# Patient Record
Sex: Female | Born: 1980 | Race: Black or African American | Hispanic: No | Smoking: Never smoker
Health system: Southern US, Community
[De-identification: ages and names within clinical notes are randomized; demographics above are authoritative.]

## PROBLEM LIST (undated history)

## (undated) ENCOUNTER — Inpatient Hospital Stay (HOSPITAL_COMMUNITY): Payer: Self-pay

## (undated) DIAGNOSIS — A539 Syphilis, unspecified: Secondary | ICD-10-CM

## (undated) DIAGNOSIS — N83209 Unspecified ovarian cyst, unspecified side: Secondary | ICD-10-CM

## (undated) DIAGNOSIS — B009 Herpesviral infection, unspecified: Secondary | ICD-10-CM

## (undated) DIAGNOSIS — K922 Gastrointestinal hemorrhage, unspecified: Secondary | ICD-10-CM

## (undated) DIAGNOSIS — J40 Bronchitis, not specified as acute or chronic: Secondary | ICD-10-CM

## (undated) DIAGNOSIS — R519 Headache, unspecified: Secondary | ICD-10-CM

## (undated) DIAGNOSIS — Z8619 Personal history of other infectious and parasitic diseases: Secondary | ICD-10-CM

## (undated) DIAGNOSIS — I10 Essential (primary) hypertension: Secondary | ICD-10-CM

## (undated) DIAGNOSIS — R011 Cardiac murmur, unspecified: Secondary | ICD-10-CM

## (undated) DIAGNOSIS — E119 Type 2 diabetes mellitus without complications: Secondary | ICD-10-CM

## (undated) DIAGNOSIS — Z98891 History of uterine scar from previous surgery: Secondary | ICD-10-CM

## (undated) DIAGNOSIS — O24419 Gestational diabetes mellitus in pregnancy, unspecified control: Secondary | ICD-10-CM

## (undated) DIAGNOSIS — R51 Headache: Secondary | ICD-10-CM

## (undated) DIAGNOSIS — Z8632 Personal history of gestational diabetes: Secondary | ICD-10-CM

## (undated) HISTORY — DX: Herpesviral infection, unspecified: B00.9

## (undated) HISTORY — PX: WISDOM TOOTH EXTRACTION: SHX21

## (undated) HISTORY — DX: Essential (primary) hypertension: I10

## (undated) HISTORY — PX: LIPOSUCTION: SHX10

## (undated) HISTORY — DX: History of uterine scar from previous surgery: Z98.891

## (undated) HISTORY — DX: Personal history of other infectious and parasitic diseases: Z86.19

## (undated) HISTORY — PX: COSMETIC SURGERY: SHX468

## (undated) HISTORY — PX: FOOT SURGERY: SHX648

## (undated) HISTORY — DX: Personal history of gestational diabetes: Z86.32

## (undated) HISTORY — PX: TONSILLECTOMY: SUR1361

---

## 2001-04-27 ENCOUNTER — Emergency Department (HOSPITAL_COMMUNITY): Admission: EM | Admit: 2001-04-27 | Discharge: 2001-04-27 | Payer: Self-pay | Admitting: *Deleted

## 2001-06-01 ENCOUNTER — Encounter: Payer: Self-pay | Admitting: *Deleted

## 2001-06-01 ENCOUNTER — Ambulatory Visit (HOSPITAL_COMMUNITY): Admission: RE | Admit: 2001-06-01 | Discharge: 2001-06-01 | Payer: Self-pay | Admitting: Neonatology

## 2005-08-04 ENCOUNTER — Observation Stay (HOSPITAL_COMMUNITY): Admission: AD | Admit: 2005-08-04 | Discharge: 2005-08-05 | Payer: Self-pay | Admitting: Obstetrics and Gynecology

## 2005-08-13 ENCOUNTER — Emergency Department (HOSPITAL_COMMUNITY): Admission: EM | Admit: 2005-08-13 | Discharge: 2005-08-13 | Payer: Self-pay | Admitting: Emergency Medicine

## 2005-08-16 ENCOUNTER — Observation Stay (HOSPITAL_COMMUNITY): Admission: AD | Admit: 2005-08-16 | Discharge: 2005-08-17 | Payer: Self-pay | Admitting: Obstetrics and Gynecology

## 2005-08-21 ENCOUNTER — Ambulatory Visit (HOSPITAL_COMMUNITY): Admission: AD | Admit: 2005-08-21 | Discharge: 2005-08-21 | Payer: Self-pay | Admitting: Obstetrics and Gynecology

## 2005-09-01 ENCOUNTER — Inpatient Hospital Stay (HOSPITAL_COMMUNITY): Admission: RE | Admit: 2005-09-01 | Discharge: 2005-09-03 | Payer: Self-pay | Admitting: Obstetrics and Gynecology

## 2005-09-08 ENCOUNTER — Emergency Department (HOSPITAL_COMMUNITY): Admission: EM | Admit: 2005-09-08 | Discharge: 2005-09-08 | Payer: Self-pay | Admitting: Emergency Medicine

## 2008-09-20 ENCOUNTER — Emergency Department: Payer: Self-pay | Admitting: Unknown Physician Specialty

## 2010-10-10 ENCOUNTER — Emergency Department: Payer: Self-pay | Admitting: Emergency Medicine

## 2011-03-23 ENCOUNTER — Emergency Department (HOSPITAL_COMMUNITY): Payer: Self-pay

## 2011-03-23 ENCOUNTER — Encounter: Payer: Self-pay | Admitting: *Deleted

## 2011-03-23 ENCOUNTER — Emergency Department (HOSPITAL_COMMUNITY)
Admission: EM | Admit: 2011-03-23 | Discharge: 2011-03-24 | Disposition: A | Discharge status: Home / Self Care | Payer: Self-pay | Attending: Emergency Medicine | Admitting: Emergency Medicine

## 2011-03-23 DIAGNOSIS — R058 Other specified cough: Secondary | ICD-10-CM

## 2011-03-23 DIAGNOSIS — N939 Abnormal uterine and vaginal bleeding, unspecified: Secondary | ICD-10-CM

## 2011-03-23 DIAGNOSIS — J45909 Unspecified asthma, uncomplicated: Secondary | ICD-10-CM | POA: Insufficient documentation

## 2011-03-23 DIAGNOSIS — R059 Cough, unspecified: Secondary | ICD-10-CM | POA: Insufficient documentation

## 2011-03-23 DIAGNOSIS — R05 Cough: Secondary | ICD-10-CM | POA: Insufficient documentation

## 2011-03-23 DIAGNOSIS — N898 Other specified noninflammatory disorders of vagina: Secondary | ICD-10-CM | POA: Insufficient documentation

## 2011-03-23 DIAGNOSIS — R109 Unspecified abdominal pain: Secondary | ICD-10-CM | POA: Insufficient documentation

## 2011-03-23 HISTORY — DX: Gastrointestinal hemorrhage, unspecified: K92.2

## 2011-03-23 LAB — URINE MICROSCOPIC-ADD ON

## 2011-03-23 LAB — BASIC METABOLIC PANEL
BUN: 7 mg/dL (ref 6–23)
CO2: 21 mEq/L (ref 19–32)
Calcium: 9.5 mg/dL (ref 8.4–10.5)
Chloride: 101 mEq/L (ref 96–112)
Creatinine, Ser: 0.69 mg/dL (ref 0.50–1.10)
GFR calc Af Amer: >60 mL/min (ref >60)
GFR calc non Af Amer: >60 mL/min (ref >60)
Glucose, Bld: 117 mg/dL — ABNORMAL HIGH (ref 70–99)
Potassium: 3.4 mEq/L — ABNORMAL LOW (ref 3.5–5.1)
Sodium: 135 mEq/L (ref 135–145)

## 2011-03-23 LAB — DIFFERENTIAL
Basophils Absolute: 0 10*3/uL (ref 0.0–0.1)
Basophils Relative: 1 % (ref 0–1)
Eosinophils Absolute: 0.2 10*3/uL (ref 0.0–0.7)
Eosinophils Relative: 6 % — ABNORMAL HIGH (ref 0–5)
Lymphocytes Relative: 37 % (ref 12–46)
Lymphs Abs: 1.5 10*3/uL (ref 0.7–4.0)
Monocytes Absolute: 0.2 10*3/uL (ref 0.1–1.0)
Monocytes Relative: 4 % (ref 3–12)
Neutro Abs: 2.1 10*3/uL (ref 1.7–7.7)
Neutrophils Relative %: 52 % (ref 43–77)

## 2011-03-23 LAB — WET PREP, GENITAL
Clue Cells Wet Prep HPF POC: NONE SEEN
Trich, Wet Prep: NONE SEEN
Yeast Wet Prep HPF POC: NONE SEEN

## 2011-03-23 LAB — URINALYSIS, ROUTINE W REFLEX MICROSCOPIC
Bilirubin Urine: NEGATIVE
Glucose, UA: NEGATIVE mg/dL
Ketones, ur: NEGATIVE mg/dL
Leukocytes, UA: NEGATIVE
Nitrite: NEGATIVE
Specific Gravity, Urine: >1.03 — ABNORMAL HIGH (ref 1.005–1.030)
Urobilinogen, UA: 0.2 mg/dL (ref 0.0–1.0)
pH: 5.5 (ref 5.0–8.0)

## 2011-03-23 LAB — CBC
HCT: 36.7 % (ref 36.0–46.0)
Hemoglobin: 12.1 g/dL (ref 12.0–15.0)
MCH: 30 pg (ref 26.0–34.0)
MCHC: 33 g/dL (ref 30.0–36.0)
MCV: 91.1 fL (ref 78.0–100.0)
Platelets: 245 10*3/uL (ref 150–400)
RBC: 4.03 MIL/uL (ref 3.87–5.11)
RDW: 13.8 % (ref 11.5–15.5)
WBC: 4 10*3/uL (ref 4.0–10.5)

## 2011-03-23 LAB — POCT PREGNANCY, URINE: Preg Test, Ur: NEGATIVE

## 2011-03-23 NOTE — ED Notes (Signed)
C/o cough since June; was seen and tx by PCP for same previously and dx with bronchitis; pt denies change in s/s; a&ox4; in no distress; respirations, even, non-labored

## 2011-03-24 MED ORDER — PREDNISONE 10 MG PO TABS: ORAL_TABLET | ORAL | Status: AC

## 2011-03-24 MED ORDER — ALBUTEROL SULFATE HFA 108 (90 BASE) MCG/ACT IN AERS: 2.0000 | INHALATION_SPRAY | RESPIRATORY_TRACT | Status: DC | PRN

## 2011-03-24 NOTE — ED Provider Notes (Signed)
History     CSN: [id number] Arrival date & time: 03/23/2011  7:22 PM  Chief Complaint  Patient presents with  . Cough  . Abdominal Pain  . Vaginal Bleeding   HPI Comments: Patient c/o persistent cough for more than one month.  States the cough is intermittently productive of slightly yellow muscus.  Took antibiotic at onset of symptoms w/o improvement.  She denies fever or shortness of breath.  She also c/o intermittent vagainal bleeding this month.  Describes the bleeding as similar to her normal period but has occurred three times in July.  States that she has an IUD in place that was due to be removed in February.  She denies vagainal d/c, new sexual partners or dysuria  Patient is a 30 y.o. female presenting with cough, abdominal pain, and vaginal bleeding. The history is provided by the patient.  Cough This is a chronic problem. The current episode started more than 1 week ago. The problem occurs constantly. The problem has not changed since onset.The cough is productive of sputum. There has been no fever. Pertinent negatives include no chest pain, no chills, no ear congestion, no ear pain, no headaches, no sore throat, no myalgias, no shortness of breath and no wheezing. Treatments tried: antibiotic. The treatment provided no relief. Her past medical history is significant for bronchitis and asthma. Her past medical history does not include pneumonia, COPD or emphysema.  Abdominal Pain The primary symptoms of the illness include abdominal pain and vaginal bleeding. The primary symptoms of the illness do not include fever, fatigue, shortness of breath, nausea, vomiting, diarrhea, dysuria or vaginal discharge.  Symptoms associated with the illness do not include chills or constipation.  Vaginal Bleeding This is a recurrent problem. The current episode started 1 to 4 weeks ago. The problem occurs intermittently. The problem has been unchanged. Associated symptoms include abdominal pain and  coughing. Pertinent negatives include no arthralgias, chest pain, chills, congestion, fatigue, fever, headaches, myalgias, nausea, neck pain, numbness, sore throat, vomiting or weakness. The symptoms are aggravated by nothing. She has tried nothing for the symptoms. The treatment provided no relief.    Past Medical History  Diagnosis Date  . GI bleeding   . Asthma     Past Surgical History  Procedure Date  . Tonsillectomy   . Cesarean section     History reviewed. No pertinent family history.  History  Substance Use Topics  . Smoking status: Never Smoker   . Smokeless tobacco: Not on file  . Alcohol Use: Yes     occasionally    OB History    Grav Para Term Preterm Abortions TAB SAB Ect Mult Living                  Review of Systems  Constitutional: Negative for fever, chills, appetite change and fatigue.  HENT: Negative for ear pain, congestion, sore throat, trouble swallowing, neck pain and neck stiffness.   Respiratory: Positive for cough. Negative for chest tightness, shortness of breath and wheezing.   Cardiovascular: Negative.  Negative for chest pain.  Gastrointestinal: Positive for abdominal pain. Negative for nausea, vomiting, diarrhea and constipation.  Genitourinary: Positive for vaginal bleeding. Negative for dysuria, flank pain, vaginal discharge, genital sores, vaginal pain and pelvic pain.  Musculoskeletal: Negative.  Negative for myalgias and arthralgias.  Skin: Negative.   Neurological: Negative for dizziness, facial asymmetry, weakness, numbness and headaches.  Hematological: Does not bruise/bleed easily.    Physical Exam  BP 121/68  Pulse 73  Temp(Src) 98 F (36.7 C) (Oral)  Resp 14  Ht 5' (1.524 m)  Wt 187 lb (84.823 kg)  BMI 36.52 kg/m2  SpO2 98%  LMP 03/22/2011  Physical Exam  Nursing note and vitals reviewed. Constitutional: She is oriented to person, place, and time. She appears well-developed and well-nourished. No distress.  HENT:    Head: Normocephalic and atraumatic.  Right Ear: External ear normal.  Left Ear: External ear normal.  Mouth/Throat: Oropharynx is clear and moist.  Eyes: EOM are normal. Pupils are equal, round, and reactive to light.  Neck: Normal range of motion. Neck supple. No thyromegaly present.  Cardiovascular: Normal rate, regular rhythm and normal heart sounds.   Pulmonary/Chest: Effort normal and breath sounds normal.  Abdominal: Soft. Bowel sounds are normal. She exhibits no distension and no mass. There is no tenderness. There is no rebound and no guarding.  Genitourinary: Uterus normal. Cervix exhibits no motion tenderness, no discharge and no friability. Right adnexum displays no mass and no tenderness. Left adnexum displays no mass and no tenderness. There is bleeding around the vagina. No tenderness around the vagina. No foreign body around the vagina. No signs of injury around the vagina. No vaginal discharge found.    Musculoskeletal: She exhibits no edema and no tenderness.  Lymphadenopathy:    She has no cervical adenopathy.  Neurological: She is alert and oriented to person, place, and time. She exhibits normal muscle tone. Coordination normal.  Skin: Skin is warm and dry.  Psychiatric: She has a normal mood and affect.    ED Course  Procedures  MDM   0005  Patient is stable, NAD.  Abd remains soft, NT on exam.  Intermittent vaginal bleeding w/o significant blood loss.  Vitals stable.  Urine, GC and chlamydia cultures are pending.  She is pass due for removal of her IUD.  I have reviewed the lab results and CXR and discussed them with the patient, she agrees to f/u with Dr. Emelda Fear regarding the IUD removal.    Results for orders placed during the hospital encounter of 03/23/11  CBC      Component Value Range   WBC 4.0  4.0 - 10.5 (K/uL)   RBC 4.03  3.87 - 5.11 (MIL/uL)   Hemoglobin 12.1  12.0 - 15.0 (g/dL)   HCT 16.1  09.6 - 04.5 (%)   MCV 91.1  78.0 - 100.0 (fL)   MCH  30.0  26.0 - 34.0 (pg)   MCHC 33.0  30.0 - 36.0 (g/dL)   RDW 40.9  81.1 - 91.4 (%)   Platelets 245  150 - 400 (K/uL)  DIFFERENTIAL      Component Value Range   Neutrophils Relative 52  43 - 77 (%)   Neutro Abs 2.1  1.7 - 7.7 (K/uL)   Lymphocytes Relative 37  12 - 46 (%)   Lymphs Abs 1.5  0.7 - 4.0 (K/uL)   Monocytes Relative 4  3 - 12 (%)   Monocytes Absolute 0.2  0.1 - 1.0 (K/uL)   Eosinophils Relative 6 (*) 0 - 5 (%)   Eosinophils Absolute 0.2  0.0 - 0.7 (K/uL)   Basophils Relative 1  0 - 1 (%)   Basophils Absolute 0.0  0.0 - 0.1 (K/uL)  BASIC METABOLIC PANEL      Component Value Range   Sodium 135  135 - 145 (mEq/L)   Potassium 3.4 (*) 3.5 - 5.1 (mEq/L)   Chloride 101  96 - 112 (  mEq/L)   CO2 21  19 - 32 (mEq/L)   Glucose, Bld 117 (*) 70 - 99 (mg/dL)   BUN 7  6 - 23 (mg/dL)   Creatinine, Ser 1.19  0.50 - 1.10 (mg/dL)   Calcium 9.5  8.4 - 14.7 (mg/dL)   GFR calc non Af Amer >60  >60 (mL/min)   GFR calc Af Amer >60  >60 (mL/min)  URINALYSIS, ROUTINE W REFLEX MICROSCOPIC      Component Value Range   Color, Urine YELLOW  YELLOW    Appearance HAZY (*) CLEAR    Specific Gravity, Urine >1.030 (*) 1.005 - 1.030    pH 5.5  5.0 - 8.0    Glucose, UA NEGATIVE  NEGATIVE (mg/dL)   Hgb urine dipstick LARGE (*) NEGATIVE    Bilirubin Urine NEGATIVE  NEGATIVE    Ketones, ur NEGATIVE  NEGATIVE (mg/dL)   Protein, ur TRACE (*) NEGATIVE (mg/dL)   Urobilinogen, UA 0.2  0.0 - 1.0 (mg/dL)   Nitrite NEGATIVE  NEGATIVE    Leukocytes, UA NEGATIVE  NEGATIVE   POCT PREGNANCY, URINE      Component Value Range   Preg Test, Ur NEGATIVE    URINE MICROSCOPIC-ADD ON      Component Value Range   Squamous Epithelial / LPF MANY (*) RARE    WBC, UA 0-2  <3 (WBC/hpf)   RBC / HPF 7-10  <3 (RBC/hpf)   Bacteria, UA MANY (*) RARE   WET PREP, GENITAL      Component Value Range   Yeast, Wet Prep NONE SEEN  NONE SEEN    Trich, Wet Prep NONE SEEN  NONE SEEN    Clue Cells, Wet Prep NONE SEEN  NONE SEEN     WBC, Wet Prep HPF POC FEW (*) NONE SEEN      Dg Chest 2 View  03/23/2011  *RADIOLOGY REPORT*  Clinical Data: Cough  CHEST - 2 VIEW  Comparison: None.  Findings: Heart size is upper normal.  Negative for heart failure. Lungs are clear without infiltrate or effusion.  IMPRESSION: No active cardiopulmonary disease.  Original Report Authenticated By: Camelia Phenes, M.D.         Azariyah Luhrs L. [place], [place] 03/24/11 [phone]

## 2011-03-24 NOTE — ED Notes (Signed)
No needs voiced at this time. Pt reports a soreness in chest from coughing is the only discomfort at this time.

## 2011-03-25 LAB — GC/CHLAMYDIA PROBE AMP, GENITAL
Chlamydia, DNA Probe: NEGATIVE
GC Probe Amp, Genital: NEGATIVE

## 2011-03-25 LAB — URINE CULTURE
Colony Count: 30000
Culture  Setup Time: 201208021439

## 2011-04-01 NOTE — ED Provider Notes (Signed)
History     CSN: [id number] Arrival date & time: 03/23/2011  7:22 PM  Chief Complaint  Patient presents with  . Cough  . Abdominal Pain  . Vaginal Bleeding   HPI  Past Medical History  Diagnosis Date  . GI bleeding   . Asthma     Past Surgical History  Procedure Date  . Tonsillectomy   . Cesarean section     History reviewed. No pertinent family history.  History  Substance Use Topics  . Smoking status: Never Smoker   . Smokeless tobacco: Not on file  . Alcohol Use: Yes     occasionally    OB History    Grav Para Term Preterm Abortions TAB SAB Ect Mult Living                  Review of Systems  Physical Exam  BP 121/68  Pulse 73  Temp(Src) 98 F (36.7 C) (Oral)  Resp 14  Ht 5' (1.524 m)  Wt 187 lb (84.823 kg)  BMI 36.52 kg/m2  SpO2 98%  LMP 03/22/2011  Physical Exam  ED Course  Procedures  MDM No att. providers found   No att. providers found The patient was evaluated in my presence.   I was available for consultation at all times  Doug Sou, MD 04/01/11 1502

## 2011-04-16 NOTE — ED Provider Notes (Signed)
Medical screening examination/treatment/procedure(s) were performed by non-physician practitioner and as supervising physician I was immediately available for consultation/collaboration.  Doug Sou, MD 04/16/11 [phone]

## 2011-11-11 ENCOUNTER — Other Ambulatory Visit (HOSPITAL_COMMUNITY)
Admission: RE | Admit: 2011-11-11 | Discharge: 2011-11-11 | Disposition: A | Discharge status: Home / Self Care | Payer: Self-pay | Source: Ambulatory Visit | Attending: Obstetrics & Gynecology | Admitting: Obstetrics & Gynecology

## 2011-11-11 DIAGNOSIS — Z01419 Encounter for gynecological examination (general) (routine) without abnormal findings: Secondary | ICD-10-CM | POA: Insufficient documentation

## 2012-02-09 ENCOUNTER — Emergency Department (HOSPITAL_COMMUNITY)
Admission: EM | Admit: 2012-02-09 | Discharge: 2012-02-09 | Disposition: A | Discharge status: Home / Self Care | Payer: PRIVATE HEALTH INSURANCE | Attending: Emergency Medicine | Admitting: Emergency Medicine

## 2012-02-09 ENCOUNTER — Encounter (HOSPITAL_COMMUNITY): Payer: Self-pay | Admitting: Emergency Medicine

## 2012-02-09 DIAGNOSIS — J45909 Unspecified asthma, uncomplicated: Secondary | ICD-10-CM | POA: Insufficient documentation

## 2012-02-09 DIAGNOSIS — L03019 Cellulitis of unspecified finger: Secondary | ICD-10-CM | POA: Insufficient documentation

## 2012-02-09 DIAGNOSIS — R030 Elevated blood-pressure reading, without diagnosis of hypertension: Secondary | ICD-10-CM | POA: Insufficient documentation

## 2012-02-09 DIAGNOSIS — L02519 Cutaneous abscess of unspecified hand: Secondary | ICD-10-CM | POA: Insufficient documentation

## 2012-02-09 DIAGNOSIS — I10 Essential (primary) hypertension: Secondary | ICD-10-CM

## 2012-02-09 DIAGNOSIS — Z91040 Latex allergy status: Secondary | ICD-10-CM | POA: Insufficient documentation

## 2012-02-09 MED ORDER — IBUPROFEN 800 MG PO TABS
800.0000 mg | ORAL_TABLET | Freq: Once | ORAL | Status: AC
  Administered 2012-02-09: 800 mg via ORAL
  Filled 2012-02-09: qty 1

## 2012-02-09 MED ORDER — AMOXICILLIN-POT CLAVULANATE 875-125 MG PO TABS: 1.0000 | ORAL_TABLET | Freq: Two times a day (BID) | ORAL | Status: AC

## 2012-02-09 MED ORDER — AMOXICILLIN-POT CLAVULANATE 875-125 MG PO TABS
1.0000 | ORAL_TABLET | Freq: Once | ORAL | Status: AC
  Administered 2012-02-09: 1 via ORAL
  Filled 2012-02-09: qty 1

## 2012-02-09 MED ORDER — HYDROCHLOROTHIAZIDE 25 MG PO TABS: 25.0000 mg | ORAL_TABLET | Freq: Every day | ORAL | Status: DC

## 2012-02-09 MED ORDER — MELOXICAM 7.5 MG PO TABS: ORAL_TABLET | ORAL | Status: DC

## 2012-02-09 MED ORDER — MINOCYCLINE HCL 100 MG PO CAPS: 100.0000 mg | ORAL_CAPSULE | Freq: Two times a day (BID) | ORAL | Status: AC

## 2012-02-09 MED ORDER — ONDANSETRON HCL 4 MG PO TABS
4.0000 mg | ORAL_TABLET | Freq: Once | ORAL | Status: AC
  Administered 2012-02-09: 4 mg via ORAL
  Filled 2012-02-09: qty 1

## 2012-02-09 MED ORDER — HYDROCODONE-ACETAMINOPHEN 7.5-325 MG PO TABS: 1.0000 | ORAL_TABLET | ORAL | Status: AC | PRN

## 2012-02-09 MED ORDER — DOXYCYCLINE HYCLATE 100 MG PO TABS
100.0000 mg | ORAL_TABLET | Freq: Once | ORAL | Status: AC
  Administered 2012-02-09: 100 mg via ORAL
  Filled 2012-02-09: qty 1

## 2012-02-09 NOTE — ED Notes (Signed)
Pt complaining of right hand and arm swelling. Redness noted to right 5th metacarpal.

## 2012-02-09 NOTE — ED Notes (Signed)
Pt alert & oriented x4, stable gait. Pt given discharge instructions, paperwork & prescription(s). Patient instructed to stop at the registration desk to finish any additional paperwork. pt verbalized understanding. Pt left department w/ no further questions.  

## 2012-02-09 NOTE — ED Provider Notes (Signed)
History     CSN: [id number]  Arrival date & time 02/09/12  1846   First MD Initiated Contact with Patient 02/09/12 1957      Chief Complaint  Patient presents with  . Arm Swelling    (Consider location/radiation/quality/duration/timing/severity/associated sxs/prior treatment) HPI Comments: Patient states she had a lesion on the right forearm approximately 2-3 weeks ago. She states she tried home remedies and it gradually went away. A second area came up above that first area that also went away pretty much on its own. In the past week she has noted a reddened area on the right fifth finger that is gotten a little worse than when it first came up. It is now painful and she has some pain that is going down the right arm. She has not had fever or chills. She is not dropping objects or having problems controlling her pain. But she does have some pain and discomfort.  The history is provided by the patient.    Past Medical History  Diagnosis Date  . GI bleeding   . Asthma     Past Surgical History  Procedure Date  . Tonsillectomy   . Cesarean section   . Foot surgery     History reviewed. No pertinent family history.  History  Substance Use Topics  . Smoking status: Never Smoker   . Smokeless tobacco: Not on file  . Alcohol Use: Yes     occasionally    OB History    Grav Para Term Preterm Abortions TAB SAB Ect Mult Living                  Review of Systems  Constitutional: Negative for activity change.       All ROS Neg except as noted in HPI  HENT: Negative for nosebleeds and neck pain.   Eyes: Negative for photophobia and discharge.  Respiratory: Positive for wheezing. Negative for cough and shortness of breath.   Cardiovascular: Negative for chest pain and palpitations.  Gastrointestinal: Negative for abdominal pain and blood in stool.  Genitourinary: Negative for dysuria, frequency and hematuria.  Musculoskeletal: Negative for back pain and arthralgias.    Skin: Negative.   Neurological: Negative for dizziness, seizures and speech difficulty.  Psychiatric/Behavioral: Negative for hallucinations and confusion.    Allergies  Latex  Home Medications   Current Outpatient Rx  Name Route Sig Dispense Refill  . AMOXICILLIN-POT CLAVULANATE 875-125 MG PO TABS Oral Take 1 tablet by mouth 2 (two) times daily. 14 tablet 0  . HYDROCODONE-ACETAMINOPHEN 7.5-325 MG PO TABS Oral Take 1 tablet by mouth every 4 (four) hours as needed for pain. 20 tablet 0  . MELOXICAM 7.5 MG PO TABS  1 po bid with food 12 tablet 0  . MINOCYCLINE HCL 100 MG PO CAPS Oral Take 1 capsule (100 mg total) by mouth 2 (two) times daily. 14 capsule 0    BP 171/104  Pulse 91  Temp 98.2 F (36.8 C) (Oral)  Resp 20  Ht 5' (1.524 m)  Wt 192 lb 14.4 oz (87.499 kg)  BMI 37.67 kg/m2  SpO2 100%  LMP 01/29/2012  Physical Exam  Nursing note and vitals reviewed. Constitutional: She is oriented to person, place, and time. She appears well-developed and well-nourished.  Non-toxic appearance.  HENT:  Head: Normocephalic.  Right Ear: Tympanic membrane and external ear normal.  Left Ear: Tympanic membrane and external ear normal.  Eyes: EOM and lids are normal. Pupils are equal, round, and reactive  to light.  Neck: Normal range of motion. Neck supple. Carotid bruit is not present.  Cardiovascular: Normal rate, regular rhythm, normal heart sounds, intact distal pulses and normal pulses.   Pulmonary/Chest: Breath sounds normal. No respiratory distress.  Abdominal: Soft. Bowel sounds are normal. There is no tenderness. There is no guarding.  Musculoskeletal: Normal range of motion.       There is a small abscess with increased redness in the medial aspect of the right fifth finger.  there is pain with range of motion of the right fifth finger. There is soreness with range of motion of the right wrist, particularly pronation and supination. There are well-healed lesions of the mid forearm  on the right. There no palpable nodes in the biceps triceps area, or the axilla. The right arm is warm but not hot.  Lymphadenopathy:       Head (right side): No submandibular adenopathy present.       Head (left side): No submandibular adenopathy present.    She has no cervical adenopathy.  Neurological: She is alert and oriented to person, place, and time. She has normal strength. No cranial nerve deficit or sensory deficit.  Skin: Skin is warm and dry.  Psychiatric: She has a normal mood and affect. Her speech is normal.    ED Course  Procedures (including critical care time)  Labs Reviewed - No data to display No results found.   1. Cellulitis and abscess of finger       MDM  I have reviewed nursing notes, vital signs, and all appropriate lab and imaging results for this patient. Patient has been having problems with abscess areas involving the right hand also on the lower abdomen for the past 3 weeks. She now has one on the right hand this seems to be slow to go away. The patient's vital signs are within normal limits with exception of the blood pressure being elevated at 171/104. Patient treated with Augmentin 875 mg, Norco 7.5 mg for pain #20, Mobic 7.5 mg and Minocin 100 mg. The patient states she has had problems with her blood pressure in the past, she is not on any medication at this time. She has been checking her blood pressure over the past week and it has been elevated. The blood pressure remains elevated tonight 171/104. Patient given a prescription for hydrochlorothiazide 25 mg with instructions to recheck her pressure on tomorrow and to use the medication if she is still elevated. The patient is strongly advised to see her primary physician for management of the blood pressure issue. The patient's mother advised to see her primary physician in the next 2-3 days for recheck of the right hand. She's advised to return to the emergency department if she is unable to see her  primary physician.       Kathie Dike, [place] 02/09/12 2032

## 2012-02-09 NOTE — ED Notes (Signed)
Pt reports a bite two or three days ago. Now finger red & swollen. States had a similar area to the right forearm 2 weeks ago

## 2012-02-09 NOTE — ED Provider Notes (Signed)
Medical screening examination/treatment/procedure(s) were performed by non-physician practitioner and as supervising physician I was immediately available for consultation/collaboration.   Dayton Bailiff, MD 02/09/12 2042

## 2012-02-09 NOTE — Discharge Instructions (Signed)
Please soak the right hand and warm salt water 2 times daily until the abscess and cellulitis resolved. Please use Augmentin, mobic, and minocin   2 times daily with food. Use Norco for pain if needed. This medication may cause drowsiness and constipation, use with caution. Please see your Dr. in 3-4 days for recheck. If you cannot be seen by your Dr. please return to the emergency department for evaluation of your hand. Please return sooner if any fever, additional swelling, or other concerns.

## 2012-09-15 ENCOUNTER — Emergency Department: Payer: Self-pay | Admitting: Emergency Medicine

## 2012-09-24 ENCOUNTER — Encounter (HOSPITAL_COMMUNITY): Payer: Self-pay | Admitting: Emergency Medicine

## 2012-09-24 ENCOUNTER — Emergency Department (HOSPITAL_COMMUNITY)
Admission: EM | Admit: 2012-09-24 | Discharge: 2012-09-24 | Disposition: A | Discharge status: Home / Self Care | Payer: PRIVATE HEALTH INSURANCE | Attending: Emergency Medicine | Admitting: Emergency Medicine

## 2012-09-24 ENCOUNTER — Emergency Department (HOSPITAL_COMMUNITY): Payer: PRIVATE HEALTH INSURANCE

## 2012-09-24 DIAGNOSIS — J40 Bronchitis, not specified as acute or chronic: Secondary | ICD-10-CM | POA: Insufficient documentation

## 2012-09-24 DIAGNOSIS — K922 Gastrointestinal hemorrhage, unspecified: Secondary | ICD-10-CM | POA: Insufficient documentation

## 2012-09-24 DIAGNOSIS — J45909 Unspecified asthma, uncomplicated: Secondary | ICD-10-CM | POA: Insufficient documentation

## 2012-09-24 DIAGNOSIS — J9801 Acute bronchospasm: Secondary | ICD-10-CM | POA: Insufficient documentation

## 2012-09-24 HISTORY — DX: Bronchitis, not specified as acute or chronic: J40

## 2012-09-24 MED ORDER — ALBUTEROL SULFATE (5 MG/ML) 0.5% IN NEBU
5.0000 mg | INHALATION_SOLUTION | Freq: Once | RESPIRATORY_TRACT | Status: AC
  Administered 2012-09-24: 5 mg via RESPIRATORY_TRACT
  Filled 2012-09-24: qty 1

## 2012-09-24 MED ORDER — PREDNISONE 10 MG PO TABS: 20.0000 mg | ORAL_TABLET | Freq: Every day | ORAL | Status: DC

## 2012-09-24 MED ORDER — IPRATROPIUM BROMIDE 0.02 % IN SOLN
0.5000 mg | Freq: Once | RESPIRATORY_TRACT | Status: AC
  Administered 2012-09-24: 0.5 mg via RESPIRATORY_TRACT
  Filled 2012-09-24: qty 2.5

## 2012-09-24 MED ORDER — AMOXICILLIN 500 MG PO CAPS: 500.0000 mg | ORAL_CAPSULE | Freq: Three times a day (TID) | ORAL | Status: DC

## 2012-09-24 MED ORDER — FLUCONAZOLE 200 MG PO TABS: ORAL_TABLET | ORAL | Status: DC

## 2012-09-24 MED ORDER — PREDNISONE 10 MG PO TABS
60.0000 mg | ORAL_TABLET | Freq: Once | ORAL | Status: AC
  Administered 2012-09-24: 60 mg via ORAL
  Filled 2012-09-24: qty 1

## 2012-09-24 NOTE — ED Provider Notes (Signed)
History  This chart was scribed for Tina Lennert, MD by Ardeen Jourdain, ED Scribe. This patient was seen in room APA12/APA12 and the patient's care was started at 1012.  CSN: [id number]  Arrival date & time 09/24/12  0921   First MD Initiated Contact with Patient 09/24/12 1012      Chief Complaint  Patient presents with  . Shortness of Breath     Patient is a 32 y.o. female presenting with shortness of breath. The history is provided by the patient. No language interpreter was used.  Shortness of Breath  The current episode started yesterday. Associated symptoms include a fever, cough and shortness of breath. Pertinent negatives include no chest pain. The cough is non-productive. There was no intake of a foreign body. She was not exposed to toxic fumes. She has not inhaled smoke recently. She has had prior hospitalizations. She has had no prior ICU admissions. She has had no prior intubations. Her past medical history is significant for asthma. Her past medical history does not include bronchiolitis. She has been behaving normally.    Tina Wall is a 32 y.o. female who presents to the Emergency Department complaining of SOB. She states she was seen at [facility] last week for a recurrent cough. She states she was diagnosed with bronchitis. She reports being given a steroid, antibiotic and inhaler which she finished as prescribed. She reports using her inhaler at home with no relief.     Past Medical History  Diagnosis Date  . GI bleeding   . Asthma   . Bronchitis     Past Surgical History  Procedure Date  . Tonsillectomy   . Cesarean section   . Foot surgery     Family History  Problem Relation Age of Onset  . Cancer Other   . Diabetes Other     History  Substance Use Topics  . Smoking status: Never Smoker   . Smokeless tobacco: Never Used  . Alcohol Use: Yes     Comment: occasionally    OB History    Grav Para Term Preterm Abortions TAB SAB Ect Mult Living    3 3 3       3       Review of Systems  Constitutional: Positive for fever. Negative for fatigue.  HENT: Negative for congestion, sinus pressure and ear discharge.   Eyes: Negative for discharge.  Respiratory: Positive for cough and shortness of breath.   Cardiovascular: Negative for chest pain.  Gastrointestinal: Negative for abdominal pain and diarrhea.  Genitourinary: Negative for frequency and hematuria.  Musculoskeletal: Negative for back pain.  Skin: Negative for rash.  Neurological: Negative for seizures and headaches.  Hematological: Negative.   Psychiatric/Behavioral: Negative for hallucinations.  All other systems reviewed and are negative.    Allergies  Latex  Home Medications   Current Outpatient Rx  Name  Route  Sig  Dispense  Refill  . HYDROCHLOROTHIAZIDE 25 MG PO TABS   Oral   Take 1 tablet (25 mg total) by mouth daily.   30 tablet   0   . MELOXICAM 7.5 MG PO TABS      1 po bid with food   12 tablet   0     Triage Vitals: BP 142/84  Pulse 101  Temp 98 F (36.7 C)  Resp 18  Ht 5' (1.524 m)  Wt 194 lb (87.998 kg)  BMI 37.89 kg/m2  SpO2 100%  LMP 09/08/2012  Physical Exam  Nursing  note and vitals reviewed. Constitutional: She is oriented to person, place, and time. She appears well-developed and well-nourished. No distress.  HENT:  Head: Normocephalic and atraumatic.  Eyes: Conjunctivae normal and EOM are normal. No scleral icterus.  Neck: Neck supple. No thyromegaly present.  Cardiovascular: Normal rate, regular rhythm and normal heart sounds.  Exam reveals no gallop and no friction rub.   No murmur heard. Pulmonary/Chest: Effort normal. No stridor. She has wheezes. She has no rales. She exhibits no tenderness.       Moderate wheezes bilaterally   Abdominal: Soft. Bowel sounds are normal. She exhibits no distension. There is no tenderness. There is no rebound.  Musculoskeletal: Normal range of motion. She exhibits no edema.   Lymphadenopathy:    She has no cervical adenopathy.  Neurological: She is oriented to person, place, and time. Coordination normal.  Skin: No rash noted. No erythema.  Psychiatric: She has a normal mood and affect. Her behavior is normal.    ED Course  Procedures (including critical care time)  DIAGNOSTIC STUDIES: Oxygen Saturation is 100% on room air, normal by my interpretation.    COORDINATION OF CARE:  10:14 AM: Discussed treatment plan which includes CXR, albuterol and prednisone with pt at bedside and pt agreed to plan.     Results for orders placed during the hospital encounter of 03/23/11  CBC      Component Value Range   WBC 4.0  4.0 - 10.5 K/uL   RBC 4.03  3.87 - 5.11 MIL/uL   Hemoglobin 12.1  12.0 - 15.0 g/dL   HCT 16.1  09.6 - 04.5 %   MCV 91.1  78.0 - 100.0 fL   MCH 30.0  26.0 - 34.0 pg   MCHC 33.0  30.0 - 36.0 g/dL   RDW 40.9  81.1 - 91.4 %   Platelets 245  150 - 400 K/uL  DIFFERENTIAL      Component Value Range   Neutrophils Relative 52  43 - 77 %   Neutro Abs 2.1  1.7 - 7.7 K/uL   Lymphocytes Relative 37  12 - 46 %   Lymphs Abs 1.5  0.7 - 4.0 K/uL   Monocytes Relative 4  3 - 12 %   Monocytes Absolute 0.2  0.1 - 1.0 K/uL   Eosinophils Relative 6 (*) 0 - 5 %   Eosinophils Absolute 0.2  0.0 - 0.7 K/uL   Basophils Relative 1  0 - 1 %   Basophils Absolute 0.0  0.0 - 0.1 K/uL  BASIC METABOLIC PANEL      Component Value Range   Sodium 135  135 - 145 mEq/L   Potassium 3.4 (*) 3.5 - 5.1 mEq/L   Chloride 101  96 - 112 mEq/L   CO2 21  19 - 32 mEq/L   Glucose, Bld 117 (*) 70 - 99 mg/dL   BUN 7  6 - 23 mg/dL   Creatinine, Ser 7.82  0.50 - 1.10 mg/dL   Calcium 9.5  8.4 - 95.6 mg/dL   GFR calc non Af Amer >60  >60 mL/min   GFR calc Af Amer >60  >60 mL/min  URINALYSIS, ROUTINE W REFLEX MICROSCOPIC      Component Value Range   Color, Urine YELLOW  YELLOW   APPearance HAZY (*) CLEAR   Specific Gravity, Urine >1.030 (*) 1.005 - 1.030   pH 5.5  5.0 - 8.0    Glucose, UA NEGATIVE  NEGATIVE mg/dL   Hgb urine dipstick  LARGE (*) NEGATIVE   Bilirubin Urine NEGATIVE  NEGATIVE   Ketones, ur NEGATIVE  NEGATIVE mg/dL   Protein, ur TRACE (*) NEGATIVE mg/dL   Urobilinogen, UA 0.2  0.0 - 1.0 mg/dL   Nitrite NEGATIVE  NEGATIVE   Leukocytes, UA NEGATIVE  NEGATIVE  POCT PREGNANCY, URINE      Component Value Range   Preg Test, Ur NEGATIVE    URINE MICROSCOPIC-ADD ON      Component Value Range   Squamous Epithelial / LPF MANY (*) RARE   WBC, UA 0-2  <3 WBC/hpf   RBC / HPF 7-10  <3 RBC/hpf   Bacteria, UA MANY (*) RARE  URINE CULTURE      Component Value Range   Specimen Description URINE, CLEAN CATCH     Special Requests NONE     Culture  Setup Time [id number]     Colony Count 30,000 COLONIES/ML     Culture       Value: Multiple bacterial morphotypes present, none predominant. Suggest appropriate recollection if clinically indicated.   Report Status 03/25/2011 FINAL    GC/CHLAMYDIA PROBE AMP, GENITAL      Component Value Range   GC Probe Amp, Genital NEGATIVE  NEGATIVE   Chlamydia, DNA Probe NEGATIVE  NEGATIVE  WET PREP, GENITAL      Component Value Range   Yeast Wet Prep HPF POC NONE SEEN  NONE SEEN   Trich, Wet Prep NONE SEEN  NONE SEEN   Clue Cells Wet Prep HPF POC NONE SEEN  NONE SEEN   WBC, Wet Prep HPF POC FEW (*) NONE SEEN   Dg Chest 2 View  09/24/2012  *RADIOLOGY REPORT*  Clinical Data: Cough, shortness of breath.  CHEST - 2 VIEW  Comparison: March 23, 2011.  Findings: Cardiomediastinal silhouette appears normal.  No acute pulmonary disease is noted.  Bony thorax is intact.  IMPRESSION: No acute cardiopulmonary abnormality seen.   Original Report Authenticated By: Lupita Raider.,  M.D.        No diagnosis found.    MDM        The chart was scribed for me under my direct supervision.  I personally performed the history, physical, and medical decision making and all procedures in the evaluation of this  patient.Marland Kitchen }   Tina Lennert, MD 09/25/12 1258

## 2012-09-24 NOTE — ED Notes (Signed)
Patient c/o shortness of breath since last night. Per patient hx asthma. Patient reports being seen at [place] [facility] last week for a cough she has had for a month and diagnosed with bronchitis. Per patient given a steroid, antibiotic and inhaler. Reports using inhaler before coming here with no relief.

## 2012-09-24 NOTE — ED Notes (Signed)
Respiratory called for breathing treatment.

## 2012-12-31 ENCOUNTER — Encounter: Payer: Self-pay | Admitting: *Deleted

## 2012-12-31 DIAGNOSIS — B009 Herpesviral infection, unspecified: Secondary | ICD-10-CM

## 2013-01-01 ENCOUNTER — Encounter: Payer: Self-pay | Admitting: *Deleted

## 2013-01-01 ENCOUNTER — Ambulatory Visit: Payer: Self-pay | Admitting: Obstetrics & Gynecology

## 2014-02-11 ENCOUNTER — Ambulatory Visit: Payer: Self-pay | Admitting: Hematology and Oncology

## 2014-02-11 LAB — CBC CANCER CENTER
Basophil #: 0.1 x10 3/mm (ref 0.0–0.1)
Basophil %: 1.4 %
Eosinophil #: 0.2 x10 3/mm (ref 0.0–0.7)
Eosinophil %: 4.1 %
HCT: 40 % (ref 35.0–47.0)
HGB: 12.9 g/dL (ref 12.0–16.0)
Lymphocyte #: 2.6 x10 3/mm (ref 1.0–3.6)
Lymphocyte %: 62.6 %
MCH: 30.7 pg (ref 26.0–34.0)
MCHC: 32.2 g/dL (ref 32.0–36.0)
MCV: 95 fL (ref 80–100)
Monocyte #: 0.3 x10 3/mm (ref 0.2–0.9)
Monocyte %: 7.1 %
Neutrophil #: 1 x10 3/mm — ABNORMAL LOW (ref 1.4–6.5)
Neutrophil %: 24.8 %
Platelet: 198 x10 3/mm (ref 150–440)
RBC: 4.2 10*6/uL (ref 3.80–5.20)
RDW: 13.3 % (ref 11.5–14.5)
WBC: 4.2 x10 3/mm (ref 3.6–11.0)

## 2014-02-11 LAB — FOLATE: Folic Acid: 7 ng/mL (ref 3.1–100.0)

## 2014-02-19 ENCOUNTER — Ambulatory Visit: Payer: Self-pay | Admitting: Hematology and Oncology

## 2014-06-23 ENCOUNTER — Encounter: Payer: Self-pay | Admitting: *Deleted

## 2014-10-05 ENCOUNTER — Emergency Department: Payer: Self-pay | Admitting: Emergency Medicine

## 2014-10-22 ENCOUNTER — Ambulatory Visit: Payer: Self-pay | Admitting: Obstetrics & Gynecology

## 2014-10-22 ENCOUNTER — Encounter: Payer: Self-pay | Admitting: *Deleted

## 2015-04-02 ENCOUNTER — Ambulatory Visit (INDEPENDENT_AMBULATORY_CARE_PROVIDER_SITE_OTHER): Payer: PRIVATE HEALTH INSURANCE | Admitting: Primary Care

## 2015-04-02 ENCOUNTER — Encounter: Payer: Self-pay | Admitting: Primary Care

## 2015-04-02 ENCOUNTER — Encounter (INDEPENDENT_AMBULATORY_CARE_PROVIDER_SITE_OTHER): Payer: Self-pay

## 2015-04-02 VITALS — BP 146/94 | HR 75 | Temp 98.2°F | Ht 60.5 in | Wt 209.1 lb

## 2015-04-02 DIAGNOSIS — R7309 Other abnormal glucose: Secondary | ICD-10-CM | POA: Diagnosis not present

## 2015-04-02 DIAGNOSIS — R7303 Prediabetes: Secondary | ICD-10-CM

## 2015-04-02 DIAGNOSIS — E1165 Type 2 diabetes mellitus with hyperglycemia: Secondary | ICD-10-CM | POA: Insufficient documentation

## 2015-04-02 DIAGNOSIS — I10 Essential (primary) hypertension: Secondary | ICD-10-CM

## 2015-04-02 DIAGNOSIS — N949 Unspecified condition associated with female genital organs and menstrual cycle: Secondary | ICD-10-CM | POA: Diagnosis not present

## 2015-04-02 LAB — POCT URINALYSIS DIPSTICK
Bilirubin, UA: NEGATIVE
Blood, UA: NEGATIVE
Clarity, UA: NEGATIVE
Glucose, UA: NEGATIVE
Ketones, UA: NEGATIVE
Leukocytes, UA: NEGATIVE
Nitrite, UA: NEGATIVE
Protein, UA: NEGATIVE
Spec Grav, UA: >=1.03
Urobilinogen, UA: NEGATIVE
pH, UA: 5.5

## 2015-04-02 NOTE — Progress Notes (Signed)
Pre visit review using our clinic review tool, if applicable. No additional management support is needed unless otherwise documented below in the visit note. 

## 2015-04-02 NOTE — Patient Instructions (Addendum)
Complete lab work prior to leaving today. I will notify you of your results.  Continue to take the Losartan for the moment. I will call you once I receive the labs which will determine the BP medication.  It is important that you improve your diet. Please limit carbohydrates in the form of white bread, rice, pasta, cakes, cookies, sugary drinks, etc. Increase your consumption of fresh fruits and vegetables. Be sure to drink plenty of water daily.  It was a pleasure to meet you today! Please don't hesitate to call me with any questions. Welcome to [facility]!

## 2015-04-02 NOTE — Progress Notes (Signed)
Subjective:    Patient ID: Tina Wall, female    DOB: 1980-10-14, 34 y.o.   MRN: [id number]  HPI  Tina Wall is a 34 year old female who presents today to establish care and discuss the problems mentioned below. Will obtain old records.  1) Essential Hypertension: Diagnosed 1-2 years ago. She was once managed on losartan and was recently changed to lostartan/HCTZ 100/12.5 in late June. Since starting the new medication she experienced bilateral leg cramping that was bothersome. She ran out of the Hyzaar 1 week ago and has been taking losartan 100 mg. She checks her BP at home and has been getting 140's/90's. She reports headaches. Denies chest pain, shortness of breath, dizziness.  2) Vaginal Discomfort: Diagnosed with BV and was prescribed flagyl 2 weeks ago. She then got a yeast infection from the antibiotics and was provided with diflucan. Since then she continues to feel discomfort. Denies itching, vaginal discharge. She also reports right groin/ovarian pain. She was once told she had a fibroid. Denies bleeding other than cycle.   3) Borderline Diabetes: Diagnosed 1 year ago and is managed on Metformin 500 mg once daily. Last A1C was in late June and is not and is not aware of her results. She has some numbness/tingling to her fingers.   She endorses a poor diet which consists of:  Breakfast: Skips Lunch: [employer], left overs. Dinner: Pasta (alfredo, marinara 4 times weekly), mac and cheese, meat, veggies. Desserts: Eats them once-twice weekly. Beverages: Drinks mostly water, sodas, sweet tea.  Review of Systems  Constitutional: Negative for fever and chills.  HENT: Negative for rhinorrhea.   Respiratory: Negative for cough and shortness of breath.   Cardiovascular: Negative for chest pain.  Gastrointestinal: Negative for abdominal pain.  Genitourinary: Negative for dysuria, frequency and difficulty urinating.       Vaginal foul odor  Musculoskeletal: Negative for myalgias and  arthralgias.       Once experiencing myalgias when on HCTZ  Skin: Negative for rash.  Allergic/Immunologic: Negative for environmental allergies.  Neurological: Positive for numbness and headaches. Negative for dizziness.       Past Medical History  Diagnosis Date  . GI bleeding   . Asthma   . Bronchitis   . Hx of trichomoniasis   . Hx of chlamydia infection   . HSV-2 (herpes simplex virus 2) infection     Social History   Social History  . Marital Status: Single    Spouse Name: N/A  . Number of Children: N/A  . Years of Education: N/A   Occupational History  . Not on file.   Social History Main Topics  . Smoking status: Never Smoker   . Smokeless tobacco: Never Used  . Alcohol Use: Yes     Comment: occasionally  . Drug Use: No  . Sexual Activity: Yes    Birth Control/ Protection: Pill   Other Topics Concern  . Not on file   Social History Narrative    Past Surgical History  Procedure Laterality Date  . Tonsillectomy    . Cesarean section    . Foot surgery      Family History  Problem Relation Age of Onset  . Hypertension Mother   . Diabetes Mother   . Cancer Father     trachea     Allergies  Allergen Reactions  . Ace Inhibitors   . Latex Other (See Comments)    PT IS UNSURE OF WHETHER OR NOT THIS IS AN  ALLERGY    No current outpatient prescriptions on file prior to visit.   No current facility-administered medications on file prior to visit.    BP 146/94 mmHg  Pulse 75  Temp(Src) 98.2 F (36.8 C) (Oral)  Ht 5' 0.5" (1.537 m)  Wt 209 lb 1.9 oz (94.856 kg)  BMI 40.15 kg/m2  SpO2 99%  LMP 03/26/2015    Objective:   Physical Exam  Constitutional: She is oriented to person, place, and time. She appears well-nourished.  Cardiovascular: Normal rate and regular rhythm.   Pulmonary/Chest: Effort normal and breath sounds normal.  Abdominal: Soft. Bowel sounds are normal.  Tender to right groin  Genitourinary: Cervix exhibits discharge.  Cervix exhibits no motion tenderness. Right adnexum displays no tenderness. Left adnexum displays no tenderness. Vaginal discharge found.  Neurological: She is alert and oriented to person, place, and time.  Skin: Skin is warm and dry.  Psychiatric: She has a normal mood and affect.          Assessment & Plan:  Vaginal discomfort:  Present for 2+ weeks. Treated with flagyl then diflucan. Continues to feel discomfort. Wet prep preformed today along with GC/Chlamydia. Specimen pending. +discharge upon exam. Adnexa non tender.

## 2015-04-02 NOTE — Assessment & Plan Note (Addendum)
Currently taking losartan 100 mg. Was once on hyzaar, but unable to tolerate due to cramps. BP not at goal. Will obtain BMP today to check potassium and sodium. Will also obtain A1C. If A1C elevated, then will do amlodipine 5 with low dose ACE/ARB. If A1C WNL then will do Amlodopine 10

## 2015-04-02 NOTE — Assessment & Plan Note (Signed)
Last A1C per patient was in June 2016, unsure of results. A1C today. Will add in ACE/ARB if elevated. Discussed the importance of diabetic diet.

## 2015-04-03 ENCOUNTER — Other Ambulatory Visit: Payer: Self-pay | Admitting: Primary Care

## 2015-04-03 DIAGNOSIS — I10 Essential (primary) hypertension: Secondary | ICD-10-CM

## 2015-04-03 DIAGNOSIS — B9689 Other specified bacterial agents as the cause of diseases classified elsewhere: Secondary | ICD-10-CM

## 2015-04-03 DIAGNOSIS — N760 Acute vaginitis: Secondary | ICD-10-CM

## 2015-04-03 LAB — GC/CHLAMYDIA PROBE AMP
CT Probe RNA: NEGATIVE
GC Probe RNA: NEGATIVE

## 2015-04-03 LAB — BASIC METABOLIC PANEL
BUN: 10 mg/dL (ref 6–23)
CO2: 28 mEq/L (ref 19–32)
Calcium: 9.7 mg/dL (ref 8.4–10.5)
Chloride: 104 mEq/L (ref 96–112)
Creatinine, Ser: 0.99 mg/dL (ref 0.40–1.20)
GFR: 82.85 mL/min (ref >60.00)
Glucose, Bld: 80 mg/dL (ref 70–99)
Potassium: 3.8 mEq/L (ref 3.5–5.1)
Sodium: 139 mEq/L (ref 135–145)

## 2015-04-03 LAB — WET PREP BY MOLECULAR PROBE
Candida species: NEGATIVE
Gardnerella vaginalis: POSITIVE — AB
Trichomonas vaginosis: NEGATIVE

## 2015-04-03 LAB — HEMOGLOBIN A1C: Hgb A1c MFr Bld: 6 % (ref 4.6–6.5)

## 2015-04-03 MED ORDER — METRONIDAZOLE 500 MG PO TABS: 500.0000 mg | ORAL_TABLET | Freq: Two times a day (BID) | ORAL | Status: DC

## 2015-04-03 MED ORDER — AMLODIPINE BESYLATE 5 MG PO TABS: 5.0000 mg | ORAL_TABLET | Freq: Every day | ORAL | Status: DC

## 2015-04-06 MED ORDER — AMLODIPINE BESYLATE 5 MG PO TABS: 5.0000 mg | ORAL_TABLET | Freq: Every day | ORAL | Status: DC

## 2015-04-06 MED ORDER — METRONIDAZOLE 500 MG PO TABS: 500.0000 mg | ORAL_TABLET | Freq: Two times a day (BID) | ORAL | Status: DC

## 2015-04-09 ENCOUNTER — Encounter: Payer: Self-pay | Admitting: *Deleted

## 2015-05-12 ENCOUNTER — Ambulatory Visit (INDEPENDENT_AMBULATORY_CARE_PROVIDER_SITE_OTHER): Payer: PRIVATE HEALTH INSURANCE | Admitting: Primary Care

## 2015-05-12 ENCOUNTER — Encounter: Payer: Self-pay | Admitting: Primary Care

## 2015-05-12 VITALS — BP 128/88 | HR 80 | Temp 98.0°F | Ht 60.5 in | Wt 204.1 lb

## 2015-05-12 DIAGNOSIS — I10 Essential (primary) hypertension: Secondary | ICD-10-CM | POA: Diagnosis not present

## 2015-05-12 NOTE — Progress Notes (Signed)
   Subjective:    Patient ID: Tina Wall, female    DOB: 27-Jul-1981, 34 y.o.   MRN: [id number]  HPI  Tina Wall is a 34 year old female who presents today for follow up of hypertension. She is currently managed on Amlodipine 5 mg tablets which were initiated last visit. She was once on losartan and then switched to losartan 100/12.5 mg by prior PCP. She could not tolerate the HCTZ as she experienced muscle cramping. The losartan had little effect on her BP readings in the past.  Since her last visit she's feeling well overall. Denies chest pain, dizziness, headaches, ankle edema. BP in clinic is improved.  BP Readings from Last 3 Encounters:  05/12/15 128/88  04/02/15 146/94  09/24/12 142/84     Review of Systems  Respiratory: Negative for shortness of breath.   Cardiovascular: Negative for chest pain and leg swelling.  Neurological: Negative for dizziness and headaches.       Past Medical History  Diagnosis Date  . GI bleeding   . Asthma   . Bronchitis   . Hx of trichomoniasis   . Hx of chlamydia infection   . HSV-2 (herpes simplex virus 2) infection     Social History   Social History  . Marital Status: Single    Spouse Name: N/A  . Number of Children: N/A  . Years of Education: N/A   Occupational History  . Not on file.   Social History Main Topics  . Smoking status: Never Smoker   . Smokeless tobacco: Never Used  . Alcohol Use: Yes     Comment: occasionally  . Drug Use: No  . Sexual Activity: Yes    Birth Control/ Protection: Pill   Other Topics Concern  . Not on file   Social History Narrative    Past Surgical History  Procedure Laterality Date  . Tonsillectomy    . Cesarean section    . Foot surgery      Family History  Problem Relation Age of Onset  . Hypertension Mother   . Diabetes Mother   . Cancer Father     trachea     Allergies  Allergen Reactions  . Ace Inhibitors   . Latex Other (See Comments)    PT IS UNSURE OF WHETHER  OR NOT THIS IS AN ALLERGY    Current Outpatient Prescriptions on File Prior to Visit  Medication Sig Dispense Refill  . albuterol (PROAIR HFA) 108 (90 BASE) MCG/ACT inhaler Inhale into the lungs.    Marland Kitchen amLODipine (NORVASC) 5 MG tablet Take 1 tablet (5 mg total) by mouth daily. 30 tablet 3  . metFORMIN (GLUCOPHAGE-XR) 500 MG 24 hr tablet Take by mouth.     No current facility-administered medications on file prior to visit.    BP 128/88 mmHg  Pulse 80  Temp(Src) 98 F (36.7 C) (Oral)  Ht 5' 0.5" (1.537 m)  Wt 204 lb 1.9 oz (92.588 kg)  BMI 39.19 kg/m2  SpO2 98%  LMP 04/22/2015    Objective:   Physical Exam  Constitutional: She appears well-nourished.  Cardiovascular: Normal rate and regular rhythm.   Pulmonary/Chest: Effort normal and breath sounds normal.  Skin: Skin is warm and dry.          Assessment & Plan:

## 2015-05-12 NOTE — Assessment & Plan Note (Signed)
Improved on Amlodipine 5 mg tablets. Denies chest pain, SOB, dizziness, headaches. Continue same. Will continue to monitor.

## 2015-05-12 NOTE — Progress Notes (Signed)
Pre visit review using our clinic review tool, if applicable. No additional management support is needed unless otherwise documented below in the visit note. 

## 2015-05-12 NOTE — Patient Instructions (Addendum)
Your blood pressure looks great! Continue amlodipine tablets for blood pressure.  You may take ibuprofen 400 to 600 mg three times daily as needed for hip and foot pain.  Schedule a follow up appointment after November 11th for re-evaluation of borderline diabetes.  It was a pleasure to see you today!  Hypertension Hypertension, commonly called high blood pressure, is when the force of blood pumping through your arteries is too strong. Your arteries are the blood vessels that carry blood from your heart throughout your body. A blood pressure reading consists of a higher number over a lower number, such as 110/72. The higher number (systolic) is the pressure inside your arteries when your heart pumps. The lower number (diastolic) is the pressure inside your arteries when your heart relaxes. Ideally you want your blood pressure below 120/80. Hypertension forces your heart to work harder to pump blood. Your arteries may become narrow or stiff. Having hypertension puts you at risk for heart disease, stroke, and other problems.  RISK FACTORS Some risk factors for high blood pressure are controllable. Others are not.  Risk factors you cannot control include:   Race. You may be at higher risk if you are African American.  Age. Risk increases with age.  Gender. Men are at higher risk than women before age 63 years. After age 71, women are at higher risk than men. Risk factors you can control include:  Not getting enough exercise or physical activity.  Being overweight.  Getting too much fat, sugar, calories, or salt in your diet.  Drinking too much alcohol. SIGNS AND SYMPTOMS Hypertension does not usually cause signs or symptoms. Extremely high blood pressure (hypertensive crisis) may cause headache, anxiety, shortness of breath, and nosebleed. DIAGNOSIS  To check if you have hypertension, your health care provider will measure your blood pressure while you are seated, with your arm held at  the level of your heart. It should be measured at least twice using the same arm. Certain conditions can cause a difference in blood pressure between your right and left arms. A blood pressure reading that is higher than normal on one occasion does not mean that you need treatment. If one blood pressure reading is high, ask your health care provider about having it checked again. TREATMENT  Treating high blood pressure includes making lifestyle changes and possibly taking medicine. Living a healthy lifestyle can help lower high blood pressure. You may need to change some of your habits. Lifestyle changes may include:  Following the DASH diet. This diet is high in fruits, vegetables, and whole grains. It is low in salt, red meat, and added sugars.  Getting at least 2 hours of brisk physical activity every week.  Losing weight if necessary.  Not smoking.  Limiting alcoholic beverages.  Learning ways to reduce stress. If lifestyle changes are not enough to get your blood pressure under control, your health care provider may prescribe medicine. You may need to take more than one. Work closely with your health care provider to understand the risks and benefits. HOME CARE INSTRUCTIONS  Have your blood pressure rechecked as directed by your health care provider.   Take medicines only as directed by your health care provider. Follow the directions carefully. Blood pressure medicines must be taken as prescribed. The medicine does not work as well when you skip doses. Skipping doses also puts you at risk for problems.   Do not smoke.   Monitor your blood pressure at home as directed by your  health care provider. SEEK MEDICAL CARE IF:   You think you are having a reaction to medicines taken.  You have recurrent headaches or feel dizzy.  You have swelling in your ankles.  You have trouble with your vision. SEEK IMMEDIATE MEDICAL CARE IF:  You develop a severe headache or  confusion.  You have unusual weakness, numbness, or feel faint.  You have severe chest or abdominal pain.  You vomit repeatedly.  You have trouble breathing. MAKE SURE YOU:   Understand these instructions.  Will watch your condition.  Will get help right away if you are not doing well or get worse. Document Released: 08/08/2005 Document Revised: 12/23/2013 Document Reviewed: 05/31/2013 [facility] Patient Information 2015 [place], [place]. This information is not intended to replace advice given to you by your health care provider. Make sure you discuss any questions you have with your health care provider.

## 2015-05-22 ENCOUNTER — Telehealth: Payer: Self-pay | Admitting: *Deleted

## 2015-05-22 NOTE — Telephone Encounter (Signed)
Received refill fax request for   metFORMIN (GLUCOPHAGE-XR) 500 MG 24 hr tablet Take 1 tablet by mouth nightly   Anda Kraft have not prescribed Rx yet. Patient was last seen 05/12/15. No future appt

## 2015-05-22 NOTE — Telephone Encounter (Signed)
Ok to refill with 5 refills added. She needs a lab only appointment for A1C to be scheduled after November 11th. Will you please schedule? Thanks.

## 2015-05-22 NOTE — Telephone Encounter (Signed)
Message left for patient to return my call.  

## 2015-05-25 MED ORDER — METFORMIN HCL ER 500 MG PO TB24: 500.0000 mg | ORAL_TABLET | Freq: Every day | ORAL | Status: DC

## 2015-05-25 NOTE — Telephone Encounter (Signed)
Called and notified patient of Kate's comments. Patient verbalized understanding. Patient will call back to schedule for the lab appt. Sending letter to remind patient to make the lab appt.

## 2015-05-25 NOTE — Addendum Note (Signed)
Addended by: Jacqualin Combes on: 05/25/2015 12:04 PM   Modules accepted: Orders

## 2015-07-08 ENCOUNTER — Encounter: Payer: Self-pay | Admitting: Family Medicine

## 2015-07-08 ENCOUNTER — Ambulatory Visit (INDEPENDENT_AMBULATORY_CARE_PROVIDER_SITE_OTHER): Payer: Managed Care, Other (non HMO) | Admitting: Family Medicine

## 2015-07-08 VITALS — BP 120/76 | HR 66 | Temp 98.7°F | Wt 195.2 lb

## 2015-07-08 DIAGNOSIS — R7303 Prediabetes: Secondary | ICD-10-CM

## 2015-07-08 DIAGNOSIS — R739 Hyperglycemia, unspecified: Secondary | ICD-10-CM | POA: Diagnosis not present

## 2015-07-08 DIAGNOSIS — D179 Benign lipomatous neoplasm, unspecified: Secondary | ICD-10-CM | POA: Diagnosis not present

## 2015-07-08 NOTE — Patient Instructions (Signed)
Likely a lipoma, a small non-cancerous fatty tumor.   You don't have to do anything about it unless it gets a lot bigger or bothersome.  Take care. Glad to see you.

## 2015-07-08 NOTE — Progress Notes (Signed)
Pre visit review using our clinic review tool, if applicable. No additional management support is needed unless otherwise documented below in the visit note.  Knot on her stomach.  Recently noted, a few weeks ago, then again last week.  Not painful unless she "messes with it long enough."  Not red, not warm to touch, R side of abd.  One possible similar lesion like this on L abd wall.  No trigger.  Is about dime sized.  Feels well o/w.    Due for f/u A1c.  Weight is down, working on diet.  See pending labs.    Meds, vitals, and allergies reviewed.   ROS: See HPI.  Otherwise, noncontributory.  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  Small, about size of a dime.  No hernia noted.  Possible similar lesion on L of midline, lower in the abd but still above the umbilicus.

## 2015-07-09 DIAGNOSIS — D179 Benign lipomatous neoplasm, unspecified: Secondary | ICD-10-CM | POA: Insufficient documentation

## 2015-07-09 LAB — HEMOGLOBIN A1C: Hgb A1c MFr Bld: 5.7 % (ref 4.6–6.5)

## 2015-07-09 NOTE — Assessment & Plan Note (Signed)
Encouraged work on diet and exercise for weight, see notes on labs.

## 2015-07-09 NOTE — Assessment & Plan Note (Signed)
Likely, d/w pt.  Benign exam o/w.  Okay for outpatient f/u.   Anatomy/Path/phys of lipoma d/w pt.  Update [place] as needed.

## 2015-07-14 ENCOUNTER — Ambulatory Visit: Payer: PRIVATE HEALTH INSURANCE | Admitting: Primary Care

## 2015-07-15 ENCOUNTER — Encounter: Payer: Self-pay | Admitting: *Deleted

## 2015-07-20 ENCOUNTER — Telehealth: Payer: Self-pay | Admitting: Primary Care

## 2015-07-20 NOTE — Telephone Encounter (Addendum)
Called patient and notified her that Anda Kraft would like her to come in 6 months for a follow up for BP. Patient verbalized understanding. Patient will call back later to schedule the follow up.

## 2015-07-20 NOTE — Telephone Encounter (Signed)
Pt returned your call.  

## 2015-09-21 ENCOUNTER — Encounter: Payer: Self-pay | Admitting: Obstetrics & Gynecology

## 2015-09-21 ENCOUNTER — Ambulatory Visit (INDEPENDENT_AMBULATORY_CARE_PROVIDER_SITE_OTHER): Payer: Managed Care, Other (non HMO) | Admitting: Obstetrics & Gynecology

## 2015-09-21 ENCOUNTER — Ambulatory Visit: Payer: Self-pay | Admitting: Obstetrics and Gynecology

## 2015-09-21 VITALS — BP 120/64 | HR 77 | Ht 60.5 in | Wt 202.8 lb

## 2015-09-21 DIAGNOSIS — R1032 Left lower quadrant pain: Secondary | ICD-10-CM

## 2015-09-21 DIAGNOSIS — R1031 Right lower quadrant pain: Secondary | ICD-10-CM

## 2015-09-21 MED ORDER — PIROXICAM 20 MG PO CAPS: 20.0000 mg | ORAL_CAPSULE | Freq: Every day | ORAL | Status: DC

## 2015-09-21 NOTE — Progress Notes (Signed)
Patient ID: Tina Wall, female   DOB: Dec 20, 1980, 35 y.o.   MRN: [id number]      Chief Complaint  Patient presents with  . pain ovary area x 6 months    Blood pressure 120/64, pulse 77, height 5' 0.5" (1.537 m), weight 202 lb 12.8 oz (91.989 kg), last menstrual period 09/09/2015.  35 y.o. G3P3003 Patient's last menstrual period was 09/09/2015. The current method of family planning is tubal ligation.  Subjective Has 1-2 days of bilateral lower abdominal pain every month about 1 week or so after menses. Periods have not chnaged In area of previous Caesarean section incision, responds to tylenol  Objective Abdomen soft + tender in area of Csection incision just above pubis bilaterlly No CMT Uterus NSSC NT Adnexa negative  Pertinent ROS No burning with urination, frequency or urgency No nausea, vomiting or diarrhea Nor fever chills or other constitutional symptoms   Labs or studies     Impression Diagnoses this Encounter::   ICD-9-CM ICD-10-CM   1. Abdominal wall pain in both lower quadrants 789.03 R10.31    789.04 R10.32     Established relevant diagnosis(es):   Plan/Recommendations: No orders of the defined types were placed in this encounter.    Labs or Scans Ordered: No orders of the defined types were placed in this encounter.    Management:: Trigger Point Injection   Pre-operative diagnosis: myofascial pain  Post-operative diagnosis: myofascial pain  After risks and benefits were explained including bleeding, infection, worsening of the pain, damage to the area being injected, weakness, allergic reaction to medications, vascular injection, and nerve damage, signed consent was obtained.  All questions were answered.    The area of the trigger point was identified and the skin prepped three times with alcohol and the alcohol allowed to dry.  Next, a 25 gauge 0.5 inch needle was placed in the area of the trigger point.  Once reproduction of the pain was  elicited and negative aspiration confirmed, the trigger point was injected and the needle removed.    The patient did tolerate the procedure well and there were not complications.    Medication used: 20 cc 0.5% marcaine bilaterally    Trigger points injected: 2    Trigger point(s) location(s):  bilateral   Responded to the injections today  Follow up 2 weeks   sonogram     All questions were answered.

## 2015-09-22 ENCOUNTER — Ambulatory Visit: Payer: Self-pay | Admitting: Obstetrics & Gynecology

## 2015-10-05 ENCOUNTER — Other Ambulatory Visit: Payer: Self-pay | Admitting: Primary Care

## 2015-10-05 DIAGNOSIS — I10 Essential (primary) hypertension: Secondary | ICD-10-CM

## 2015-10-05 NOTE — Telephone Encounter (Signed)
Electronically refill request for   amLODipine (NORVASC) 5 MG tablet   Take 1 tablet (5 mg total) by mouth daily.  Dispense: 30 tablet   Refills: 3     Last prescribed on 04/06/2015. Last seen on 07/08/2015. No future appt.

## 2015-10-08 ENCOUNTER — Ambulatory Visit (INDEPENDENT_AMBULATORY_CARE_PROVIDER_SITE_OTHER): Payer: Managed Care, Other (non HMO) | Admitting: Obstetrics & Gynecology

## 2015-10-08 ENCOUNTER — Ambulatory Visit (INDEPENDENT_AMBULATORY_CARE_PROVIDER_SITE_OTHER): Payer: Managed Care, Other (non HMO)

## 2015-10-08 ENCOUNTER — Encounter: Payer: Self-pay | Admitting: Obstetrics & Gynecology

## 2015-10-08 VITALS — BP 140/90 | HR 68 | Wt 204.0 lb

## 2015-10-08 DIAGNOSIS — N854 Malposition of uterus: Secondary | ICD-10-CM

## 2015-10-08 DIAGNOSIS — R1031 Right lower quadrant pain: Secondary | ICD-10-CM | POA: Diagnosis not present

## 2015-10-08 DIAGNOSIS — N83202 Unspecified ovarian cyst, left side: Secondary | ICD-10-CM

## 2015-10-08 DIAGNOSIS — R1032 Left lower quadrant pain: Secondary | ICD-10-CM | POA: Diagnosis not present

## 2015-10-08 DIAGNOSIS — N888 Other specified noninflammatory disorders of cervix uteri: Secondary | ICD-10-CM

## 2015-10-08 DIAGNOSIS — R102 Pelvic and perineal pain: Secondary | ICD-10-CM

## 2015-10-08 NOTE — Progress Notes (Signed)
Patient ID: Tina Wall, female   DOB: March 10, 1981, 35 y.o.   MRN: [id number] Follow up appointment for results  Chief Complaint  Patient presents with  . Follow-up    ultrasound    Blood pressure 140/90, pulse 68, weight 204 lb (92.534 kg), last menstrual period 10/04/2015.  US Transvaginal Non-ob  10/08/2015  GYNECOLOGIC SONOGRAM Tina Wall is a 35 y.o. G3P3003 LMP 10/04/2015 for a pelvic sonogram for bilat pelvic pain. Uterus                      11.4 x 5.7 x 3.3 cm, normal homogenous anteverted uterus,mult nabothian cysts Endometrium          5.1 mm, symmetrical, wnl Right ovary             4.2 x 2.7 x 2.1 cm, wnl Left ovary                5.1 x 3.7 x 4.3 cm, simple exophytic cyst 4.5 x 3 x 3.7cm Technician Comments: PELVIC US TA/TV: normal homogenous anteverted uterus,mult nabothian cysts,normal rt ov, lt ov: simple exophytic cyst 4.5 x 3 x 3.7cm (mobile),no free fluid,lt adnexal pain during ultrasound,EEC 5.31mm [employer] 10/08/2015 4:23 PM Clinical Impression and recommendations: I have reviewed the sonogram results above, combined with the patient's current clinical course, below are my impressions and any appropriate recommendations for management based on the sonographic findings. Uterus is normal, upper size limits Normal endometrium Right ovary normal Left ovary with simple cyst, physiologic vs benign neoplasm Tina Wall,Tina Wall 10/08/2015 4:28 PM   US Pelvis Complete  10/08/2015  GYNECOLOGIC SONOGRAM Tina Wall is a 35 y.o. G3P3003 LMP 10/04/2015 for a pelvic sonogram for bilat pelvic pain. Uterus                      11.4 x 5.7 x 3.3 cm, normal homogenous anteverted uterus,mult nabothian cysts Endometrium          5.1 mm, symmetrical, wnl Right ovary             4.2 x 2.7 x 2.1 cm, wnl Left ovary                5.1 x 3.7 x 4.3 cm, simple exophytic cyst 4.5 x 3 x 3.7cm Technician Comments: PELVIC US TA/TV: normal homogenous anteverted uterus,mult nabothian cysts,normal rt ov, lt ov:  simple exophytic cyst 4.5 x 3 x 3.7cm (mobile),no free fluid,lt adnexal pain during ultrasound,EEC 5.61mm [employer] 10/08/2015 4:23 PM Clinical Impression and recommendations: I have reviewed the sonogram results above, combined with the patient's current clinical course, below are my impressions and any appropriate recommendations for management based on the sonographic findings. Uterus is normal, upper size limits Normal endometrium Right ovary normal Left ovary with simple cyst, physiologic vs benign neoplasm Tina Wall,Tina Wall 10/08/2015 4:28 PM      MEDS ordered this encounter: No orders of the defined types were placed in this encounter.    Orders for this encounter: No orders of the defined types were placed in this encounter.    Plan:    Follow Up:prn     Face to face time:  10 minutes  Greater than 50% of the visit time was spent in counseling and coordination of care with the patient.  The summary and outline of the counseling and care coordination is summarized in the note above.   All questions were answered.  Past  Medical History  Diagnosis Date  . GI bleeding   . Asthma   . Bronchitis   . Hx of trichomoniasis   . Hx of chlamydia infection   . HSV-2 (herpes simplex virus 2) infection     Past Surgical History  Procedure Laterality Date  . Tonsillectomy    . Cesarean section    . Foot surgery      OB History    Gravida Para Term Preterm AB TAB SAB Ectopic Multiple Living   3 3 3       3       Allergies  Allergen Reactions  . Ace Inhibitors Shortness Of Breath    Short of breath  . Latex Other (See Comments)    PT IS UNSURE OF WHETHER OR NOT THIS IS AN ALLERGY    Social History   Social History  . Marital Status: Single    Spouse Name: N/A  . Number of Children: N/A  . Years of Education: N/A   Social History Main Topics  . Smoking status: Never Smoker   . Smokeless tobacco: Never Used  . Alcohol Use: 0.0 oz/week    0 Standard drinks or  equivalent per week     Comment: occasionally  . Drug Use: No  . Sexual Activity: Yes   Other Topics Concern  . None   Social History Narrative    Family History  Problem Relation Age of Onset  . Hypertension Mother   . Diabetes Mother   . Kidney disease Mother   . Cancer Father     trachea

## 2015-10-08 NOTE — Progress Notes (Signed)
PELVIC US TA/TV: normal homogenous anteverted uterus,mult nabothian cysts,normal rt ov, lt ov: simple exophytic cyst 4.5 x 3 x 3.7cm (mobile),no free fluid,lt adnexal pain during ultrasound,EEC 5.29mm

## 2015-12-31 ENCOUNTER — Ambulatory Visit: Payer: Managed Care, Other (non HMO) | Admitting: Family Medicine

## 2016-01-22 ENCOUNTER — Ambulatory Visit (INDEPENDENT_AMBULATORY_CARE_PROVIDER_SITE_OTHER): Payer: Managed Care, Other (non HMO) | Admitting: Primary Care

## 2016-01-22 ENCOUNTER — Encounter: Payer: Self-pay | Admitting: Primary Care

## 2016-01-22 VITALS — BP 118/64 | HR 94 | Temp 98.0°F | Ht 61.0 in | Wt 211.0 lb

## 2016-01-22 DIAGNOSIS — I10 Essential (primary) hypertension: Secondary | ICD-10-CM

## 2016-01-22 DIAGNOSIS — Z113 Encounter for screening for infections with a predominantly sexual mode of transmission: Secondary | ICD-10-CM | POA: Diagnosis not present

## 2016-01-22 DIAGNOSIS — R7303 Prediabetes: Secondary | ICD-10-CM

## 2016-01-22 LAB — BASIC METABOLIC PANEL
BUN: 9 mg/dL (ref 7–25)
CO2: 25 mmol/L (ref 20–31)
Calcium: 9.6 mg/dL (ref 8.6–10.2)
Chloride: 101 mmol/L (ref 98–110)
Creat: 0.93 mg/dL (ref 0.50–1.10)
Glucose, Bld: 98 mg/dL (ref 65–99)
Potassium: 4.3 mmol/L (ref 3.5–5.3)
Sodium: 138 mmol/L (ref 135–146)

## 2016-01-22 LAB — HEMOGLOBIN A1C
Hgb A1c MFr Bld: 5.7 % — ABNORMAL HIGH (ref <5.7)
Mean Plasma Glucose: 117 mg/dL

## 2016-01-22 MED ORDER — AMLODIPINE BESYLATE 5 MG PO TABS: 5.0000 mg | ORAL_TABLET | Freq: Every day | ORAL | Status: DC

## 2016-01-22 MED ORDER — METFORMIN HCL ER 500 MG PO TB24
500.0000 mg | ORAL_TABLET | Freq: Every day | ORAL | Status: DC
Start: 2016-01-22 — End: 2016-06-14

## 2016-01-22 NOTE — Patient Instructions (Addendum)
Complete lab work prior to leaving today. I will notify you of your results once received.   I sent refills of your amlodipine to the pharmacy.  Continue to work on improvements in your diet as discussed today.  Start exercising. You should be getting 1 hour of moderate intensity exercise 5 days weekly.  Ensure you are consuming 64 ounces of water daily.  It was a pleasure to see you today!

## 2016-01-22 NOTE — Progress Notes (Signed)
Subjective:    Patient ID: Tina Wall, female    DOB: [birth year], 35 y.o.   MRN: [id number]  HPI  Ms. Tina Wall is a 35 year old female who presents today for follow up.  1) Essential Hypertension: Currently managed on Amlodipine 5 mg. BP in the clinic today stable at 118/64. Denies chest pain, dizziness, shortness of breath.  2) Obesity: Weight loss down to 186 in the Fall of 2016. She's recently gained weight and is worried about her increased weight. She lost control of her diet during Christmas and hasn't done much to improve her diet since. She has recently started exercising but injured her right lower extremity during a boot camp session.   Her diet consists of: Breakfast: Smoothie recently. Oatmeal Lunch: Fast food Dinner: Skips, fried chicken, pasta, tacos Snacks: Vegetables, boiled eggs, tuna Desserts: Occasionally Beverages: Water, sweet tea  Wt Readings from Last 3 Encounters:  01/22/16 211 lb (95.709 kg)  10/08/15 204 lb (92.534 kg)  09/21/15 202 lb 12.8 oz (91.989 kg)     2) STD Testing: She would like STD testing as she had unprotected intercourse 2 weeks ago with a new partner. Denies vaginal discharge, vaginal itching, urinary frequency, fevers, genital ulcers.  Review of Systems  Respiratory: Negative for shortness of breath.   Cardiovascular: Negative for chest pain.  Gastrointestinal: Negative for abdominal pain.  Genitourinary: Negative for dysuria, frequency and vaginal discharge.  Neurological: Negative for dizziness and headaches.       Past Medical History  Diagnosis Date  . GI bleeding   . Asthma   . Bronchitis   . Hx of trichomoniasis   . Hx of chlamydia infection   . HSV-2 (herpes simplex virus 2) infection      Social History   Social History  . Marital Status: Single    Spouse Name: N/A  . Number of Children: N/A  . Years of Education: N/A   Occupational History  . Not on file.   Social History Main Topics  . Smoking status:  Never Smoker   . Smokeless tobacco: Never Used  . Alcohol Use: 0.0 oz/week    0 Standard drinks or equivalent per week     Comment: occasionally  . Drug Use: No  . Sexual Activity: Yes   Other Topics Concern  . Not on file   Social History Narrative    Past Surgical History  Procedure Laterality Date  . Tonsillectomy    . Cesarean section    . Foot surgery      Family History  Problem Relation Age of Onset  . Hypertension Mother   . Diabetes Mother   . Kidney disease Mother   . Cancer Father     trachea     Allergies  Allergen Reactions  . Ace Inhibitors Shortness Of Breath    Short of breath  . Latex Other (See Comments)    PT IS UNSURE OF WHETHER OR NOT THIS IS AN ALLERGY    Current Outpatient Prescriptions on File Prior to Visit  Medication Sig Dispense Refill  . albuterol (PROAIR HFA) 108 (90 BASE) MCG/ACT inhaler Inhale into the lungs. Reported on 10/08/2015    . piroxicam (FELDENE) 20 MG capsule Take 1 capsule (20 mg total) by mouth daily. 30 capsule 1   No current facility-administered medications on file prior to visit.    BP 118/64 mmHg  Pulse 94  Temp(Src) 98 F (36.7 C) (Oral)  Ht 5\' 1"  (1.549 m)  Wt 211 lb (95.709 kg)  BMI 39.89 kg/m2  SpO2 98%  LMP 12/30/2015    Objective:   Physical Exam  Constitutional: She appears well-nourished.  Cardiovascular: Normal rate and regular rhythm.   Pulmonary/Chest: Effort normal and breath sounds normal.  Skin: Skin is warm and dry.          Assessment & Plan:  STD Testing:  Unprotected intercourse 2 weeks ago, new partner. No symptoms. Would like resting for STD's. Labs pending. Patient declines pelvic exam.

## 2016-01-22 NOTE — Assessment & Plan Note (Signed)
Stable on Amlodipine. Will consider removing if she can lose weight again through diet and exercise. Recheck in 6 months.

## 2016-01-22 NOTE — Assessment & Plan Note (Signed)
Continue Metformin for now. Will consider discontinuing if she can lose weight and if A1C continues to be WNL. A1C pending today.

## 2016-01-22 NOTE — Progress Notes (Signed)
Pre visit review using our clinic review tool, if applicable. No additional management support is needed unless otherwise documented below in the visit note. 

## 2016-01-23 LAB — HIV ANTIBODY (ROUTINE TESTING W REFLEX): HIV 1&2 Ab, 4th Generation: NONREACTIVE

## 2016-01-23 LAB — RPR: RPR Ser Ql: REACTIVE — AB

## 2016-01-23 LAB — RPR TITER: RPR Titer: 1:2 {titer}

## 2016-01-23 LAB — HEPATITIS C ANTIBODY: HCV Ab: NEGATIVE

## 2016-01-24 LAB — GC/CHLAMYDIA PROBE AMP
CT Probe RNA: NOT DETECTED
GC Probe RNA: NOT DETECTED

## 2016-01-25 LAB — FLUORESCENT TREPONEMAL AB(FTA)-IGG-BLD: Fluorescent Treponemal ABS: REACTIVE — AB

## 2016-01-25 LAB — HSV(HERPES SMPLX)ABS-I+II(IGG+IGM)-BLD
HSV 1 Glycoprotein G Ab, IgG: <0.9 Index (ref <0.90)
HSV 2 Glycoprotein G Ab, IgG: 21.5 Index — ABNORMAL HIGH (ref <0.90)
Herpes Simplex Vrs I&II-IgM Ab (EIA): 1.43 INDEX — ABNORMAL HIGH

## 2016-02-03 ENCOUNTER — Ambulatory Visit (INDEPENDENT_AMBULATORY_CARE_PROVIDER_SITE_OTHER): Payer: Managed Care, Other (non HMO) | Admitting: *Deleted

## 2016-02-03 ENCOUNTER — Telehealth: Payer: Self-pay | Admitting: Primary Care

## 2016-02-03 DIAGNOSIS — A539 Syphilis, unspecified: Secondary | ICD-10-CM

## 2016-02-03 MED ORDER — PENICILLIN G BENZATHINE 1200000 UNIT/2ML IM SUSP
2.4000 10*6.[IU] | Freq: Once | INTRAMUSCULAR | Status: AC
  Administered 2016-02-03: 2.4 10*6.[IU] via INTRAMUSCULAR

## 2016-02-03 NOTE — Telephone Encounter (Signed)
Patient is positive for syphilis. Please administer 2.4 million units of Bicilin LA intramuscularly once.

## 2016-03-07 ENCOUNTER — Encounter: Payer: Self-pay | Admitting: Obstetrics & Gynecology

## 2016-03-07 ENCOUNTER — Ambulatory Visit: Payer: Managed Care, Other (non HMO) | Admitting: Obstetrics & Gynecology

## 2016-03-07 ENCOUNTER — Telehealth: Payer: Self-pay | Admitting: Obstetrics & Gynecology

## 2016-03-07 ENCOUNTER — Encounter: Payer: Self-pay | Admitting: Primary Care

## 2016-03-07 MED ORDER — HYDROCODONE-ACETAMINOPHEN 5-325 MG PO TABS: 1.0000 | ORAL_TABLET | Freq: Four times a day (QID) | ORAL | Status: DC | PRN

## 2016-03-15 ENCOUNTER — Ambulatory Visit: Payer: Managed Care, Other (non HMO) | Admitting: Obstetrics & Gynecology

## 2016-04-12 ENCOUNTER — Ambulatory Visit (INDEPENDENT_AMBULATORY_CARE_PROVIDER_SITE_OTHER): Payer: Managed Care, Other (non HMO) | Admitting: Primary Care

## 2016-04-12 ENCOUNTER — Encounter: Payer: Self-pay | Admitting: Primary Care

## 2016-04-12 VITALS — BP 124/84 | HR 90 | Temp 98.3°F | Ht 61.0 in | Wt 222.8 lb

## 2016-04-12 DIAGNOSIS — N644 Mastodynia: Secondary | ICD-10-CM

## 2016-04-12 DIAGNOSIS — L03113 Cellulitis of right upper limb: Secondary | ICD-10-CM | POA: Diagnosis not present

## 2016-04-12 DIAGNOSIS — J45909 Unspecified asthma, uncomplicated: Secondary | ICD-10-CM | POA: Insufficient documentation

## 2016-04-12 DIAGNOSIS — J4520 Mild intermittent asthma, uncomplicated: Secondary | ICD-10-CM

## 2016-04-12 MED ORDER — CEPHALEXIN 500 MG PO CAPS: 500.0000 mg | ORAL_CAPSULE | Freq: Three times a day (TID) | ORAL | 0 refills | Status: DC

## 2016-04-12 NOTE — Progress Notes (Signed)
Subjective:    Patient ID: Tina Wall, female    DOB: 1980/10/23, 35 y.o.   MRN: [id number]  HPI  Ms. Tina Wall is a 35 year old female who presents today with multiple complaints.  1) Insect bite. The insect bite is located to the right upper anterior forearm which she first noticed Sunday this week. The bite started out as 2 spots and has since grown significantly larger. She reports itching, erythema, tenderness. She's not applied or taken anything OTC for her symptoms.   2) Breast Tenderness: Located to 4 and 7 o'clock position of right breast that has been consistent for the past 3 months. Denies fevers, erythema, swelling, lumps, changes in skin texture, trauma/injury. No family history of breast cancer. Overall her symptoms have not improved or progress.  3) Chest Tightness: Located to anterior bilateral chest during the past 2 times she has tried exercise. She is working to lose weight and has started walking the track briskly but will develop chest tightness with shortness of breath about 10-15 minutes into her walk. This has occurred twice, both times during exercise. History of asthma. Denies nausea, diaphoresis, chest pain. The chest tightness resolves after several minutes of rest.  Review of Systems  Constitutional: Negative for fever.  Respiratory: Positive for chest tightness, shortness of breath and wheezing. Negative for cough.   Cardiovascular: Negative for palpitations.       Right breast tenderness.  Gastrointestinal: Negative for nausea.  Skin: Positive for color change and wound.  Neurological: Negative for dizziness.       Past Medical History:  Diagnosis Date  . Asthma   . Bronchitis   . GI bleeding   . HSV-2 (herpes simplex virus 2) infection   . Hx of chlamydia infection   . Hx of trichomoniasis      Social History   Social History  . Marital status: Single    Spouse name: N/A  . Number of children: N/A  . Years of education: N/A   Occupational  History  . Not on file.   Social History Main Topics  . Smoking status: Never Smoker  . Smokeless tobacco: Never Used  . Alcohol use 0.0 oz/week     Comment: occasionally  . Drug use: No  . Sexual activity: Yes   Other Topics Concern  . Not on file   Social History Narrative  . No narrative on file    Past Surgical History:  Procedure Laterality Date  . CESAREAN SECTION    . FOOT SURGERY    . TONSILLECTOMY      Family History  Problem Relation Age of Onset  . Hypertension Mother   . Diabetes Mother   . Kidney disease Mother   . Cancer Father     trachea     Allergies  Allergen Reactions  . Ace Inhibitors Shortness Of Breath    Short of breath  . Latex Other (See Comments)    PT IS UNSURE OF WHETHER OR NOT THIS IS AN ALLERGY    Current Outpatient Prescriptions on File Prior to Visit  Medication Sig Dispense Refill  . albuterol (PROAIR HFA) 108 (90 BASE) MCG/ACT inhaler Inhale into the lungs. Reported on 10/08/2015    . amLODipine (NORVASC) 5 MG tablet Take 1 tablet (5 mg total) by mouth daily. 90 tablet 1  . metFORMIN (GLUCOPHAGE-XR) 500 MG 24 hr tablet Take 1 tablet (500 mg total) by mouth daily with breakfast. 30 tablet 5   No current  facility-administered medications on file prior to visit.     BP 124/84   Pulse 90   Temp 98.3 F (36.8 C) (Oral)   Ht 5\' 1"  (1.549 m)   Wt 222 lb 12.8 oz (101.1 kg)   LMP 03/21/2016   SpO2 98%   BMI 42.10 kg/m    Objective:   Physical Exam  Constitutional: She appears well-nourished.  Neck: Neck supple.  Cardiovascular: Normal rate and regular rhythm.   Pulmonary/Chest: Effort normal and breath sounds normal. She has no wheezes. Right breast exhibits tenderness. Right breast exhibits no skin change. Left breast exhibits no skin change and no tenderness.    Dense breast tissue noticed bilaterally.  Skin: Skin is warm and dry. There is erythema.  Mild to moderate erythema to upper part of right upper extremity 3-4  cm circumference. Warm and tender. Significant difference when compared to left upper extremity.          Assessment & Plan:  Cellulitis:  Insect bite on Sunday this week. Tenderness, itching home erythema since Sunday. Mild to moderate erythema located to upper right extremity representative of acute cellulitis. Prescription for Keflex 3 times a day 7 days sent to pharmacy. Discussed application of hydrocortisone cream as needed for itching. Ibuprofen as needed for pain and inflammation. Follow-up as needed.  Breast tenderness:  Located to 4 and 7 o'clock positions of right breast. Dense tissue noticed bilaterally. No changes in skin texture, erythema noted. Given duration without improvement will obtain diagnostic mammogram and ultrasound for further evaluation. No family history of breast cancer.  Sheral Flow, NP

## 2016-04-12 NOTE — Progress Notes (Signed)
Pre visit review using our clinic review tool, if applicable. No additional management support is needed unless otherwise documented below in the visit note. 

## 2016-04-12 NOTE — Patient Instructions (Signed)
You will be contacted regarding your Ultrasound and Mammogram.  Please let us know if you have not heard back within one week.   Start Cephalexin antibiotics. Take 1 capsule by mouth three daily for 7 days for skin infection.  You may apply cortisone cream twice daily to the insect bite to help relieve itching.  Inhale 1-2 puffs of  your albuterol inhaler 5 minutes prior to exercise. Please notify me if you continue to experience chest tightness during exercise.  It was a pleasure to see you today!

## 2016-04-12 NOTE — Assessment & Plan Note (Signed)
Diagnosis a child. Uses albuterol inhaler infrequently. Has recently started exercising and has noticed chest tightness and wheezing after exercise. We'll have her use albuterol inhaler 5 minutes prior to exercise to prevent tightness and wheezing. She will update if no improvement, will do spirometry at that point.

## 2016-04-18 ENCOUNTER — Other Ambulatory Visit: Payer: Self-pay | Admitting: Primary Care

## 2016-04-18 ENCOUNTER — Encounter: Payer: Self-pay | Admitting: Primary Care

## 2016-04-18 DIAGNOSIS — Z3201 Encounter for pregnancy test, result positive: Secondary | ICD-10-CM

## 2016-04-20 ENCOUNTER — Ambulatory Visit: Payer: Managed Care, Other (non HMO) | Admitting: Primary Care

## 2016-04-20 ENCOUNTER — Ambulatory Visit (INDEPENDENT_AMBULATORY_CARE_PROVIDER_SITE_OTHER): Payer: Managed Care, Other (non HMO) | Admitting: Obstetrics and Gynecology

## 2016-04-20 ENCOUNTER — Encounter: Payer: Self-pay | Admitting: Obstetrics and Gynecology

## 2016-04-20 ENCOUNTER — Other Ambulatory Visit: Payer: Managed Care, Other (non HMO)

## 2016-04-20 VITALS — BP 150/82 | HR 99 | Ht 60.5 in | Wt 224.6 lb

## 2016-04-20 DIAGNOSIS — I10 Essential (primary) hypertension: Secondary | ICD-10-CM

## 2016-04-20 DIAGNOSIS — Z369 Encounter for antenatal screening, unspecified: Secondary | ICD-10-CM

## 2016-04-20 DIAGNOSIS — O09891 Supervision of other high risk pregnancies, first trimester: Secondary | ICD-10-CM

## 2016-04-20 DIAGNOSIS — R5383 Other fatigue: Secondary | ICD-10-CM

## 2016-04-20 DIAGNOSIS — Z3201 Encounter for pregnancy test, result positive: Secondary | ICD-10-CM

## 2016-04-20 LAB — POCT URINE PREGNANCY: Preg Test, Ur: POSITIVE — AB

## 2016-04-20 MED ORDER — LABETALOL HCL 200 MG PO TABS: 200.0000 mg | ORAL_TABLET | Freq: Two times a day (BID) | ORAL | 3 refills | Status: DC

## 2016-04-20 NOTE — Progress Notes (Signed)
[facility] Visit  04/20/16        Patient name: Tina Wall MRN [id number]  Date of birth: Dec 26, 1980  CC & HPI:  Tina Wall is a 35 y.o. female presenting today for discussion in regards to her elevated blood pressure and medications. Pt notes that she had a positive pregnancy test on 04/17/2016. Patient's last menstrual period was 03/20/2016 and it was normal. Pt has associated symptoms of fatigue. Pt hasn't tried any medications for the relief of her symptoms. Pt denies n/v, abdominal pain, and any other symptoms. Pt has 3 children with 1 vaginal and 2 C-section deliveries. Pt notes that her first pregnancy was vaginal delivery and her second and third pregnancy were C-sections. Pt delivered her first girl vaginally in 1998. Pt second child was delivered via C-section at [facility] in 2003 at 41 weeks, weighing 8 lbs 6 oz. Pt last pregnancy was a girl in 2007 via C-section, weighing 6 lbs 12 oz at 38 weeks.   ROS:  ROS Constitutional: positive for fatigue Gastrointestinal: negative Genitourinary:negative  Pertinent History Reviewed:   Reviewed: Significant for HSV-2 Medical         Past Medical History:  Diagnosis Date  . Asthma   . Bronchitis   . GI bleeding   . HSV-2 (herpes simplex virus 2) infection   . Hx of chlamydia infection   . Hx of trichomoniasis                               Surgical Hx:    Past Surgical History:  Procedure Laterality Date  . CESAREAN SECTION    . FOOT SURGERY    . TONSILLECTOMY     Medications: Reviewed & Updated - see associated section                       Current Outpatient Prescriptions:  .  albuterol (PROAIR HFA) 108 (90 BASE) MCG/ACT inhaler, Inhale into the lungs. Reported on 10/08/2015, Disp: , Rfl:  .  amLODipine (NORVASC) 5 MG tablet, Take 1 tablet (5 mg total) by mouth daily. (Patient not taking: Reported on 04/20/2016), Disp: 90 tablet, Rfl: 1 .  metFORMIN (GLUCOPHAGE-XR) 500 MG 24 hr tablet, Take 1 tablet (500 mg  total) by mouth daily with breakfast. (Patient not taking: Reported on 04/20/2016), Disp: 30 tablet, Rfl: 5   Social History: Reviewed -  reports that she has never smoked. She has never used smokeless tobacco.  Objective Findings:  Vitals: Blood pressure (!) 150/82, pulse 99, height 5' 0.5" (1.537 m), weight 224 lb 9.6 oz (101.9 kg), last menstrual period 03/20/2016.  Physical Examination: General appearance - alert, well appearing, and in no distress, oriented to person, place, and time and overweight Mental status - alert, oriented to person, place, and time, normal mood, behavior, speech, dress, motor activity, and thought processes Eyes - pupils equal and reactive, extraocular eye movements intact  Discussed with pt risks and benefits of blood pressure medications in pregnancy. At end of discussion, pt had opportunity to ask questions and has no further questions at this time.   Greater than 50% was spent in counseling and coordination of care with the patient. Total time greater than: 15 minutes    Assessment & Plan:   A:  1. Early pregnancy  2. Chronic HTN  P:  1. Rx labetalol . 200 bid  2. Prenatal lab  work and quantitative HCG 3. Follow up in 2 weeks for [place] to determine EDD   By signing my name below, I, Soijett Blue, attest that this documentation has been prepared under the direction and in the presence of Jonnie Kind, MD. Electronically Signed: Soijett Blue, ED Scribe. 04/20/16. 4:14 PM.  I personally performed the services described in this documentation, which was SCRIBED in my presence. The recorded information has been reviewed and considered accurate. It has been edited as necessary during review. Jonnie Kind, MD

## 2016-04-22 ENCOUNTER — Encounter: Payer: Self-pay | Admitting: Obstetrics and Gynecology

## 2016-04-22 LAB — CBC
Hematocrit: 35.1 % (ref 34.0–46.6)
Hemoglobin: 11.5 g/dL (ref 11.1–15.9)
MCH: 29.3 pg (ref 26.6–33.0)
MCHC: 32.8 g/dL (ref 31.5–35.7)
MCV: 90 fL (ref 79–97)
Platelets: 252 10*3/uL (ref 150–379)
RBC: 3.92 x10E6/uL (ref 3.77–5.28)
RDW: 15.3 % (ref 12.3–15.4)
WBC: 5 10*3/uL (ref 3.4–10.8)

## 2016-04-22 LAB — ANTIBODY SCREEN: Antibody Screen: NEGATIVE

## 2016-04-22 LAB — SICKLE CELL SCREEN: Sickle Cell Screen: NEGATIVE

## 2016-04-22 LAB — HEPATITIS B SURFACE ANTIGEN: Hepatitis B Surface Ag: NEGATIVE

## 2016-04-22 LAB — RPR, QUANT+TP ABS (REFLEX)
Rapid Plasma Reagin, Quant: 1:2 {titer} — ABNORMAL HIGH
T Pallidum Abs: POSITIVE — AB

## 2016-04-22 LAB — RPR: RPR Ser Ql: REACTIVE — AB

## 2016-04-22 LAB — RUBELLA SCREEN: Rubella Antibodies, IGG: 2.43 index (ref >0.99)

## 2016-04-22 LAB — HIV ANTIBODY (ROUTINE TESTING W REFLEX): HIV Screen 4th Generation wRfx: NONREACTIVE

## 2016-04-22 LAB — ABO/RH: Rh Factor: POSITIVE

## 2016-04-22 LAB — BETA HCG QUANT (REF LAB): hCG Quant: 876 m[IU]/mL

## 2016-04-22 LAB — VARICELLA ZOSTER ANTIBODY, IGG: Varicella zoster IgG: 2500 index (ref >165)

## 2016-04-27 ENCOUNTER — Other Ambulatory Visit: Payer: Managed Care, Other (non HMO)

## 2016-04-27 ENCOUNTER — Ambulatory Visit: Admission: RE | Admit: 2016-04-27 | Payer: Managed Care, Other (non HMO) | Source: Ambulatory Visit

## 2016-04-29 ENCOUNTER — Telehealth: Payer: Self-pay | Admitting: *Deleted

## 2016-04-29 NOTE — Telephone Encounter (Signed)
Pt states will email the remainder of her FMLA forms to be completed.

## 2016-05-02 ENCOUNTER — Other Ambulatory Visit: Payer: Self-pay | Admitting: Obstetrics & Gynecology

## 2016-05-02 DIAGNOSIS — O3680X0 Pregnancy with inconclusive fetal viability, not applicable or unspecified: Secondary | ICD-10-CM

## 2016-05-04 ENCOUNTER — Ambulatory Visit (INDEPENDENT_AMBULATORY_CARE_PROVIDER_SITE_OTHER): Payer: Managed Care, Other (non HMO)

## 2016-05-04 ENCOUNTER — Other Ambulatory Visit: Payer: Self-pay | Admitting: Obstetrics & Gynecology

## 2016-05-04 DIAGNOSIS — O3481 Maternal care for other abnormalities of pelvic organs, first trimester: Secondary | ICD-10-CM | POA: Diagnosis not present

## 2016-05-04 DIAGNOSIS — Z3A01 Less than 8 weeks gestation of pregnancy: Secondary | ICD-10-CM

## 2016-05-04 DIAGNOSIS — O3680X0 Pregnancy with inconclusive fetal viability, not applicable or unspecified: Secondary | ICD-10-CM

## 2016-05-04 DIAGNOSIS — O3411 Maternal care for benign tumor of corpus uteri, first trimester: Secondary | ICD-10-CM

## 2016-05-04 NOTE — Progress Notes (Signed)
[place] 6+3 wks,single IUP w/ys,pos fht 124 bpm,crl 5.3 mm,ant lt subserosal fibroid 2.4 x 2.3 x 2.5 cm,subchorionic hemorrhage 2.8 x 2.4 x .7 cm,normal right ov,simple lt corpus luteal cyst 2.8 x 2.4 x 2.7 cm

## 2016-05-05 DIAGNOSIS — Z029 Encounter for administrative examinations, unspecified: Secondary | ICD-10-CM

## 2016-05-23 ENCOUNTER — Ambulatory Visit (INDEPENDENT_AMBULATORY_CARE_PROVIDER_SITE_OTHER): Payer: Managed Care, Other (non HMO) | Admitting: Women's Health

## 2016-05-23 ENCOUNTER — Encounter: Payer: Self-pay | Admitting: Women's Health

## 2016-05-23 VITALS — BP 132/68 | HR 72 | Wt 225.0 lb

## 2016-05-23 DIAGNOSIS — Z0283 Encounter for blood-alcohol and blood-drug test: Secondary | ICD-10-CM

## 2016-05-23 DIAGNOSIS — Z98891 History of uterine scar from previous surgery: Secondary | ICD-10-CM | POA: Insufficient documentation

## 2016-05-23 DIAGNOSIS — O09891 Supervision of other high risk pregnancies, first trimester: Secondary | ICD-10-CM | POA: Diagnosis not present

## 2016-05-23 DIAGNOSIS — Z331 Pregnant state, incidental: Secondary | ICD-10-CM

## 2016-05-23 DIAGNOSIS — N898 Other specified noninflammatory disorders of vagina: Secondary | ICD-10-CM

## 2016-05-23 DIAGNOSIS — B009 Herpesviral infection, unspecified: Secondary | ICD-10-CM

## 2016-05-23 DIAGNOSIS — O26891 Other specified pregnancy related conditions, first trimester: Secondary | ICD-10-CM

## 2016-05-23 DIAGNOSIS — Z3A10 10 weeks gestation of pregnancy: Secondary | ICD-10-CM

## 2016-05-23 DIAGNOSIS — Z1389 Encounter for screening for other disorder: Secondary | ICD-10-CM

## 2016-05-23 DIAGNOSIS — O23591 Infection of other part of genital tract in pregnancy, first trimester: Secondary | ICD-10-CM

## 2016-05-23 DIAGNOSIS — O34219 Maternal care for unspecified type scar from previous cesarean delivery: Secondary | ICD-10-CM

## 2016-05-23 DIAGNOSIS — O10919 Unspecified pre-existing hypertension complicating pregnancy, unspecified trimester: Secondary | ICD-10-CM

## 2016-05-23 DIAGNOSIS — O0990 Supervision of high risk pregnancy, unspecified, unspecified trimester: Secondary | ICD-10-CM

## 2016-05-23 DIAGNOSIS — D219 Benign neoplasm of connective and other soft tissue, unspecified: Secondary | ICD-10-CM | POA: Insufficient documentation

## 2016-05-23 DIAGNOSIS — D252 Subserosal leiomyoma of uterus: Secondary | ICD-10-CM

## 2016-05-23 DIAGNOSIS — Z369 Encounter for antenatal screening, unspecified: Secondary | ICD-10-CM

## 2016-05-23 DIAGNOSIS — Z3682 Encounter for antenatal screening for nuchal translucency: Secondary | ICD-10-CM

## 2016-05-23 HISTORY — DX: Benign neoplasm of connective and other soft tissue, unspecified: D21.9

## 2016-05-23 HISTORY — DX: History of uterine scar from previous surgery: Z98.891

## 2016-05-23 LAB — POCT URINALYSIS DIPSTICK
Blood, UA: NEGATIVE
Glucose, UA: NEGATIVE
Ketones, UA: NEGATIVE
Leukocytes, UA: NEGATIVE
Nitrite, UA: NEGATIVE

## 2016-05-23 LAB — POCT WET PREP (WET MOUNT)
Clue Cells Wet Prep Whiff POC: POSITIVE
Trichomonas Wet Prep HPF POC: ABSENT

## 2016-05-23 MED ORDER — METRONIDAZOLE 500 MG PO TABS: 500.0000 mg | ORAL_TABLET | Freq: Two times a day (BID) | ORAL | 0 refills | Status: DC

## 2016-05-23 NOTE — Patient Instructions (Addendum)
You will have your sugar test next visit.  Please do not eat or drink anything after midnight the night before you come, not even water.  You will be here for at least two hours.     Begin taking a 81mg  baby aspirin daily at 12 weeks of pregnancy (10/22) to decrease risk of preeclampsia during pregnancy   Nausea & Vomiting  Have saltine crackers or pretzels by your bed and eat a few bites before you raise your head out of bed in the morning  Eat small frequent meals throughout the day instead of large meals  Drink plenty of fluids throughout the day to stay hydrated, just don't drink a lot of fluids with your meals.  This can make your stomach fill up faster making you feel sick  Do not brush your teeth right after you eat  Products with real ginger are good for nausea, like ginger ale and ginger hard candy Make sure it says made with real ginger!  Sucking on sour candy like lemon heads is also good for nausea  If your prenatal vitamins make you nauseated, take them at night so you will sleep through the nausea  Sea Bands  If you feel like you need medicine for the nausea & vomiting please let us know  If you are unable to keep any fluids or food down please let us know   Constipation  Drink plenty of fluid, preferably water, throughout the day  Eat foods high in fiber such as fruits, vegetables, and grains  Exercise, such as walking, is a good way to keep your bowels regular  Drink warm fluids, especially warm prune juice, or decaf coffee  Eat a 1/2 cup of real oatmeal (not instant), 1/2 cup applesauce, and 1/2-1 cup warm prune juice every day  If needed, you may take Colace (docusate sodium) stool softener once or twice a day to help keep the stool soft. If you are pregnant, wait until you are out of your first trimester (12-14 weeks of pregnancy)  If you still are having problems with constipation, you may take Miralax once daily as needed to help keep your bowels regular.   If you are pregnant, wait until you are out of your first trimester (12-14 weeks of pregnancy)   First Trimester of Pregnancy The first trimester of pregnancy is from week 1 until the end of week 12 (months 1 through 3). A week after a sperm fertilizes an egg, the egg will implant on the wall of the uterus. This embryo will begin to develop into a baby. Genes from you and your partner are forming the baby. The female genes determine whether the baby is a boy or a girl. At 6-8 weeks, the eyes and face are formed, and the heartbeat can be seen on ultrasound. At the end of 12 weeks, all the baby's organs are formed.  Now that you are pregnant, you will want to do everything you can to have a healthy baby. Two of the most important things are to get good prenatal care and to follow your health care provider's instructions. Prenatal care is all the medical care you receive before the baby's birth. This care will help prevent, find, and treat any problems during the pregnancy and childbirth. BODY CHANGES Your body goes through many changes during pregnancy. The changes vary from woman to woman.   You may gain or lose a couple of pounds at first.  You may feel sick to your stomach (nauseous)  and throw up (vomit). If the vomiting is uncontrollable, call your health care provider.  You may tire easily.  You may develop headaches that can be relieved by medicines approved by your health care provider.  You may urinate more often. Painful urination may mean you have a bladder infection.  You may develop heartburn as a result of your pregnancy.  You may develop constipation because certain hormones are causing the muscles that push waste through your intestines to slow down.  You may develop hemorrhoids or swollen, bulging veins (varicose veins).  Your breasts may begin to grow larger and become tender. Your nipples may stick out more, and the tissue that surrounds them (areola) may become darker.  Your  gums may bleed and may be sensitive to brushing and flossing.  Dark spots or blotches (chloasma, mask of pregnancy) may develop on your face. This will likely fade after the baby is born.  Your menstrual periods will stop.  You may have a loss of appetite.  You may develop cravings for certain kinds of food.  You may have changes in your emotions from day to day, such as being excited to be pregnant or being concerned that something may go wrong with the pregnancy and baby.  You may have more vivid and strange dreams.  You may have changes in your hair. These can include thickening of your hair, rapid growth, and changes in texture. Some women also have hair loss during or after pregnancy, or hair that feels dry or thin. Your hair will most likely return to normal after your baby is born. WHAT TO EXPECT AT YOUR PRENATAL VISITS During a routine prenatal visit:  You will be weighed to make sure you and the baby are growing normally.  Your blood pressure will be taken.  Your abdomen will be measured to track your baby's growth.  The fetal heartbeat will be listened to starting around week 10 or 12 of your pregnancy.  Test results from any previous visits will be discussed. Your health care provider may ask you:  How you are feeling.  If you are feeling the baby move.  If you have had any abnormal symptoms, such as leaking fluid, bleeding, severe headaches, or abdominal cramping.  If you have any questions. Other tests that may be performed during your first trimester include:  Blood tests to find your blood type and to check for the presence of any previous infections. They will also be used to check for low iron levels (anemia) and Rh antibodies. Later in the pregnancy, blood tests for diabetes will be done along with other tests if problems develop.  Urine tests to check for infections, diabetes, or protein in the urine.  An ultrasound to confirm the proper growth and  development of the baby.  An amniocentesis to check for possible genetic problems.  Fetal screens for spina bifida and Down syndrome.  You may need other tests to make sure you and the baby are doing well. HOME CARE INSTRUCTIONS  Medicines  Follow your health care provider's instructions regarding medicine use. Specific medicines may be either safe or unsafe to take during pregnancy.  Take your prenatal vitamins as directed.  If you develop constipation, try taking a stool softener if your health care provider approves. Diet  Eat regular, well-balanced meals. Choose a variety of foods, such as meat or vegetable-based protein, fish, milk and low-fat dairy products, vegetables, fruits, and whole grain breads and cereals. Your health care provider will help  you determine the amount of weight gain that is right for you.  Avoid raw meat and uncooked cheese. These carry germs that can cause birth defects in the baby.  Eating four or five small meals rather than three large meals a day may help relieve nausea and vomiting. If you start to feel nauseous, eating a few soda crackers can be helpful. Drinking liquids between meals instead of during meals also seems to help nausea and vomiting.  If you develop constipation, eat more high-fiber foods, such as fresh vegetables or fruit and whole grains. Drink enough fluids to keep your urine clear or pale yellow. Activity and Exercise  Exercise only as directed by your health care provider. Exercising will help you:  Control your weight.  Stay in shape.  Be prepared for labor and delivery.  Experiencing pain or cramping in the lower abdomen or low back is a good sign that you should stop exercising. Check with your health care provider before continuing normal exercises.  Try to avoid standing for long periods of time. Move your legs often if you must stand in one place for a long time.  Avoid heavy lifting.  Wear low-heeled shoes, and  practice good posture.  You may continue to have sex unless your health care provider directs you otherwise. Relief of Pain or Discomfort  Wear a good support bra for breast tenderness.   Take warm sitz baths to soothe any pain or discomfort caused by hemorrhoids. Use hemorrhoid cream if your health care provider approves.   Rest with your legs elevated if you have leg cramps or low back pain.  If you develop varicose veins in your legs, wear support hose. Elevate your feet for 15 minutes, 3-4 times a day. Limit salt in your diet. Prenatal Care  Schedule your prenatal visits by the twelfth week of pregnancy. They are usually scheduled monthly at first, then more often in the last 2 months before delivery.  Write down your questions. Take them to your prenatal visits.  Keep all your prenatal visits as directed by your health care provider. Safety  Wear your seat belt at all times when driving.  Make a list of emergency phone numbers, including numbers for family, friends, the hospital, and police and fire departments. General Tips  Ask your health care provider for a referral to a local prenatal education class. Begin classes no later than at the beginning of month 6 of your pregnancy.  Ask for help if you have counseling or nutritional needs during pregnancy. Your health care provider can offer advice or refer you to specialists for help with various needs.  Do not use hot tubs, steam rooms, or saunas.  Do not douche or use tampons or scented sanitary pads.  Do not cross your legs for long periods of time.  Avoid cat litter boxes and soil used by cats. These carry germs that can cause birth defects in the baby and possibly loss of the fetus by miscarriage or stillbirth.  Avoid all smoking, herbs, alcohol, and medicines not prescribed by your health care provider. Chemicals in these affect the formation and growth of the baby.  Schedule a dentist appointment. At home, brush  your teeth with a soft toothbrush and be gentle when you floss. SEEK MEDICAL CARE IF:   You have dizziness.  You have mild pelvic cramps, pelvic pressure, or nagging pain in the abdominal area.  You have persistent nausea, vomiting, or diarrhea.  You have a bad smelling vaginal  discharge.  You have pain with urination.  You notice increased swelling in your face, hands, legs, or ankles. SEEK IMMEDIATE MEDICAL CARE IF:   You have a fever.  You are leaking fluid from your vagina.  You have spotting or bleeding from your vagina.  You have severe abdominal cramping or pain.  You have rapid weight gain or loss.  You vomit blood or material that looks like coffee grounds.  You are exposed to [place] measles and have never had them.  You are exposed to fifth disease or chickenpox.  You develop a severe headache.  You have shortness of breath.  You have any kind of trauma, such as from a fall or a car accident. Document Released: 08/02/2001 Document Revised: 12/23/2013 Document Reviewed: 06/18/2013 [facility] Patient Information 2015 [place], [place]. This information is not intended to replace advice given to you by your health care provider. Make sure you discuss any questions you have with your health care provider.

## 2016-05-23 NOTE — Progress Notes (Signed)
Subjective:  Tina Wall is a 35 y.o. [id number] African American female at [redacted]w[redacted]d by LMP c/w 6wk u/s, being seen today for her first obstetrical visit.  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  H/O borderline diabetes, last A1C 01/22/16 5.7. Small uterine fibroid noted on dating u/s. Pregnancy history fully reviewed.  Patient reports thinks she has yeast infection, was on antibiotics few weeks ago and always gets yeast infection. Denies itching/irritation, but + d/c, no odor. Denies vb, cramping, uti s/s.  BP 132/68   Pulse 72   Wt 225 lb (102.1 kg)   LMP 03/20/2016 (Exact Date)   BMI 43.22 kg/m   HISTORY: OB History  Gravida Para Term Preterm AB Living  4 3 3     3   SAB TAB Ectopic Multiple Live Births          3    # Outcome Date GA Lbr Len/2nd Weight Sex Delivery Anes PTL Lv  4 Current           3 Term 09/01/05 [redacted]w[redacted]d  6 lb 12 oz (3.062 kg) F CS-LTranv  N LIV  2 Term 10/25/01 [redacted]w[redacted]d  8 lb 6 oz (3.799 kg) F CS-Classical  N      Complications: Fetal Intolerance  1 Term 08/29/96 [redacted]w[redacted]d  6 lb 12 oz (3.062 kg) F Vag-Spont EPI Y LIV     Past Medical History:  Diagnosis Date  . Asthma   . Bronchitis   . GI bleeding   . HSV-2 (herpes simplex virus 2) infection   . Hx of chlamydia infection   . Hx of trichomoniasis    Past Surgical History:  Procedure Laterality Date  . CESAREAN SECTION    . FOOT SURGERY    . TONSILLECTOMY     Family History  Problem Relation Age of Onset  . Hypertension Mother   . Diabetes Mother   . Kidney disease Mother   . Cancer Father     trachea   . Alzheimer's disease Paternal Grandmother     Exam   System:     General: Well developed & nourished, no acute distress   Skin: Warm & dry, normal coloration and turgor, no rashes   Neurologic:  Alert & oriented, normal mood   Cardiovascular: Regular rate & rhythm   Respiratory: Effort & rate normal, LCTAB, acyanotic   Abdomen: Soft, non tender   Extremities: normal strength, tone   Pelvic Exam:    Perineum: Normal perineum   Vulva: Normal, no lesions   Vagina:  Normal mucosa, thin white malodorous d/c   Cervix: Normal, bulbous, appears closed   Uterus: Normal size/shape/contour for GA   Thin prep pap smear neg 07/2015 at [facility] FHR: 170 via informal transabdominal u/s   Assessment:   Pregnancy: [id number] Patient Active Problem List   Diagnosis Date Noted  . Chronic hypertension affecting pregnancy 05/23/2016  . Supervision of high risk pregnancy, antepartum 05/23/2016  . Pregnancy with history of cesarean section, antepartum 05/23/2016  . Fibroid 05/23/2016  . Asthma 04/12/2016  . Borderline diabetes 04/02/2015  . Essential hypertension 04/02/2015  . HSV-2 (herpes simplex virus 2) infection 12/31/2012    [redacted]w[redacted]d [id number] New OB visit BV HSV2+ Prev vag then c/s x 2 CHTN Pre-diabetes Asthma Uterine fibroid  Plan:  Initial labs already drawn- reviewed, will need CMP and 24hr urine when returns for early 2hr gtt Continue prenatal vitamins Problem list reviewed and updated Reviewed n/v relief measures and warning s/s to report Reviewed recommended weight gain based on pre-gravid BMI Encouraged well-balanced diet Genetic Screening discussed Integrated Screen: requested Cystic fibrosis screening discussed declined Ultrasound discussed; fetal survey: requested Follow up in this week for early 2hr gtt, CMP, 24hr urine HSV suppress @ 34wks Rx metronidazole 500mg  BID x 7d for BV, no sex while taking  Baby ASA daily @ 12wks d/t CHTN Discussed TOLAC, wants to schedule repeat c/s @ 39wks, but if labors before would like to try for TOLAC, understands risks are slightly more elevated for uterine rupture d/t c/s x 2 CCNC completed  Tawnya Crook  CNM, [facility] 05/23/2016 5:03 PM

## 2016-05-24 LAB — URINALYSIS, ROUTINE W REFLEX MICROSCOPIC
Bilirubin, UA: NEGATIVE
Glucose, UA: NEGATIVE
Ketones, UA: NEGATIVE
Leukocytes, UA: NEGATIVE
Nitrite, UA: NEGATIVE
Protein, UA: NEGATIVE
RBC, UA: NEGATIVE
Specific Gravity, UA: 1.024 (ref 1.005–1.030)
Urobilinogen, Ur: 0.2 mg/dL (ref 0.2–1.0)
pH, UA: 7.5 (ref 5.0–7.5)

## 2016-05-24 LAB — PMP SCREEN PROFILE (10S), URINE
Amphetamine Screen, Ur: NEGATIVE ng/mL
Barbiturate Screen, Ur: NEGATIVE ng/mL
Benzodiazepine Screen, Urine: NEGATIVE ng/mL
Cannabinoids Ur Ql Scn: NEGATIVE ng/mL
Cocaine(Metab.)Screen, Urine: NEGATIVE ng/mL
Creatinine(Crt), U: 206.8 mg/dL (ref 20.0–300.0)
Methadone Scn, Ur: NEGATIVE ng/mL
Opiate Scrn, Ur: NEGATIVE ng/mL
Oxycodone+Oxymorphone Ur Ql Scn: NEGATIVE ng/mL
PCP Scrn, Ur: NEGATIVE ng/mL
Ph of Urine: 7 (ref 4.5–8.9)
Propoxyphene, Screen: NEGATIVE ng/mL

## 2016-05-25 ENCOUNTER — Other Ambulatory Visit: Payer: Managed Care, Other (non HMO)

## 2016-05-25 DIAGNOSIS — Z131 Encounter for screening for diabetes mellitus: Secondary | ICD-10-CM

## 2016-05-25 LAB — URINE CULTURE

## 2016-05-25 LAB — GC/CHLAMYDIA PROBE AMP
Chlamydia trachomatis, NAA: NEGATIVE
Neisseria gonorrhoeae by PCR: NEGATIVE

## 2016-05-26 LAB — COMPREHENSIVE METABOLIC PANEL
ALT: 17 IU/L (ref 0–32)
AST: 19 IU/L (ref 0–40)
Albumin/Globulin Ratio: 1.1 — ABNORMAL LOW (ref 1.2–2.2)
Albumin: 3.9 g/dL (ref 3.5–5.5)
Alkaline Phosphatase: 34 IU/L — ABNORMAL LOW (ref 39–117)
BUN/Creatinine Ratio: 8 — ABNORMAL LOW (ref 9–23)
BUN: 6 mg/dL (ref 6–20)
Bilirubin Total: 0.3 mg/dL (ref 0.0–1.2)
CO2: 21 mmol/L (ref 18–29)
Calcium: 9 mg/dL (ref 8.7–10.2)
Chloride: 102 mmol/L (ref 96–106)
Creatinine, Ser: 0.77 mg/dL (ref 0.57–1.00)
GFR calc Af Amer: 117 mL/min/{1.73_m2} (ref >59)
GFR calc non Af Amer: 101 mL/min/{1.73_m2} (ref >59)
Globulin, Total: 3.4 g/dL (ref 1.5–4.5)
Glucose: 90 mg/dL (ref 65–99)
Potassium: 4 mmol/L (ref 3.5–5.2)
Sodium: 137 mmol/L (ref 134–144)
Total Protein: 7.3 g/dL (ref 6.0–8.5)

## 2016-05-26 LAB — GLUCOSE TOLERANCE, 2 HOURS W/ 1HR
Glucose, 1 hour: 201 mg/dL — ABNORMAL HIGH (ref 65–179)
Glucose, 2 hour: 152 mg/dL (ref 65–152)
Glucose, Fasting: 82 mg/dL (ref 65–91)

## 2016-05-27 ENCOUNTER — Telehealth: Payer: Self-pay | Admitting: Women's Health

## 2016-05-27 ENCOUNTER — Other Ambulatory Visit: Payer: Self-pay | Admitting: Women's Health

## 2016-05-27 DIAGNOSIS — Z8632 Personal history of gestational diabetes: Secondary | ICD-10-CM

## 2016-05-27 DIAGNOSIS — O24410 Gestational diabetes mellitus in pregnancy, diet controlled: Secondary | ICD-10-CM

## 2016-05-27 HISTORY — DX: Personal history of gestational diabetes: Z86.32

## 2016-05-27 NOTE — Telephone Encounter (Signed)
Notified of failed early 2hr gtt=GDM, referral sent for dietician, will be checking sugars qid, bring log to each appt, let us know if doesn't hear from dietician w/in 2wks.  Roma Schanz, CNM, [facility]NP-BC 05/27/2016 1:45 PM

## 2016-05-30 ENCOUNTER — Other Ambulatory Visit: Payer: Self-pay | Admitting: Women's Health

## 2016-05-31 ENCOUNTER — Encounter: Payer: Self-pay | Admitting: Obstetrics and Gynecology

## 2016-05-31 LAB — HEMOGLOBIN A1C
Est. average glucose Bld gHb Est-mCnc: 117 mg/dL
Hgb A1c MFr Bld: 5.7 % — ABNORMAL HIGH (ref 4.8–5.6)

## 2016-05-31 LAB — PROTEIN, URINE, 24 HOUR
Protein, 24H Urine: 98 mg/24 hr (ref 30–150)
Protein, Ur: 10.9 mg/dL

## 2016-06-14 ENCOUNTER — Ambulatory Visit (INDEPENDENT_AMBULATORY_CARE_PROVIDER_SITE_OTHER): Payer: Managed Care, Other (non HMO)

## 2016-06-14 ENCOUNTER — Ambulatory Visit (INDEPENDENT_AMBULATORY_CARE_PROVIDER_SITE_OTHER): Payer: Managed Care, Other (non HMO) | Admitting: Advanced Practice Midwife

## 2016-06-14 ENCOUNTER — Encounter: Payer: Self-pay | Admitting: Advanced Practice Midwife

## 2016-06-14 VITALS — BP 140/84 | HR 70 | Wt 224.0 lb

## 2016-06-14 DIAGNOSIS — O0991 Supervision of high risk pregnancy, unspecified, first trimester: Secondary | ICD-10-CM

## 2016-06-14 DIAGNOSIS — Z3A13 13 weeks gestation of pregnancy: Secondary | ICD-10-CM

## 2016-06-14 DIAGNOSIS — Z0372 Encounter for suspected placental problem ruled out: Secondary | ICD-10-CM

## 2016-06-14 DIAGNOSIS — Z3682 Encounter for antenatal screening for nuchal translucency: Secondary | ICD-10-CM

## 2016-06-14 DIAGNOSIS — O10911 Unspecified pre-existing hypertension complicating pregnancy, first trimester: Secondary | ICD-10-CM

## 2016-06-14 DIAGNOSIS — O24410 Gestational diabetes mellitus in pregnancy, diet controlled: Secondary | ICD-10-CM

## 2016-06-14 DIAGNOSIS — O10919 Unspecified pre-existing hypertension complicating pregnancy, unspecified trimester: Secondary | ICD-10-CM

## 2016-06-14 DIAGNOSIS — I10 Essential (primary) hypertension: Secondary | ICD-10-CM

## 2016-06-14 DIAGNOSIS — Z331 Pregnant state, incidental: Secondary | ICD-10-CM

## 2016-06-14 DIAGNOSIS — O3491 Maternal care for abnormality of pelvic organ, unspecified, first trimester: Secondary | ICD-10-CM | POA: Diagnosis not present

## 2016-06-14 DIAGNOSIS — Z1389 Encounter for screening for other disorder: Secondary | ICD-10-CM

## 2016-06-14 DIAGNOSIS — O0990 Supervision of high risk pregnancy, unspecified, unspecified trimester: Secondary | ICD-10-CM

## 2016-06-14 LAB — POCT URINALYSIS DIPSTICK
Blood, UA: NEGATIVE
Glucose, UA: NEGATIVE
Ketones, UA: NEGATIVE
Leukocytes, UA: NEGATIVE
Nitrite, UA: NEGATIVE
Protein, UA: NEGATIVE

## 2016-06-14 MED ORDER — ASPIRIN 81 MG PO CHEW: 81.0000 mg | CHEWABLE_TABLET | Freq: Every day | ORAL | 6 refills | Status: DC

## 2016-06-14 NOTE — Progress Notes (Signed)
Fetal Surveillance Testing today:  [place]   High Risk Pregnancy Diagnosis(es):   CHTN, A1/B DM  [id number] [redacted]w[redacted]d Estimated Date of Delivery: 12/25/16  Blood pressure 140/84, pulse 70, weight 224 lb (101.6 kg), last menstrual period 03/20/2016.  Urinalysis: Negative   HPI: The patient is being seen today for ongoing management of the above. Today she reports she has not heard from nutritionist to shedule appt, so order resent. Pt to call on Friday if she has not heard from them by Thru say    BP weight and urine results all reviewed and noted. Patient  denies any bleeding and no rupture of membranes symptoms or regular contractions.   Patient is without complaints other than noted in her HPI. All questions were answered.  All lab and sonogram results have been reviewed. Comments:  12+2 wks,crl 70.1 mm,NB present,NT 1.3 mm,fhr 162 bpm,normal right ov,simple lt corpus luteal cyst 2.4 x 2.4 x 2.2 cm,unable to isualize fibroid on today"s ultrasound  Assessment:  1.  Pregnancy at [redacted]w[redacted]d,  Estimated Date of Delivery: 12/25/16 :                          2.  CHTN                        3.  A1/B DM, unknown control  Medication(s) Plans:  Continue Labetalol 200mg  BID  Treatment Plan:   Baby ASA @ 12wks: Valu.Nieves ]  Growth u/s @ 20, 28, 34, 38wks     2x/wk testing nst/sono @ 32wks    Deliver @  39wks (meds):   Return for HROB, 2nd IT. for appointment for high risk OB care  Meds ordered this encounter  Medications  . aspirin (ASPIRIN CHILDRENS) 81 MG chewable tablet    Sig: Chew 1 tablet (81 mg total) by mouth daily.    Dispense:  30 tablet    Refill:  6    Order Specific Question:   Supervising Provider    Answer:   Tania Ade H [[id number]]   Orders Placed This Encounter  Procedures  . Maternal Screen, Integrated #1  . POCT urinalysis dipstick

## 2016-06-14 NOTE — Progress Notes (Signed)
[place] 12+2 wks,crl 70.1 mm,NB present,NT 1.3 mm,fhr 162 bpm,normal right ov,simple lt corpus luteal cyst 2.4 x 2.4 x 2.2 cm,unable to visualize fibroid on today"s ultrasound

## 2016-06-17 LAB — MATERNAL SCREEN, INTEGRATED #1
Crown Rump Length: 70.1 mm
Gest. Age on Collection Date: 13 weeks
Maternal Age at EDD: 35.2 years
Nuchal Translucency (NT): 1.3 mm
Number of Fetuses: 1
PAPP-A Value: 1423.9 ng/mL
Weight: 224 [lb_av]

## 2016-06-27 ENCOUNTER — Telehealth: Payer: Self-pay | Admitting: Obstetrics & Gynecology

## 2016-06-27 ENCOUNTER — Encounter: Payer: Managed Care, Other (non HMO) | Attending: Advanced Practice Midwife | Admitting: Skilled Nursing Facility1

## 2016-06-27 DIAGNOSIS — O24419 Gestational diabetes mellitus in pregnancy, unspecified control: Secondary | ICD-10-CM | POA: Insufficient documentation

## 2016-06-27 DIAGNOSIS — Z3A00 Weeks of gestation of pregnancy not specified: Secondary | ICD-10-CM | POA: Diagnosis not present

## 2016-06-27 DIAGNOSIS — O24410 Gestational diabetes mellitus in pregnancy, diet controlled: Secondary | ICD-10-CM

## 2016-06-27 DIAGNOSIS — Z713 Dietary counseling and surveillance: Secondary | ICD-10-CM | POA: Insufficient documentation

## 2016-06-27 DIAGNOSIS — Z6841 Body Mass Index (BMI) 40.0 and over, adult: Secondary | ICD-10-CM | POA: Diagnosis not present

## 2016-06-27 NOTE — Telephone Encounter (Signed)
Pt c/o "discomfort in her [place]," feels this is coming from the baby laying on a nerve, no vaginal bleeding.  Pt also c/o stuffy nose, shortness of breathe especially when climbing stairs, did research and read that these symptoms were side effects from b/p med. Please advise.   Pt advised to push water and take Tylenol for the the buttocks pain.

## 2016-06-27 NOTE — Telephone Encounter (Signed)
Pt advised per Knute Neu, CNM, shortness of breathe, cough can be common during pregnancy but can make an appt. Pt was offered soonest available appt for tomorrow but states not able to come, will call back if symptom worsen or do not improve.

## 2016-06-27 NOTE — Telephone Encounter (Signed)
Pt called stating that she is pregnant and is having a lot of discomfort. Pt thinks that the baby might be on a nerve. Please contact pt

## 2016-06-28 ENCOUNTER — Encounter: Payer: Self-pay | Admitting: Skilled Nursing Facility1

## 2016-06-28 NOTE — Progress Notes (Signed)
  Patient was seen on 06/27/2016 for Gestational Diabetes self-management class at [facility]. The following learning objectives were met by the patient during this course:   States the definition of Gestational Diabetes  States why dietary management is important in controlling blood glucose  Describes the effects each nutrient has on blood glucose levels  Demonstrates ability to create a balanced meal plan  Demonstrates carbohydrate counting   States when to check blood glucose levels involving a total of 4 separate occurences in a day  Demonstrates proper blood glucose monitoring techniques  States the effect of stress and exercise on blood glucose levels  States the importance of limiting caffeine and abstaining from alcohol and smoking  Demonstrates the knowledge the glucometer provided in class may not be covered by their insurance and to call their insurance provider immediately after class to know which glucometer their insurance provider does cover as well as calling their physician the next day for a prescription to the glucometer their insurance does cover (if the one provided is not) as well as the lancets and strips for that meter.  Blood glucose monitor given: contour one next Lot # [id number] Exp: 2017/08/21 Blood glucose reading: 84  Patient instructed to monitor glucose levels: FBS: 60 - <90 1 hour: <140 2 hour: <120  *Patient received handouts:  Nutrition Diabetes and Pregnancy  Carbohydrate Counting List  Patient will be seen for follow-up as needed.

## 2016-06-29 ENCOUNTER — Telehealth: Payer: Self-pay | Admitting: Advanced Practice Midwife

## 2016-06-29 NOTE — Telephone Encounter (Signed)
Patient called stating she saw the nutritionist on Monday and was given enough strips to last until today. Patient uses contour next 1 and would like for [place] to refill prescription.  Lancets and test strips called to pharmacy on file.

## 2016-07-05 DIAGNOSIS — Z029 Encounter for administrative examinations, unspecified: Secondary | ICD-10-CM

## 2016-07-12 ENCOUNTER — Encounter: Payer: Managed Care, Other (non HMO) | Admitting: Advanced Practice Midwife

## 2016-07-13 ENCOUNTER — Encounter: Payer: Self-pay | Admitting: Advanced Practice Midwife

## 2016-07-13 ENCOUNTER — Ambulatory Visit (INDEPENDENT_AMBULATORY_CARE_PROVIDER_SITE_OTHER): Payer: Managed Care, Other (non HMO) | Admitting: Advanced Practice Midwife

## 2016-07-13 VITALS — BP 120/60 | HR 84 | Wt 228.0 lb

## 2016-07-13 DIAGNOSIS — Z1389 Encounter for screening for other disorder: Secondary | ICD-10-CM

## 2016-07-13 DIAGNOSIS — N898 Other specified noninflammatory disorders of vagina: Secondary | ICD-10-CM

## 2016-07-13 DIAGNOSIS — O10919 Unspecified pre-existing hypertension complicating pregnancy, unspecified trimester: Secondary | ICD-10-CM

## 2016-07-13 DIAGNOSIS — Z363 Encounter for antenatal screening for malformations: Secondary | ICD-10-CM

## 2016-07-13 DIAGNOSIS — Z3682 Encounter for antenatal screening for nuchal translucency: Secondary | ICD-10-CM

## 2016-07-13 DIAGNOSIS — Z331 Pregnant state, incidental: Secondary | ICD-10-CM

## 2016-07-13 DIAGNOSIS — O10912 Unspecified pre-existing hypertension complicating pregnancy, second trimester: Secondary | ICD-10-CM

## 2016-07-13 DIAGNOSIS — Z3A16 16 weeks gestation of pregnancy: Secondary | ICD-10-CM

## 2016-07-13 DIAGNOSIS — O0992 Supervision of high risk pregnancy, unspecified, second trimester: Secondary | ICD-10-CM

## 2016-07-13 DIAGNOSIS — O0990 Supervision of high risk pregnancy, unspecified, unspecified trimester: Secondary | ICD-10-CM

## 2016-07-13 DIAGNOSIS — O24410 Gestational diabetes mellitus in pregnancy, diet controlled: Secondary | ICD-10-CM

## 2016-07-13 LAB — POCT URINALYSIS DIPSTICK
Blood, UA: NEGATIVE
Glucose, UA: NEGATIVE
Leukocytes, UA: NEGATIVE
Nitrite, UA: NEGATIVE
Protein, UA: NEGATIVE

## 2016-07-13 MED ORDER — TERCONAZOLE 0.4 % VA CREA
1.0000 | TOPICAL_CREAM | Freq: Every day | VAGINAL | 0 refills | Status: DC
Start: 2016-07-13 — End: 2016-08-03

## 2016-07-13 NOTE — Patient Instructions (Signed)
Second Trimester of Pregnancy The second trimester is from week 13 through week 28 (months 4 through 6). The second trimester is often a time when you feel your best. Your body has also adjusted to being pregnant, and you begin to feel better physically. Usually, morning sickness has lessened or quit completely, you may have more energy, and you may have an increase in appetite. The second trimester is also a time when the fetus is growing rapidly. At the end of the sixth month, the fetus is about 9 inches long and weighs about 1 pounds. You will likely begin to feel the baby move (quickening) between 18 and 20 weeks of the pregnancy. Body changes during your second trimester Your body continues to go through many changes during your second trimester. The changes vary from woman to woman.  Your weight will continue to increase. You will notice your lower abdomen bulging out.  You may begin to get stretch marks on your hips, abdomen, and breasts.  You may develop headaches that can be relieved by medicines. The medicines should be approved by your health care provider.  You may urinate more often because the fetus is pressing on your bladder.  You may develop or continue to have heartburn as a result of your pregnancy.  You may develop constipation because certain hormones are causing the muscles that push waste through your intestines to slow down.  You may develop hemorrhoids or swollen, bulging veins (varicose veins).  You may have back pain. This is caused by:  Weight gain.  Pregnancy hormones that are relaxing the joints in your pelvis.  A shift in weight and the muscles that support your balance.  Your breasts will continue to grow and they will continue to become tender.  Your gums may bleed and may be sensitive to brushing and flossing.  Dark spots or blotches (chloasma, mask of pregnancy) may develop on your face. This will likely fade after the baby is born.  A dark line  from your belly button to the pubic area (linea nigra) may appear. This will likely fade after the baby is born.  You may have changes in your hair. These can include thickening of your hair, rapid growth, and changes in texture. Some women also have hair loss during or after pregnancy, or hair that feels dry or thin. Your hair will most likely return to normal after your baby is born. What to expect at prenatal visits During a routine prenatal visit:  You will be weighed to make sure you and the fetus are growing normally.  Your blood pressure will be taken.  Your abdomen will be measured to track your baby's growth.  The fetal heartbeat will be listened to.  Any test results from the previous visit will be discussed. Your health care provider may ask you:  How you are feeling.  If you are feeling the baby move.  If you have had any abnormal symptoms, such as leaking fluid, bleeding, severe headaches, or abdominal cramping.  If you are using any tobacco products, including cigarettes, chewing tobacco, and electronic cigarettes.  If you have any questions. Other tests that may be performed during your second trimester include:  Blood tests that check for:  Low iron levels (anemia).  Gestational diabetes (between 24 and 28 weeks).  Rh antibodies. This is to check for a protein on red blood cells (Rh factor).  Urine tests to check for infections, diabetes, or protein in the urine.  An ultrasound to  confirm the proper growth and development of the baby.  An amniocentesis to check for possible genetic problems.  Fetal screens for spina bifida and Down syndrome.  HIV (human immunodeficiency virus) testing. Routine prenatal testing includes screening for HIV, unless you choose not to have this test. Follow these instructions at home: Eating and drinking  Continue to eat regular, healthy meals.  Avoid raw meat, uncooked cheese, cat litter boxes, and soil used by cats. These  carry germs that can cause birth defects in the baby.  Take your prenatal vitamins.  Take 1500-2000 mg of calcium daily starting at the 20th week of pregnancy until you deliver your baby.  If you develop constipation:  Take over-the-counter or prescription medicines.  Drink enough fluid to keep your urine clear or pale yellow.  Eat foods that are high in fiber, such as fresh fruits and vegetables, whole grains, and beans.  Limit foods that are high in fat and processed sugars, such as fried and sweet foods. Activity  Exercise only as directed by your health care provider. Experiencing uterine cramps is a good sign to stop exercising.  Avoid heavy lifting, wear low heel shoes, and practice good posture.  Wear your seat belt at all times when driving.  Rest with your legs elevated if you have leg cramps or low back pain.  Wear a good support bra for breast tenderness.  Do not use hot tubs, steam rooms, or saunas. Lifestyle  Avoid all smoking, herbs, alcohol, and unprescribed drugs. These chemicals affect the formation and growth of the baby.  Do not use any products that contain nicotine or tobacco, such as cigarettes and e-cigarettes. If you need help quitting, ask your health care provider.  A sexual relationship may be continued unless your health care provider directs you otherwise. General instructions  Follow your health care provider's instructions regarding medicine use. There are medicines that are either safe or unsafe to take during pregnancy.  Take warm sitz baths to soothe any pain or discomfort caused by hemorrhoids. Use hemorrhoid cream if your health care provider approves.  If you develop varicose veins, wear support hose. Elevate your feet for 15 minutes, 3-4 times a day. Limit salt in your diet.  Visit your dentist if you have not gone yet during your pregnancy. Use a soft toothbrush to brush your teeth and be gentle when you floss.  Keep all follow-up  prenatal visits as told by your health care provider. This is important. Contact a health care provider if:  You have dizziness.  You have mild pelvic cramps, pelvic pressure, or nagging pain in the abdominal area.  You have persistent nausea, vomiting, or diarrhea.  You have a bad smelling vaginal discharge.  You have pain with urination. Get help right away if:  You have a fever.  You are leaking fluid from your vagina.  You have spotting or bleeding from your vagina.  You have severe abdominal cramping or pain.  You have rapid weight gain or weight loss.  You have shortness of breath with chest pain.  You notice sudden or extreme swelling of your face, hands, ankles, feet, or legs.  You have not felt your baby move in over an hour.  You have severe headaches that do not go away with medicine.  You have vision changes. Summary  The second trimester is from week 13 through week 28 (months 4 through 6). It is also a time when the fetus is growing rapidly.  Your body goes  through many changes during pregnancy. The changes vary from woman to woman.  Avoid all smoking, herbs, alcohol, and unprescribed drugs. These chemicals affect the formation and growth your baby.  Do not use any tobacco products, such as cigarettes, chewing tobacco, and e-cigarettes. If you need help quitting, ask your health care provider.  Contact your health care provider if you have any questions. Keep all prenatal visits as told by your health care provider. This is important. This information is not intended to replace advice given to you by your health care provider. Make sure you discuss any questions you have with your health care provider. Document Released: 08/02/2001 Document Revised: 01/14/2016 Document Reviewed: 10/09/2012 Elsevier Interactive Patient Education  2017 [employer].

## 2016-07-13 NOTE — Progress Notes (Signed)
Fetal Surveillance Testing today:  doppler   High Risk Pregnancy Diagnosis(es):   CHTN, Class A1/B DM  [id number] [redacted]w[redacted]d Estimated Date of Delivery: 12/25/16  Blood pressure 120/60, pulse 84, weight 228 lb (103.4 kg), last menstrual period 03/20/2016.  Urinalysis: Negative   HPI: The patient is being seen today for ongoing management of the above. Today she reports vilvar itching. Was rx'd flagyl for BV last month, just took the meds last week. No improvement.  Stilll itchy and irritated.     FBS usually <90, takes them sometimes at 3:30 am when she wakes, may not be true fasting.  Taking 1 hour after meals, most < 140.    BP weight and urine results   all reviewed and noted. Patient reports good fetal movement, denies any bleeding and no rupture of membranes symptoms or regular contractions. SSE:  Yeast d/c, wet prep yeast only  Fundal Height:   Fetal Heart rate:  145 Edema:  trace  Patient is without complaints other than noted in her HPI. All questions were answered.  All lab and sonogram results have been reviewed. Comments:    Assessment:  1.  Pregnancy at [redacted]w[redacted]d,  Estimated Date of Delivery: 12/25/16 :                          2.  CHTN, good control                        3.  A1/B DM; decent control.    4  Yeast  Medication(s) Plans:  ASA 81mg  daily; Labetalol 200mg  BID  Treatment Plan:  ]  Growth u/s @ 20, 28, 34, 38wks     2x/wk testing nst/sono @ 32wks    Deliver @  39wks (meds):Marland Kitchen  QID BS testing.  2nd IT today   Return in about 3 weeks (around 08/03/2016) for HROB, [id number]. for appointment for high risk OB care  Meds ordered this encounter  Medications  . terconazole (TERAZOL 7) 0.4 % vaginal cream    Sig: Place 1 applicator vaginally at bedtime.    Dispense:  45 g    Refill:  0    Order Specific Question:   Supervising Provider    Answer:   Tania Ade H [[id number]]   Orders Placed This Encounter  Procedures  . US OB Comp + 14 Wk  . Maternal Screen, Integrated  #2  . POCT urinalysis dipstick

## 2016-07-16 LAB — MATERNAL SCREEN, INTEGRATED #2
AFP MoM: 1.21
Alpha-Fetoprotein: 34.9 ng/mL
Crown Rump Length: 70.1 mm
DIA MoM: 0.73
DIA Value: 100.7 pg/mL
Estriol, Unconjugated: 1.01 ng/mL
Gest. Age on Collection Date: 13 weeks
Gestational Age: 17.1 weeks
Maternal Age at EDD: 35.2 years
Nuchal Translucency (NT): 1.3 mm
Nuchal Translucency MoM: 0.75
Number of Fetuses: 1
PAPP-A MoM: 2.04
PAPP-A Value: 1423.9 ng/mL
Test Results:: NEGATIVE
Weight: 224 [lb_av]
Weight: 224 [lb_av]
hCG MoM: 0.49
hCG Value: 10.1 IU/mL
uE3 MoM: 1.1

## 2016-08-03 ENCOUNTER — Ambulatory Visit (INDEPENDENT_AMBULATORY_CARE_PROVIDER_SITE_OTHER): Payer: Managed Care, Other (non HMO) | Admitting: Obstetrics and Gynecology

## 2016-08-03 ENCOUNTER — Encounter: Payer: Self-pay | Admitting: Obstetrics and Gynecology

## 2016-08-03 ENCOUNTER — Ambulatory Visit (INDEPENDENT_AMBULATORY_CARE_PROVIDER_SITE_OTHER): Payer: Managed Care, Other (non HMO)

## 2016-08-03 VITALS — BP 126/70 | HR 80 | Wt 229.6 lb

## 2016-08-03 DIAGNOSIS — Z3A20 20 weeks gestation of pregnancy: Secondary | ICD-10-CM

## 2016-08-03 DIAGNOSIS — O24419 Gestational diabetes mellitus in pregnancy, unspecified control: Secondary | ICD-10-CM

## 2016-08-03 DIAGNOSIS — Z1389 Encounter for screening for other disorder: Secondary | ICD-10-CM

## 2016-08-03 DIAGNOSIS — Z363 Encounter for antenatal screening for malformations: Secondary | ICD-10-CM

## 2016-08-03 DIAGNOSIS — Z331 Pregnant state, incidental: Secondary | ICD-10-CM

## 2016-08-03 DIAGNOSIS — O10912 Unspecified pre-existing hypertension complicating pregnancy, second trimester: Secondary | ICD-10-CM

## 2016-08-03 DIAGNOSIS — O34219 Maternal care for unspecified type scar from previous cesarean delivery: Secondary | ICD-10-CM

## 2016-08-03 DIAGNOSIS — O0992 Supervision of high risk pregnancy, unspecified, second trimester: Secondary | ICD-10-CM

## 2016-08-03 LAB — POCT URINALYSIS DIPSTICK
Blood, UA: NEGATIVE
Glucose, UA: NEGATIVE
Leukocytes, UA: NEGATIVE
Nitrite, UA: NEGATIVE

## 2016-08-03 NOTE — Progress Notes (Addendum)
High Risk Pregnancy HROB Diagnosis(es):   CHTN, Class A1/B DM  [id number] [redacted]w[redacted]d Estimated Date of Delivery: 12/25/16    HPI: The patient is being seen today for ongoing management of above. Today she reports lower back pain that began today and left buttocks pain that has been ongoing since the start of this pregnancy. She denies radiation down lower extremities or numbness.  Patient reports good fetal movement, denies any bleeding and no rupture of membranes symptoms or regular contractions. Patient did not report her blood sugars today Patient states she now wants to proceed with a repeat cesarean  BP weight and urine results reviewed and noted. Blood pressure 126/70, pulse 80, weight 229 lb 9.6 oz (104.1 kg), last menstrual period 03/20/2016.  Fundal Height:  Not indicated  Fetal Heart rate:  170 Physical Examination: Abdomen - soft, nontender, nondistended, no masses or organomegaly                                     Pelvic - examination not indicated                                     Edema:    Urinalysis: positive for trace protein  Fetal Surveillance Testing today:  None today ultrasound shows normal anatomic survey, female infant  Lab and sonogram results have been reviewed. Comments:   Assessment:  1.  Pregnancy at [redacted]w[redacted]d,  [id number]   :  Estimated Date of Delivery: 12/25/16                        2.  CHTN, Class A1/B DM                        3. Musculoskeletal back pain during pregnancy                         4. Prior cesarean section for repeat cesarean section Medication(s) Plans:   ASA 81 mg daily, labetalol 200mg  BID, no changes  Treatment Plan:  Advised weight loss to alleviate body/muscular pain, given strategies for low impact weight loss. Reassured patient that maintaining weight during pregnancy will not negatively impact pregnancy.   Patient states she would like 5 year IUD after this pregnancy, does not wish future pregnancies.   Follow up in 4 weeks for appointment  for high risk OB care,   By signing my name below, I, Sonum Patel, attest that this documentation has been prepared under the direction and in the presence of Jonnie Kind, MD. Electronically Signed: Sonum Patel, Education administrator. 08/03/16. 2:39 PM.  I personally performed the services described in this documentation, which was SCRIBED in my presence. The recorded information has been reviewed and considered accurate. It has been edited as necessary during review. Jonnie Kind, MD

## 2016-08-03 NOTE — Progress Notes (Signed)
[place] [id number] wks,cephalic,post pl gr 0,normal ov's bilat,cx 5.8 cm,svp of fluid 6.7 cm,fhr 170 bpm,ant fibroid 3.1 x 2.2 x 3.6 cm,anatomy complete,no obvious abnormalities seen

## 2016-08-11 ENCOUNTER — Ambulatory Visit (INDEPENDENT_AMBULATORY_CARE_PROVIDER_SITE_OTHER): Payer: Managed Care, Other (non HMO) | Admitting: Primary Care

## 2016-08-11 ENCOUNTER — Encounter: Payer: Self-pay | Admitting: Primary Care

## 2016-08-11 VITALS — BP 136/80 | HR 86 | Temp 98.2°F | Ht 61.0 in | Wt 230.4 lb

## 2016-08-11 DIAGNOSIS — Z23 Encounter for immunization: Secondary | ICD-10-CM | POA: Diagnosis not present

## 2016-08-11 DIAGNOSIS — R7303 Prediabetes: Secondary | ICD-10-CM | POA: Diagnosis not present

## 2016-08-11 DIAGNOSIS — O10019 Pre-existing essential hypertension complicating pregnancy, unspecified trimester: Secondary | ICD-10-CM | POA: Diagnosis not present

## 2016-08-11 DIAGNOSIS — Z0000 Encounter for general adult medical examination without abnormal findings: Secondary | ICD-10-CM | POA: Diagnosis not present

## 2016-08-11 DIAGNOSIS — I10 Essential (primary) hypertension: Secondary | ICD-10-CM

## 2016-08-11 LAB — COMPREHENSIVE METABOLIC PANEL
ALT: 17 U/L (ref 0–35)
AST: 17 U/L (ref 0–37)
Albumin: 3.7 g/dL (ref 3.5–5.2)
Alkaline Phosphatase: 28 U/L — ABNORMAL LOW (ref 39–117)
BUN: 11 mg/dL (ref 6–23)
CO2: 26 mEq/L (ref 19–32)
Calcium: 9 mg/dL (ref 8.4–10.5)
Chloride: 105 mEq/L (ref 96–112)
Creatinine, Ser: 0.71 mg/dL (ref 0.40–1.20)
GFR: 120.62 mL/min (ref >60.00)
Glucose, Bld: 93 mg/dL (ref 70–99)
Potassium: 3.8 mEq/L (ref 3.5–5.1)
Sodium: 137 mEq/L (ref 135–145)
Total Bilirubin: 0.3 mg/dL (ref 0.2–1.2)
Total Protein: 7 g/dL (ref 6.0–8.3)

## 2016-08-11 LAB — LIPID PANEL
Cholesterol: 199 mg/dL (ref 0–200)
HDL: 61.5 mg/dL (ref >39.00)
LDL Cholesterol: 117 mg/dL — ABNORMAL HIGH (ref 0–99)
NonHDL: 137.33
Total CHOL/HDL Ratio: 3
Triglycerides: 103 mg/dL (ref 0.0–149.0)
VLDL: 20.6 mg/dL (ref 0.0–40.0)

## 2016-08-11 LAB — TSH: TSH: 0.84 u[IU]/mL (ref 0.35–4.50)

## 2016-08-11 NOTE — Patient Instructions (Signed)
You were provided with a tetanus vaccination which will cover you for 10 years.  Complete lab work prior to leaving today. I will notify you of your results once received.   It's importance to improve your diet by reducing consumption of fast food, fried food, processed snack foods, sugary drinks. Increase consumption of fresh vegetables and fruits, whole grains, water.  Ensure you are drinking 64 ounces of water daily.  Start exercising. You should be getting 150 minutes of  exercise weekly.  Ensure you are consuming 64 ounces of water daily.  Follow up in 1 year for annual physical or sooner if needed.  It was a pleasure to see you today!

## 2016-08-11 NOTE — Addendum Note (Signed)
Addended by: Jacqualin Combes on: 08/11/2016 11:32 AM   Modules accepted: Orders

## 2016-08-11 NOTE — Progress Notes (Signed)
Subjective:    Patient ID: Tina Wall, female    DOB: November 23, 1980, 35 y.o.   MRN: [id number]  HPI  Ms. Tina Wall is a 35 year old female who presents today for complete physical.  Immunizations: -Tetanus: Completed in 2006, due today. -Influenza: Declines   Diet: She endorses a fair diet. Breakfast: Oatmeal, nuts, fruit Lunch: Subway, pasta Dinner: Fast food, meat, starch Snacks: None Desserts: Several times weekly Beverages: Water, sweet tea, limited soda  Exercise: She does not currently exercise Eye exam: Completed several years ago. Has noticed decrease in seeing things from afar. Dental exam: Completed last year. Pap Smear: Completed in 2016.   Review of Systems  Constitutional: Negative for unexpected weight change.  HENT: Positive for congestion. Negative for rhinorrhea.   Respiratory: Negative for cough and shortness of breath.   Cardiovascular: Negative for chest pain.  Gastrointestinal: Negative for constipation and diarrhea.  Genitourinary: Negative for difficulty urinating and menstrual problem.  Musculoskeletal: Positive for back pain. Negative for arthralgias and myalgias.  Skin: Negative for rash.  Allergic/Immunologic: Positive for environmental allergies.  Neurological: Negative for dizziness, numbness and headaches.  Psychiatric/Behavioral:       Denies concerns for anxiety and depression       Past Medical History:  Diagnosis Date  . Asthma   . Bronchitis   . GI bleeding   . HSV-2 (herpes simplex virus 2) infection   . Hx of chlamydia infection   . Hx of trichomoniasis      Social History   Social History  . Marital status: Single    Spouse name: N/A  . Number of children: N/A  . Years of education: N/A   Occupational History  . Not on file.   Social History Main Topics  . Smoking status: Never Smoker  . Smokeless tobacco: Never Used  . Alcohol use No     Comment: occasionally  . Drug use: No  . Sexual activity: Yes   Partners: Male    Birth control/ protection: None   Other Topics Concern  . Not on file   Social History Narrative  . No narrative on file    Past Surgical History:  Procedure Laterality Date  . CESAREAN SECTION    . FOOT SURGERY    . TONSILLECTOMY      Family History  Problem Relation Age of Onset  . Hypertension Mother   . Diabetes Mother   . Kidney disease Mother   . Cancer Father     trachea   . Alzheimer's disease Paternal Grandmother     Allergies  Allergen Reactions  . Ace Inhibitors Shortness Of Breath    Short of breath  . Latex Other (See Comments)    PT IS UNSURE OF WHETHER OR NOT THIS IS AN ALLERGY    Current Outpatient Prescriptions on File Prior to Visit  Medication Sig Dispense Refill  . albuterol (PROAIR HFA) 108 (90 BASE) MCG/ACT inhaler Inhale into the lungs. Reported on 10/08/2015    . aspirin (ASPIRIN CHILDRENS) 81 MG chewable tablet Chew 1 tablet (81 mg total) by mouth daily. 30 tablet 6  . labetalol (NORMODYNE) 200 MG tablet Take 1 tablet (200 mg total) by mouth 2 (two) times daily. 60 tablet 3  . Prenatal Vit-Fe Fumarate-FA (PRENATAL COMPLETE PO) Take by mouth.     No current facility-administered medications on file prior to visit.     BP 136/80   Pulse 86   Temp 98.2 F (36.8 C) (Oral)  Ht 5\' 1"  (1.549 m)   Wt 230 lb 6.4 oz (104.5 kg)   LMP 03/20/2016 (Exact Date)   SpO2 99%   BMI 43.53 kg/m    Objective:   Physical Exam  Constitutional: She is oriented to person, place, and time. She appears well-nourished.  HENT:  Right Ear: Tympanic membrane and ear canal normal.  Left Ear: Tympanic membrane and ear canal normal.  Nose: Nose normal.  Mouth/Throat: Oropharynx is clear and moist.  Eyes: Conjunctivae and EOM are normal. Pupils are equal, round, and reactive to light.  Neck: Neck supple. No thyromegaly present.  Cardiovascular: Normal rate and regular rhythm.   No murmur heard. Pulmonary/Chest: Effort normal and breath  sounds normal. She has no rales.  Abdominal: Soft. Bowel sounds are normal. There is no tenderness.  Musculoskeletal: Normal range of motion.  Lymphadenopathy:    She has no cervical adenopathy.  Neurological: She is alert and oriented to person, place, and time. She has normal reflexes. No cranial nerve deficit.  Skin: Skin is warm and dry. No rash noted.  Psychiatric: She has a normal mood and affect.          Assessment & Plan:

## 2016-08-11 NOTE — Assessment & Plan Note (Signed)
A1C in October stable. Discussed improvements in diet and exercise. Continue to monitor.

## 2016-08-11 NOTE — Assessment & Plan Note (Signed)
Stable in the office today, continue labetalol. Current pregnancy.

## 2016-08-11 NOTE — Assessment & Plan Note (Signed)
Td due, Tdap provided today. Declines influenza vaccination. Pap UTD. Discussed the importance of a healthy diet and regular exercise in order for weight loss, and to reduce the risk of other medical diseases. Exam unremarkable. Labs pending. Follow up in 1 year for annual exam.

## 2016-08-17 ENCOUNTER — Telehealth: Payer: Self-pay | Admitting: Women's Health

## 2016-08-17 NOTE — Telephone Encounter (Signed)
Patient called with complaints of wheezing. States she has asthma but has not tried using her inhaler. I advised her to use her inhaler and if that didn't help to give me a call back. Pt stated she would try it and let me know if it didn't help.

## 2016-08-24 ENCOUNTER — Ambulatory Visit (INDEPENDENT_AMBULATORY_CARE_PROVIDER_SITE_OTHER): Payer: Managed Care, Other (non HMO) | Admitting: Advanced Practice Midwife

## 2016-08-24 ENCOUNTER — Telehealth: Payer: Self-pay | Admitting: *Deleted

## 2016-08-24 ENCOUNTER — Encounter: Payer: Self-pay | Admitting: Advanced Practice Midwife

## 2016-08-24 VITALS — BP 125/58 | HR 85 | Temp 98.4°F | Wt 236.0 lb

## 2016-08-24 DIAGNOSIS — J40 Bronchitis, not specified as acute or chronic: Secondary | ICD-10-CM

## 2016-08-24 DIAGNOSIS — Z1389 Encounter for screening for other disorder: Secondary | ICD-10-CM

## 2016-08-24 DIAGNOSIS — J209 Acute bronchitis, unspecified: Secondary | ICD-10-CM | POA: Diagnosis not present

## 2016-08-24 DIAGNOSIS — Z331 Pregnant state, incidental: Secondary | ICD-10-CM

## 2016-08-24 LAB — POCT URINALYSIS DIPSTICK
Blood, UA: NEGATIVE
Glucose, UA: NEGATIVE
Ketones, UA: NEGATIVE
Leukocytes, UA: NEGATIVE
Nitrite, UA: NEGATIVE
Protein, UA: NEGATIVE

## 2016-08-24 MED ORDER — AZITHROMYCIN 250 MG PO TABS: 250.0000 mg | ORAL_TABLET | Freq: Every day | ORAL | 0 refills | Status: DC

## 2016-08-24 MED ORDER — PREDNISONE 10 MG PO TABS: 10.0000 mg | ORAL_TABLET | Freq: Every day | ORAL | 0 refills | Status: DC

## 2016-08-24 MED ORDER — BENZONATATE 200 MG PO CAPS: 200.0000 mg | ORAL_CAPSULE | Freq: Three times a day (TID) | ORAL | 0 refills | Status: DC | PRN

## 2016-08-24 NOTE — Progress Notes (Signed)
WORK IN FOR ASTHMA/CHEST WHEEZING/CONGESTION FOR > 1 WEEK  Has tried inhaler, not getting better.  Has cough, ribs hurt from coughing so much.   HEENT:  congestion Lungs w/exp wheezes bilaterally HRR&R Abdomen:  FHR 150 Legs:  Neg  IMP:  Asthma/bronchitis PLAN:   Meds ordered this encounter  Medications  . azithromycin (ZITHROMAX) 250 MG tablet    Sig: Take 1 tablet (250 mg total) by mouth daily. Take 2 today, the 1 a day for 4 days    Dispense:  6 tablet    Refill:  0    Order Specific Question:   Supervising Provider    Answer:   Elonda Husky, [name] [[id number]]  . benzonatate (TESSALON) 200 MG capsule    Sig: Take 1 capsule (200 mg total) by mouth 3 (three) times daily as needed for cough.    Dispense:  20 capsule    Refill:  0    Order Specific Question:   Supervising Provider    Answer:   Elonda Husky, [name] [[id number]]  . predniSONE (DELTASONE) 10 MG tablet    Sig: Take 1 tablet (10 mg total) by mouth daily with breakfast.    Dispense:  10 tablet    Refill:  0    Order Specific Question:   Supervising Provider    Answer:   Tania Ade H [[id number]]

## 2016-08-24 NOTE — Telephone Encounter (Signed)
Patient called stating she has tried using her inhaler for her chest congestion and tightness but it has not seemed to help. She is concerned she is getting bronchitis. She has sore ribs from coughing. I advised patient to schedule appointment to be seen. Transferred to appointments.

## 2016-08-30 ENCOUNTER — Encounter (HOSPITAL_COMMUNITY): Payer: Self-pay

## 2016-08-30 ENCOUNTER — Observation Stay (HOSPITAL_COMMUNITY)
Admission: AD | Admit: 2016-08-30 | Discharge: 2016-08-30 | Disposition: A | Discharge status: Home / Self Care | Payer: Managed Care, Other (non HMO) | Source: Ambulatory Visit | Attending: Obstetrics & Gynecology | Admitting: Obstetrics & Gynecology

## 2016-08-30 DIAGNOSIS — E119 Type 2 diabetes mellitus without complications: Secondary | ICD-10-CM | POA: Diagnosis not present

## 2016-08-30 DIAGNOSIS — Z7982 Long term (current) use of aspirin: Secondary | ICD-10-CM | POA: Insufficient documentation

## 2016-08-30 DIAGNOSIS — R197 Diarrhea, unspecified: Secondary | ICD-10-CM | POA: Diagnosis not present

## 2016-08-30 DIAGNOSIS — E871 Hypo-osmolality and hyponatremia: Principal | ICD-10-CM | POA: Diagnosis present

## 2016-08-30 DIAGNOSIS — I10 Essential (primary) hypertension: Secondary | ICD-10-CM | POA: Insufficient documentation

## 2016-08-30 DIAGNOSIS — Z79899 Other long term (current) drug therapy: Secondary | ICD-10-CM | POA: Insufficient documentation

## 2016-08-30 DIAGNOSIS — J45909 Unspecified asthma, uncomplicated: Secondary | ICD-10-CM | POA: Insufficient documentation

## 2016-08-30 DIAGNOSIS — R110 Nausea: Secondary | ICD-10-CM | POA: Diagnosis present

## 2016-08-30 HISTORY — DX: Essential (primary) hypertension: I10

## 2016-08-30 HISTORY — DX: Type 2 diabetes mellitus without complications: E11.9

## 2016-08-30 LAB — URINALYSIS, ROUTINE W REFLEX MICROSCOPIC
Bilirubin Urine: NEGATIVE
Glucose, UA: NEGATIVE mg/dL
Hgb urine dipstick: NEGATIVE
Ketones, ur: 5 mg/dL — AB
Leukocytes, UA: NEGATIVE
Nitrite: NEGATIVE
Protein, ur: NEGATIVE mg/dL
Specific Gravity, Urine: 1.015 (ref 1.005–1.030)
pH: 5 (ref 5.0–8.0)

## 2016-08-30 LAB — BASIC METABOLIC PANEL
Anion gap: 8 (ref 5–15)
Anion gap: 4 — ABNORMAL LOW (ref 5–15)
BUN: 7 mg/dL (ref 6–20)
BUN: 6 mg/dL (ref 6–20)
CO2: 19 mmol/L — ABNORMAL LOW (ref 22–32)
CO2: 20 mmol/L — ABNORMAL LOW (ref 22–32)
Calcium: 8 mg/dL — ABNORMAL LOW (ref 8.9–10.3)
Calcium: 7.8 mg/dL — ABNORMAL LOW (ref 8.9–10.3)
Chloride: 102 mmol/L (ref 101–111)
Chloride: 111 mmol/L (ref 101–111)
Creatinine, Ser: 0.67 mg/dL (ref 0.44–1.00)
Creatinine, Ser: 0.57 mg/dL (ref 0.44–1.00)
GFR calc Af Amer: >60 mL/min (ref >60)
GFR calc Af Amer: >60 mL/min (ref >60)
GFR calc non Af Amer: >60 mL/min (ref >60)
GFR calc non Af Amer: >60 mL/min (ref >60)
Glucose, Bld: 90 mg/dL (ref 65–99)
Glucose, Bld: 96 mg/dL (ref 65–99)
Potassium: 3.1 mmol/L — ABNORMAL LOW (ref 3.5–5.1)
Potassium: 3.3 mmol/L — ABNORMAL LOW (ref 3.5–5.1)
Sodium: 129 mmol/L — ABNORMAL LOW (ref 135–145)
Sodium: 135 mmol/L (ref 135–145)

## 2016-08-30 LAB — CBC WITH DIFFERENTIAL/PLATELET
Basophils Absolute: 0 10*3/uL (ref 0.0–0.1)
Basophils Relative: 0 %
Eosinophils Absolute: 0.1 10*3/uL (ref 0.0–0.7)
Eosinophils Relative: 3 %
HCT: 32.8 % — ABNORMAL LOW (ref 36.0–46.0)
Hemoglobin: 11 g/dL — ABNORMAL LOW (ref 12.0–15.0)
Lymphocytes Relative: 23 %
Lymphs Abs: 1.2 10*3/uL (ref 0.7–4.0)
MCH: 31 pg (ref 26.0–34.0)
MCHC: 33.5 g/dL (ref 30.0–36.0)
MCV: 92.4 fL (ref 78.0–100.0)
Monocytes Absolute: 0.2 10*3/uL (ref 0.1–1.0)
Monocytes Relative: 4 %
Neutro Abs: 3.5 10*3/uL (ref 1.7–7.7)
Neutrophils Relative %: 70 %
Platelets: 213 10*3/uL (ref 150–400)
RBC: 3.55 MIL/uL — ABNORMAL LOW (ref 3.87–5.11)
RDW: 14.2 % (ref 11.5–15.5)
WBC: 5 10*3/uL (ref 4.0–10.5)

## 2016-08-30 LAB — C DIFFICILE QUICK SCREEN W PCR REFLEX
C Diff antigen: NEGATIVE
C Diff interpretation: NOT DETECTED
C Diff toxin: NEGATIVE

## 2016-08-30 MED ORDER — ONDANSETRON HCL 4 MG/2ML IJ SOLN: 8.0000 mg | Freq: Three times a day (TID) | INTRAMUSCULAR | Status: DC | PRN

## 2016-08-30 MED ORDER — ASPIRIN 81 MG PO CHEW
81.0000 mg | CHEWABLE_TABLET | Freq: Every day | ORAL | Status: DC
  Administered 2016-08-30: 81 mg via ORAL
  Filled 2016-08-30 (×2): qty 1

## 2016-08-30 MED ORDER — BENZONATATE 100 MG PO CAPS
100.0000 mg | ORAL_CAPSULE | Freq: Three times a day (TID) | ORAL | Status: DC
  Administered 2016-08-30: 100 mg via ORAL
  Filled 2016-08-30 (×2): qty 1

## 2016-08-30 MED ORDER — LABETALOL HCL 100 MG PO TABS
200.0000 mg | ORAL_TABLET | Freq: Once | ORAL | Status: AC
Start: 2016-08-30 — End: 2016-08-30
  Administered 2016-08-30: 200 mg via ORAL
  Filled 2016-08-30: qty 2

## 2016-08-30 MED ORDER — GUAIFENESIN-CODEINE 100-10 MG/5ML PO SOLN
10.0000 mL | Freq: Four times a day (QID) | ORAL | Status: DC | PRN
  Administered 2016-08-30: 10 mL via ORAL
  Filled 2016-08-30: qty 10

## 2016-08-30 MED ORDER — SODIUM CHLORIDE 0.9 % IV BOLUS (SEPSIS)
1000.0000 mL | Freq: Once | INTRAVENOUS | Status: AC
  Administered 2016-08-30: 1000 mL via INTRAVENOUS

## 2016-08-30 MED ORDER — ALBUTEROL SULFATE (2.5 MG/3ML) 0.083% IN NEBU
2.5000 mg | INHALATION_SOLUTION | RESPIRATORY_TRACT | Status: DC | PRN
  Administered 2016-08-30: 2.5 mg via RESPIRATORY_TRACT
  Filled 2016-08-30: qty 3

## 2016-08-30 MED ORDER — ONDANSETRON HCL 4 MG/2ML IJ SOLN: 4.0000 mg | Freq: Once | INTRAMUSCULAR | Status: DC

## 2016-08-30 MED ORDER — POTASSIUM CHLORIDE CRYS ER 20 MEQ PO TBCR
40.0000 meq | EXTENDED_RELEASE_TABLET | Freq: Once | ORAL | Status: DC
  Filled 2016-08-30: qty 2

## 2016-08-30 MED ORDER — ONDANSETRON 4 MG PO TBDP
4.0000 mg | ORAL_TABLET | Freq: Once | ORAL | Status: AC
  Administered 2016-08-30: 4 mg via ORAL
  Filled 2016-08-30: qty 1

## 2016-08-30 MED ORDER — LABETALOL HCL 200 MG PO TABS
200.0000 mg | ORAL_TABLET | Freq: Two times a day (BID) | ORAL | Status: DC
  Administered 2016-08-30: 200 mg via ORAL
  Filled 2016-08-30 (×2): qty 1

## 2016-08-30 MED ORDER — POTASSIUM CHLORIDE CRYS ER 20 MEQ PO TBCR
40.0000 meq | EXTENDED_RELEASE_TABLET | Freq: Once | ORAL | Status: AC
  Administered 2016-08-30: 40 meq via ORAL
  Filled 2016-08-30: qty 2

## 2016-08-30 MED ORDER — SODIUM CHLORIDE 0.9 % IV SOLN
INTRAVENOUS | Status: DC
  Administered 2016-08-30 (×2): via INTRAVENOUS

## 2016-08-30 MED ORDER — MENTHOL 3 MG MT LOZG
1.0000 | LOZENGE | OROMUCOSAL | Status: DC | PRN
Start: 2016-08-30 — End: 2016-08-31

## 2016-08-30 MED ORDER — ACETAMINOPHEN 325 MG PO TABS: 650.0000 mg | ORAL_TABLET | ORAL | Status: DC | PRN

## 2016-08-30 MED ORDER — PRENATAL MULTIVITAMIN CH: 1.0000 | ORAL_TABLET | Freq: Every day | ORAL | Status: DC

## 2016-08-30 MED ORDER — SODIUM CHLORIDE 0.9 % IV BOLUS (SEPSIS): 1000.0000 mL | Freq: Once | INTRAVENOUS | Status: DC

## 2016-08-30 MED ORDER — ZOLPIDEM TARTRATE 5 MG PO TABS: 5.0000 mg | ORAL_TABLET | Freq: Every evening | ORAL | Status: DC | PRN

## 2016-08-30 MED ORDER — CALCIUM CARBONATE ANTACID 500 MG PO CHEW: 2.0000 | CHEWABLE_TABLET | ORAL | Status: DC | PRN

## 2016-08-30 MED ORDER — BENZONATATE 100 MG PO CAPS
200.0000 mg | ORAL_CAPSULE | Freq: Three times a day (TID) | ORAL | Status: DC | PRN
  Administered 2016-08-30: 200 mg via ORAL
  Filled 2016-08-30 (×2): qty 2

## 2016-08-30 NOTE — MAU Note (Signed)
Pt presents stating around 7pm she had watery diarrhea 4 times and nausea. States she had several more episodes at midnight and again at 2am. States she has had watery diarrhea 5-6 times since 2am. Also having pain in lower left side. Denies leaking of fluid or vaginal bleeding. Reports good fetal movement.

## 2016-08-30 NOTE — Discharge Instructions (Signed)

## 2016-08-30 NOTE — MAU Provider Note (Deleted)
History     CSN: [id number]  Arrival date and time: 08/30/16 0510   None     Chief Complaint  Patient presents with  . Nausea  . Diarrhea   HPI  Patient is a 36 y.o. G4P3003 at [redacted]w[redacted]d who presents with nausea and diarrhea x 24 hours. Patient notes that she has had approximately 20 + episodes of watery non bloody diarrhea since yesterday morning. Admits to nausea and inability to tolerate much PO. Denies any vomiting at this time. Admits to some intermittent diffuse abdominal pain when she is having a bowel movement. No sick contacts. Of note, she finished a 5 day course of antibiotics (Z Pak) for bronchitis yesterday. Denies any LOF, vaginal bleeding, vaginal discharge, contractions. + fetal movement.   OB History    Gravida Para Term Preterm AB Living   4 3 3     3    SAB TAB Ectopic Multiple Live Births           3      Past Medical History:  Diagnosis Date  . Asthma   . Bronchitis   . Diabetes mellitus without complication ([place])   . GI bleeding   . HSV-2 (herpes simplex virus 2) infection   . Hx of chlamydia infection   . Hx of trichomoniasis   . Hypertension     Past Surgical History:  Procedure Laterality Date  . CESAREAN SECTION    . FOOT SURGERY    . TONSILLECTOMY      Family History  Problem Relation Age of Onset  . Hypertension Mother   . Diabetes Mother   . Kidney disease Mother   . Cancer Father     trachea   . Alzheimer's disease Paternal Grandmother     Social History  Substance Use Topics  . Smoking status: Never Smoker  . Smokeless tobacco: Never Used  . Alcohol use No     Comment: occasionally    Allergies:  Allergies  Allergen Reactions  . Ace Inhibitors Shortness Of Breath    Short of breath  . Latex Other (See Comments)    PT IS UNSURE OF WHETHER OR NOT THIS IS AN ALLERGY    Prescriptions Prior to Admission  Medication Sig Dispense Refill Last Dose  . albuterol (PROAIR HFA) 108 (90 BASE) MCG/ACT inhaler Inhale into the lungs.  Reported on 10/08/2015   Taking  . aspirin (ASPIRIN CHILDRENS) 81 MG chewable tablet Chew 1 tablet (81 mg total) by mouth daily. 30 tablet 6 Taking  . azithromycin (ZITHROMAX) 250 MG tablet Take 1 tablet (250 mg total) by mouth daily. Take 2 today, the 1 a day for 4 days 6 tablet 0   . benzonatate (TESSALON) 200 MG capsule Take 1 capsule (200 mg total) by mouth 3 (three) times daily as needed for cough. 20 capsule 0   . labetalol (NORMODYNE) 200 MG tablet Take 1 tablet (200 mg total) by mouth 2 (two) times daily. 60 tablet 3 Taking  . predniSONE (DELTASONE) 10 MG tablet Take 1 tablet (10 mg total) by mouth daily with breakfast. 10 tablet 0   . Prenatal Vit-Fe Fumarate-FA (PRENATAL COMPLETE PO) Take by mouth.   Taking    Review of Systems  Constitutional: Positive for appetite change (decreased appetite). Negative for fever.  HENT: Negative for sore throat.   Respiratory: Negative for cough, chest tightness, shortness of breath and wheezing.   Cardiovascular: Negative for chest pain, palpitations and leg swelling.  Gastrointestinal: Positive for abdominal  pain (intermittent with BMs), diarrhea and nausea. Negative for blood in stool, constipation and vomiting.  Genitourinary: Negative for dysuria.  Musculoskeletal: Negative for myalgias.  Neurological: Negative for weakness and headaches.   Physical Exam   Blood pressure 129/73, pulse 92, temperature 97.7 F (36.5 C), temperature source Oral, resp. rate 20, last menstrual period 03/20/2016.  Physical Exam  Constitutional: She is oriented to person, place, and time. She appears well-developed and well-nourished.  HENT:  Head: Normocephalic and atraumatic.  Right Ear: External ear normal.  Left Ear: External ear normal.  Nose: Nose normal.  Mouth/Throat: Oropharynx is clear and moist.  Dry mucous membranes  Eyes: Conjunctivae and EOM are normal. Pupils are equal, round, and reactive to light.  Neck: Normal range of motion.   Cardiovascular: Normal rate, regular rhythm and intact distal pulses.   Respiratory: Effort normal. No respiratory distress.  GI: She exhibits distension (gravid abdomen). There is no tenderness.  Musculoskeletal: Normal range of motion. She exhibits no edema or tenderness.  Neurological: She is alert and oriented to person, place, and time. She exhibits normal muscle tone.  Skin: Skin is warm. No rash noted.    MAU Course  Procedures  MDM Vitals within normal limits. Physical exam showing benign abdominal exam but patient has dry mucous membranes. FHT reactive and normal.  Due to hx of recent antibiotic use, obtained C. Diff lab. Additionally obtained stool pcr.  CBC, BMP obtained Gave 1 L NS bolus and Zofran  Assessment and Plan   Nausea and Diarrhea: Likely viral gastroenteritis vs gastritis from antibiotic use. Other differentials include C. Diff given recent antibiotic use. Vitals reassuring but patient does appear dry on exam and is having difficulty tolerating PO. No leukocytosis.  - Zofran 4mg  and 1 L NS bolus - C. Diff and Stool PCR obtained - Dr. Vanetta Shawl to assess patient and see how she is after Zofran and fluids to determine if she is stable for DC   Hypokalemia and Hyponatremia: K of 3.1 and Na of 129, likely from GI fluid losses.  - Replete with 40 meq of [place] as above  Carlyle Dolly 08/30/2016, 6:50 AM

## 2016-08-30 NOTE — Progress Notes (Signed)
Pt ambulated off unit with daughter at side for discharge. No further concerns at this time.

## 2016-08-30 NOTE — H&P (Signed)
ANTEPARTUM ADMISSION HISTORY AND PHYSICAL NOTE   History of Present Illness: Tina Wall is a 36 y.o. [phone] at [redacted]w[redacted]d admitted for hyponatremia 2/2 diarrhea.   States has nausea and diarrhea x 24 hours. Patient notes that she has had approximately 20 + episodes of watery non bloody diarrhea since yesterday morning. Admits to nausea and inability to tolerate much PO. Denies any vomiting at this time. Admits to some intermittent diffuse abdominal pain when she is having a bowel movement. No sick contacts. Of note, she finished a 5 day course of antibiotics (Z Pak) for bronchitis yesterday. No raw food, no undercooked meats, no seafood. Ate at [facility] prior to this starting. Denies dizziness, lightheadedness, weakness or fatigue. Denies any LOF, vaginal bleeding, vaginal discharge, contractions. + fetal movement.  BMs are described as loose stools, brown, without blood.     Patient Active Problem List   Diagnosis Date Noted  . Hyponatremia with extracellular fluid depletion 08/30/2016  . Preventative health care 08/11/2016  . Gestational diabetes mellitus, class A1/B 05/27/2016  . Chronic hypertension affecting pregnancy 05/23/2016  . Supervision of high risk pregnancy, antepartum 05/23/2016  . Pregnancy with history of cesarean section, antepartum 05/23/2016  . Fibroid 05/23/2016  . Asthma 04/12/2016  . Borderline diabetes 04/02/2015  . Essential hypertension 04/02/2015  . HSV-2 (herpes simplex virus 2) infection 12/31/2012    Past Medical History:  Diagnosis Date  . Asthma   . Bronchitis   . Diabetes mellitus without complication ([place])   . GI bleeding   . HSV-2 (herpes simplex virus 2) infection   . Hx of chlamydia infection   . Hx of trichomoniasis   . Hypertension     Past Surgical History:  Procedure Laterality Date  . CESAREAN SECTION    . FOOT SURGERY    . TONSILLECTOMY      OB History  Gravida Para Term Preterm AB Living  4 3 3     3   SAB TAB Ectopic  Multiple Live Births          3    # Outcome Date GA Lbr Len/2nd Weight Sex Delivery Anes PTL Lv  4 Current           3 Term 09/01/05 [redacted]w[redacted]d  6 lb 12 oz (3.062 kg) F CS-LTranv  N LIV  2 Term 10/25/01 [redacted]w[redacted]d  8 lb 6 oz (3.799 kg) F CS-Classical  N      Complications: Fetal Intolerance  1 Term 08/29/96 [redacted]w[redacted]d  6 lb 12 oz (3.062 kg) F Vag-Spont EPI Y LIV      Social History   Social History  . Marital status: Single    Spouse name: N/A  . Number of children: N/A  . Years of education: N/A   Social History Main Topics  . Smoking status: Never Smoker  . Smokeless tobacco: Never Used  . Alcohol use No     Comment: occasionally  . Drug use: No  . Sexual activity: Yes    Partners: Male    Birth control/ protection: None   Other Topics Concern  . None   Social History Narrative  . None    Family History  Problem Relation Age of Onset  . Hypertension Mother   . Diabetes Mother   . Kidney disease Mother   . Cancer Father     trachea   . Alzheimer's disease Paternal Grandmother     Allergies  Allergen Reactions  . Ace Inhibitors Shortness Of Breath  Short of breath  . Latex Other (See Comments)    PT IS UNSURE OF WHETHER OR NOT THIS IS AN ALLERGY    Prescriptions Prior to Admission  Medication Sig Dispense Refill Last Dose  . albuterol (PROAIR HFA) 108 (90 BASE) MCG/ACT inhaler Inhale into the lungs. Reported on 10/08/2015   08/29/2016 at Unknown time  . aspirin (ASPIRIN CHILDRENS) 81 MG chewable tablet Chew 1 tablet (81 mg total) by mouth daily. 30 tablet 6 08/29/2016 at Unknown time  . benzonatate (TESSALON) 200 MG capsule Take 1 capsule (200 mg total) by mouth 3 (three) times daily as needed for cough. 20 capsule 0 08/29/2016 at Unknown time  . labetalol (NORMODYNE) 200 MG tablet Take 1 tablet (200 mg total) by mouth 2 (two) times daily. 60 tablet 3 08/29/2016 at Unknown time  . predniSONE (DELTASONE) 10 MG tablet Take 1 tablet (10 mg total) by mouth daily with breakfast. 10  tablet 0 08/29/2016 at Unknown time  . Prenatal Vit-Fe Fumarate-FA (PRENATAL COMPLETE PO) Take by mouth.   08/29/2016 at Unknown time  . [DISCONTINUED] azithromycin (ZITHROMAX) 250 MG tablet Take 1 tablet (250 mg total) by mouth daily. Take 2 today, the 1 a day for 4 days (Patient not taking: Reported on 08/30/2016) 6 tablet 0 Completed Course at Unknown time    Review of Systems  Constitutional: Positive for appetite change (decreased appetite). Negative for fever.  HENT: Negative for sore throat.   Respiratory: Negative for cough, chest tightness, shortness of breath and wheezing.   Cardiovascular: Negative for chest pain, palpitations and leg swelling.  Gastrointestinal: Positive for abdominal pain (intermittent with BMs), diarrhea and nausea. Negative for blood in stool, constipation and vomiting.  Genitourinary: Negative for dysuria.  Musculoskeletal: Negative for myalgias.  Neurological: Negative for weakness and headaches.   Vitals:  BP 120/62   Pulse 93   Temp 97.7 F (36.5 C) (Oral)   Resp 20   LMP 03/20/2016 (Exact Date)    Physical Examination: Constitutional: She is oriented to person, place, and time. She appears well-developed and well-nourished.  Head: Normocephalic and atraumatic.  Right Ear: External ear normal.  Left Ear: External ear normal.  Nose: Nose normal.  Mouth/Throat: Oropharynx is clear without erythema or exudates. Dry mucous membranes  Eyes: Conjunctivae and EOM are normal. Pupils are equal, round, and reactive to light.  Neck: Normal range of motion.  Cardiovascular: Normal rate, regular rhythm and intact distal pulses.   No murmurs Respiratory: Effort normal. No respiratory distress.  CTAB. GI: Gravida equal to dates. There is no tenderness.  Hyperactive bowel sounds diffusely. Musculoskeletal: Normal range of motion. She exhibits no edema or tenderness.  Neurological: She is alert and oriented to person, place, and time. She exhibits normal muscle tone.   Skin: Skin is warm, dry. No rash noted.   Labs:  Results for orders placed or performed during the hospital encounter of 08/30/16 (from the past 24 hour(s))  Basic metabolic panel   Collection Time: 08/30/16  7:21 AM  Result Value Ref Range   Sodium 129 (L) 135 - 145 mmol/L   Potassium 3.1 (L) 3.5 - 5.1 mmol/L   Chloride 102 101 - 111 mmol/L   CO2 19 (L) 22 - 32 mmol/L   Glucose, Bld 90 65 - 99 mg/dL   BUN 7 6 - 20 mg/dL   Creatinine, Ser 0.67 0.44 - 1.00 mg/dL   Calcium 8.0 (L) 8.9 - 10.3 mg/dL   GFR calc non Af Amer >60 >  60 mL/min   GFR calc Af Amer >60 >60 mL/min   Anion gap 8 5 - 15  CBC with Differential/Platelet   Collection Time: 08/30/16  7:21 AM  Result Value Ref Range   WBC 5.0 4.0 - 10.5 K/uL   RBC 3.55 (L) 3.87 - 5.11 MIL/uL   Hemoglobin 11.0 (L) 12.0 - 15.0 g/dL   HCT 32.8 (L) 36.0 - 46.0 %   MCV 92.4 78.0 - 100.0 fL   MCH 31.0 26.0 - 34.0 pg   MCHC 33.5 30.0 - 36.0 g/dL   RDW 14.2 11.5 - 15.5 %   Platelets 213 150 - 400 K/uL   Neutrophils Relative % 70 %   Neutro Abs 3.5 1.7 - 7.7 K/uL   Lymphocytes Relative 23 %   Lymphs Abs 1.2 0.7 - 4.0 K/uL   Monocytes Relative 4 %   Monocytes Absolute 0.2 0.1 - 1.0 K/uL   Eosinophils Relative 3 %   Eosinophils Absolute 0.1 0.0 - 0.7 K/uL   Basophils Relative 0 %   Basophils Absolute 0.0 0.0 - 0.1 K/uL    Imaging Studies: US Ob Comp + 14 Wk  Result Date: 08/05/2016  SECOND [facility]IMESTER SONOGRAM KAYSON GANGULY is in the office for second trimester sonogram. She is a 36 y.o. year old G12P3003 with Estimated Date of Delivery: 12/25/16 by LMP now at  [redacted]w[redacted]d weeks gestation. Thus far the pregnancy has been complicated by CHTN,GDM,fibroid. GESTATION:SINGLETON PRESENTATION: cephalic FETAL ACTIVITY:          Heart rate         170          The fetus is active. AMNIOTIC FLUID: The amniotic fluid volume is  normal, SDP : 6.7 cm. PLACENTA LOCALIZATION:  posterior GRADE 0 CERVIX: Measures 5.8 cm ADNEXA: The ovaries are normal.  GESTATIONAL AGE AND  BIOMETRICS: Gestational criteria: Estimated Date of Delivery: 12/25/16 by LMP now at [redacted]w[redacted]d Previous Scans:2          BIPARIETAL DIAMETER           4.8 cm         20+4 weeks HEAD CIRCUMFERENCE           17.37 cm         19+6 weeks ABDOMINAL CIRCUMFERENCE           13.84 cm         19+2 weeks FEMUR LENGTH           3.13 cm         19+5 weeks                                                       AVERAGE EGA(BY THIS SCAN):  19+6 weeks                                                 ESTIMATED FETAL WEIGHT:       294  grams ANATOMICAL SURVEY  COMMENTS CEREBRAL VENTRICLES yes normal  CHOROID PLEXUS yes normal  CEREBELLUM yes normal  CISTERNA MAGNA yes normal  NUCHAL REGION yes normal  ORBITS yes normal  NASAL BONE yes normal  NOSE/LIP yes normal  FACIAL PROFILE yes normal  4 CHAMBERED HEART yes normal  OUTFLOW TRACTS yes normal  DIAPHRAGM yes normal  STOMACH yes normal  RENAL REGION yes normal  BLADDER yes normal  CORD INSERTION yes normal  3 VESSEL CORD yes normal  SPINE yes normal  ARMS/HANDS yes normal  LEGS/FEET yes normal  GENITALIA yes normal Female      SUSPECTED ABNORMALITIES:  no QUALITY OF SCAN: satisfactory TECHNICIAN COMMENTS: [place] [id number] wks,cephalic,post pl gr 0,normal ov's bilat,cx 5.8 cm,svp of fluid 6.7 cm,fhr 170 bpm,ant fibroid 3.1 x 2.2 x 3.6 cm,anatomy complete,no obvious abnormalities seen A copy of this report including all images has been saved and backed up to a second source for retrieval if needed. All measures and details of the anatomical scan, placentation, fluid volume and pelvic anatomy are contained in that report. Amber Heide Guile 08/03/2016 2:15 PM Clinical Impression and recommendations: I have reviewed the sonogram results above, combined with the patient's current clinical course, below are my impressions and any appropriate recommendations for management based on the sonographic findings. 1.  [id number]  Estimated Date of Delivery: 12/25/16 by  LMP and confirmed by today's sonographic dating 2.  Normal fetal sonographic findings, specifically normal detailed anatomical evaluation,      no abnormalities noted 3.  Normal general sonographic findings Recommend routine prenatal care based on this sonogram or as clinically indicated FERGUSON,JOHN V 08/05/2016 9:36 PM     Assessment and Plan: Patient Active Problem List   Diagnosis Date Noted  . Hyponatremia with extracellular fluid depletion 08/30/2016  . Preventative health care 08/11/2016  . Gestational diabetes mellitus, class A1/B 05/27/2016  . Chronic hypertension affecting pregnancy 05/23/2016  . Supervision of high risk pregnancy, antepartum 05/23/2016  . Pregnancy with history of cesarean section, antepartum 05/23/2016  . Fibroid 05/23/2016  . Asthma 04/12/2016  . Borderline diabetes 04/02/2015  . Essential hypertension 04/02/2015  . HSV-2 (herpes simplex virus 2) infection 12/31/2012   Admit to Antenatal IVF hydration with NS 0.9% Potassium supplementation Zofran Breathing treatment for asthma with superimposed bronchitis Recheck lytes at noon, if improved and patient feels better, can go home. Stool cultures (unlikely C.Diff, no leukocytosis, BMs are not watery or foul odorous) Routine antenatal care  Katherine Basset, DO OB Fellow Faculty Practice, [facility]

## 2016-08-30 NOTE — Discharge Summary (Signed)
[facility] Antenatal Discharge Summary     Patient Name: Tina Wall DOB: December 03, 1980 MRN: [id number]  Date of admission: 08/30/2016 Date of discharge: 08/30/2016  Admitting diagnosis: [redacted]w[redacted]d, Diarrhea and Nausea  Intrauterine pregnancy: [redacted]w[redacted]d     Secondary diagnosis:  Active Problems:   Hyponatremia with extracellular fluid depletion  Additional problems:  Patient Active Problem List   Diagnosis Date Noted  . Hyponatremia with extracellular fluid depletion 08/30/2016  . Preventative health care 08/11/2016  . Gestational diabetes mellitus, class A1/B 05/27/2016  . Chronic hypertension affecting pregnancy 05/23/2016  . Supervision of high risk pregnancy, antepartum 05/23/2016  . Pregnancy with history of cesarean section, antepartum 05/23/2016  . Fibroid 05/23/2016  . Asthma 04/12/2016  . Borderline diabetes 04/02/2015  . Essential hypertension 04/02/2015  . HSV-2 (herpes simplex virus 2) infection 12/31/2012      Discharge diagnosis: [place] Hospital course: Patient was admitted for observation due to nausea and diarrhea with hypokalemia and hyponatremia. Vital signs were stable throughout hospitalization. Patient required IV fluids and antiemetics. Potassium was repleted. C. Diff testing was negative. Stool culture were collected and results pending. Prior to discharge, electrolytes were within normal limits, patient was eating 100% of meals and diarrhea was improved. She was discharged with return precautions.   Physical exam  Vitals:   08/30/16 0557 08/30/16 0908 08/30/16 1300 08/30/16 1924  BP: 129/73 120/62 128/74 128/69  Pulse: 92 93 94 83  Resp: 20  18 20   Temp: 97.7 F (36.5 C)  98 F (36.7 C) 98.3 F (36.8 C)  TempSrc: Oral  Oral Oral  SpO2:   100% 100%  Weight:   107 kg (236 lb)   Height:   5\' 1"  (1.549 m)    General: In NAD, well developed, well nourished Cardiovascular: RRR, normal s1 and s2, no rubs,  gallops, or murmurs Respiratory: normal work of breathing  Abdomen: soft, non-tender,non-distended, +BS, gravid abdomen Extremities: no edema MSK: normal ROM  Neuro: A&O x3, no gross motor defecits  Psych: appropriate mood and affect   Labs: Lab Results  Component Value Date   WBC 5.0 08/30/2016   HGB 11.0 (L) 08/30/2016   HCT 32.8 (L) 08/30/2016   MCV 92.4 08/30/2016   PLT 213 08/30/2016   CMP Latest Ref Rng & Units 08/30/2016  Glucose 65 - 99 mg/dL 96  BUN 6 - 20 mg/dL 6  Creatinine 0.44 - 1.00 mg/dL 0.57  Sodium 135 - 145 mmol/L 135  Potassium 3.5 - 5.1 mmol/L 3.3(L)  Chloride 101 - 111 mmol/L 111  CO2 22 - 32 mmol/L 20(L)  Calcium 8.9 - 10.3 mg/dL 7.8(L)  Total Protein 6.0 - 8.3 g/dL -  Total Bilirubin 0.2 - 1.2 mg/dL -  Alkaline Phos 39 - 117 U/L -  AST 0 - 37 U/L -  ALT 0 - 35 U/L -    Discharge instruction: per After Visit Summary and "Baby and Me Booklet".  After visit meds:  Allergies as of 08/30/2016      Reactions   Ace Inhibitors Shortness Of Breath   Short of breath   Latex Other (See Comments)   PT IS UNSURE OF WHETHER OR NOT THIS IS AN ALLERGY      Medication List    TAKE these medications   aspirin 81  MG chewable tablet Commonly known as:  ASPIRIN CHILDRENS Chew 1 tablet (81 mg total) by mouth daily.   benzonatate 200 MG capsule Commonly known as:  TESSALON Take 1 capsule (200 mg total) by mouth 3 (three) times daily as needed for cough.   labetalol 200 MG tablet Commonly known as:  NORMODYNE Take 1 tablet (200 mg total) by mouth 2 (two) times daily.   predniSONE 10 MG tablet Commonly known as:  DELTASONE Take 1 tablet (10 mg total) by mouth daily with breakfast.   PRENATAL COMPLETE PO Take by mouth.   PROAIR HFA 108 (90 Base) MCG/ACT inhaler Generic drug:  albuterol Inhale into the lungs. Reported on 10/08/2015       Diet: low salt diet Follow up Appt: Future Appointments Date Time Provider [place]  08/23/2017 8:15 AM  Pleas Koch, NP [id number]   08/30/2016 Carlyle Dolly, MD   OB FELLOW DISCHARGE ATTESTATION  I have seen and examined this patient and agree with above documentation in the resident's note.   Katherine Basset, DO OB Fellow 08/30/2016  9:26 PM

## 2016-08-30 NOTE — Progress Notes (Signed)
Pt given discharge instructions. Pt verbalized understanding.

## 2016-08-30 NOTE — Progress Notes (Signed)
Pt BP 140/100 educated patient and gave scheduled meds.

## 2016-08-30 NOTE — Progress Notes (Signed)
Notified of pt arrival in MAU and complaint. Will come see pt when she is finished in labor and delivery.

## 2016-08-31 ENCOUNTER — Encounter: Payer: Managed Care, Other (non HMO) | Admitting: Advanced Practice Midwife

## 2016-08-31 LAB — GASTROINTESTINAL PANEL BY PCR, STOOL (REPLACES STOOL CULTURE)

## 2016-09-01 ENCOUNTER — Ambulatory Visit (INDEPENDENT_AMBULATORY_CARE_PROVIDER_SITE_OTHER): Payer: Managed Care, Other (non HMO) | Admitting: Obstetrics & Gynecology

## 2016-09-01 ENCOUNTER — Encounter: Payer: Self-pay | Admitting: Obstetrics & Gynecology

## 2016-09-01 VITALS — BP 122/60 | HR 89 | Wt 231.0 lb

## 2016-09-01 DIAGNOSIS — O10912 Unspecified pre-existing hypertension complicating pregnancy, second trimester: Secondary | ICD-10-CM

## 2016-09-01 DIAGNOSIS — O0992 Supervision of high risk pregnancy, unspecified, second trimester: Secondary | ICD-10-CM

## 2016-09-01 DIAGNOSIS — Z3A24 24 weeks gestation of pregnancy: Secondary | ICD-10-CM

## 2016-09-01 DIAGNOSIS — Z1389 Encounter for screening for other disorder: Secondary | ICD-10-CM

## 2016-09-01 DIAGNOSIS — O0990 Supervision of high risk pregnancy, unspecified, unspecified trimester: Secondary | ICD-10-CM

## 2016-09-01 DIAGNOSIS — O10919 Unspecified pre-existing hypertension complicating pregnancy, unspecified trimester: Secondary | ICD-10-CM

## 2016-09-01 DIAGNOSIS — Z331 Pregnant state, incidental: Secondary | ICD-10-CM

## 2016-09-01 DIAGNOSIS — O24410 Gestational diabetes mellitus in pregnancy, diet controlled: Secondary | ICD-10-CM

## 2016-09-01 LAB — POCT URINALYSIS DIPSTICK
Blood, UA: NEGATIVE
Glucose, UA: NEGATIVE
Ketones, UA: NEGATIVE
Leukocytes, UA: NEGATIVE
Nitrite, UA: NEGATIVE
Protein, UA: NEGATIVE

## 2016-09-01 NOTE — Progress Notes (Signed)
Fetal Surveillance Testing today:  FHR 165   High Risk Pregnancy Diagnosis(es):   Class A1/B DM, CHTN  [id number] [redacted]w[redacted]d Estimated Date of Delivery: 12/25/16  Blood pressure 122/60, pulse 89, weight 231 lb (104.8 kg), last menstrual period 03/20/2016.  Urinalysis: Negative   HPI: The patient is being seen today for ongoing management of as above. Today she reports CBG ok   BP weight and urine results all reviewed and noted. Patient reports good fetal movement, denies any bleeding and no rupture of membranes symptoms or regular contractions.  Fundal Height:  26 Fetal Heart rate:  165 Edema:  none  Patient is without complaints other than noted in her HPI. All questions were answered.  All lab and sonogram results have been reviewed. Comments:    Assessment:  1.  Pregnancy at [redacted]w[redacted]d,  Estimated Date of Delivery: 12/25/16 :                          2.  CHTN                        3.  Class A1/B DM  Medication(s) Plans:  Labetalol 200 BID  Treatment Plan:  Per protocl  Return in about 3 weeks (around 09/22/2016) for PN2, Growth sonogram, HROB. for appointment for high risk OB care  No orders of the defined types were placed in this encounter.  Orders Placed This Encounter  Procedures  . POCT urinalysis dipstick

## 2016-09-01 NOTE — Addendum Note (Signed)
Addended by: Florian Buff on: 09/01/2016 03:03 PM   Modules accepted: Orders

## 2016-09-12 ENCOUNTER — Other Ambulatory Visit: Payer: Self-pay | Admitting: Obstetrics and Gynecology

## 2016-09-12 ENCOUNTER — Encounter: Payer: Self-pay | Admitting: Obstetrics and Gynecology

## 2016-09-12 ENCOUNTER — Ambulatory Visit (INDEPENDENT_AMBULATORY_CARE_PROVIDER_SITE_OTHER): Payer: Managed Care, Other (non HMO) | Admitting: Obstetrics and Gynecology

## 2016-09-12 VITALS — BP 138/74 | HR 97 | Wt 232.6 lb

## 2016-09-12 DIAGNOSIS — Z331 Pregnant state, incidental: Secondary | ICD-10-CM

## 2016-09-12 DIAGNOSIS — O0992 Supervision of high risk pregnancy, unspecified, second trimester: Secondary | ICD-10-CM

## 2016-09-12 DIAGNOSIS — J209 Acute bronchitis, unspecified: Secondary | ICD-10-CM | POA: Insufficient documentation

## 2016-09-12 DIAGNOSIS — O0990 Supervision of high risk pregnancy, unspecified, unspecified trimester: Secondary | ICD-10-CM

## 2016-09-12 DIAGNOSIS — R7303 Prediabetes: Secondary | ICD-10-CM

## 2016-09-12 DIAGNOSIS — O24410 Gestational diabetes mellitus in pregnancy, diet controlled: Secondary | ICD-10-CM

## 2016-09-12 DIAGNOSIS — J40 Bronchitis, not specified as acute or chronic: Secondary | ICD-10-CM

## 2016-09-12 DIAGNOSIS — Z1389 Encounter for screening for other disorder: Secondary | ICD-10-CM

## 2016-09-12 DIAGNOSIS — I10 Essential (primary) hypertension: Secondary | ICD-10-CM

## 2016-09-12 DIAGNOSIS — O10912 Unspecified pre-existing hypertension complicating pregnancy, second trimester: Secondary | ICD-10-CM

## 2016-09-12 DIAGNOSIS — Z3A25 25 weeks gestation of pregnancy: Secondary | ICD-10-CM

## 2016-09-12 LAB — POCT URINALYSIS DIPSTICK
Blood, UA: NEGATIVE
Glucose, UA: NEGATIVE
Ketones, UA: NEGATIVE
Leukocytes, UA: NEGATIVE
Nitrite, UA: NEGATIVE
Protein, UA: NEGATIVE

## 2016-09-12 MED ORDER — HYDROCODONE-HOMATROPINE 5-1.5 MG/5ML PO SYRP: 5.0000 mL | ORAL_SOLUTION | Freq: Four times a day (QID) | ORAL | 0 refills | Status: DC | PRN

## 2016-09-12 MED ORDER — ALBUTEROL SULFATE HFA 108 (90 BASE) MCG/ACT IN AERS: 2.0000 | INHALATION_SPRAY | RESPIRATORY_TRACT | 3 refills | Status: DC | PRN

## 2016-09-12 MED ORDER — IPRATROPIUM-ALBUTEROL 0.5-2.5 (3) MG/3ML IN SOLN: 3.0000 mL | Freq: Four times a day (QID) | RESPIRATORY_TRACT | Status: DC

## 2016-09-12 NOTE — Progress Notes (Signed)
Patient ID: Tina Wall, female   DOB: [birth year], 36 y.o.   MRN: [id number]   High Risk Pregnancy HROB Diagnosis(es):   Class A1/B DM, CHTN  [id number] [redacted]w[redacted]d Estimated Date of Delivery: 12/25/16     HPI: The patient is being seen today for ongoing management of the above.  Chief Complaint  Patient presents with  . w/i    bronchitis not better  ____  Today she reports persistent cough, congestion and wheezing. Pt was dx with bronchitis/asthma and prescribed Zithromax, Tessalon and prednisone on 08/24/16. Pt states this treatment and her home inhaler are not providing relief of her symptoms.   Patient reports good fetal movement. She denies any bleeding and no rupture of membranes symptoms or regular contractions.  BP weight and urine results reviewed and noted. Blood pressure 138/74, pulse 97, weight 232 lb 9.6 oz (105.5 kg), last menstrual period 03/20/2016.  Fundal Height:  27 cm Fetal Heart rate:  145 bpm Physical Examination: Abdomen - soft, nontender, nondistended, no masses or organomegaly                                     Edema:  None    Heart: RRR    Lungs: diffuse inspiratory and expiratory wheezes in both upper lung fields, no rhonchi     Neck: No cervical adenopathy.   Urinalysis:NEGATIVE for all  Fetal Surveillance Testing today:  none  Lab and sonogram results have been reviewed.  Assessment:  1.  Pregnancy at [redacted]w[redacted]d,  [id number]   :  Estimated Date of Delivery: 12/25/16                         2.  Class A1/B DM                        3. CHTN   4. Asthma   5. Bronchitis    Medication(s) Plans:   1. Labetalol 200 BID 2. Refill albuterol inhaler and DuoNeb tx  3. Rx Mucinex DM and Hycodan   Treatment Plan:   1. Growth u/s @ 28, 34, 38wks      2. 2x/wk testing nst/sono @ 32wks     3. Deliver @  39wks (meds):Marland Kitchen  QID BS testing.  4. Recommended Neti Pot for nasal congestion (or saline nasal lavage)   Follow up in 2 weeks for appointment for high risk OB care   By  signing my name below, I, Hansel Feinstein, attest that this documentation has been prepared under the direction and in the presence of Jonnie Kind, MD. Electronically Signed: Hansel Feinstein, ED Scribe. 09/12/16. 3:33 PM.  I personally performed the services described in this documentation, which was SCRIBED in my presence. The recorded information has been reviewed and considered accurate. It has been edited as necessary during review. Jonnie Kind, MD

## 2016-09-13 ENCOUNTER — Telehealth: Payer: Self-pay | Admitting: *Deleted

## 2016-09-13 ENCOUNTER — Other Ambulatory Visit: Payer: Self-pay | Admitting: Obstetrics and Gynecology

## 2016-09-13 NOTE — Telephone Encounter (Signed)
Pt needs refill on Labetalol 200mg . It looks like you have it pended

## 2016-09-13 NOTE — Telephone Encounter (Signed)
For some reason epic isn't letting me esribe this med for this pt. I can print tomorrow and have it faxed

## 2016-09-14 ENCOUNTER — Other Ambulatory Visit: Payer: Self-pay | Admitting: Obstetrics and Gynecology

## 2016-09-14 MED ORDER — LABETALOL HCL 200 MG PO TABS: 200.0000 mg | ORAL_TABLET | Freq: Two times a day (BID) | ORAL | 3 refills | Status: DC

## 2016-09-14 NOTE — Telephone Encounter (Signed)
Informed patient prescription will be faxed today.

## 2016-09-16 ENCOUNTER — Encounter (HOSPITAL_COMMUNITY): Payer: Self-pay

## 2016-09-16 ENCOUNTER — Inpatient Hospital Stay (HOSPITAL_COMMUNITY): Payer: Managed Care, Other (non HMO)

## 2016-09-16 ENCOUNTER — Inpatient Hospital Stay (HOSPITAL_COMMUNITY)
Admission: AD | Admit: 2016-09-16 | Discharge: 2016-09-20 | DRG: 781 | Disposition: A | Discharge status: Home / Self Care | Payer: Managed Care, Other (non HMO) | Source: Ambulatory Visit | Attending: Obstetrics and Gynecology | Admitting: Obstetrics and Gynecology

## 2016-09-16 DIAGNOSIS — O0990 Supervision of high risk pregnancy, unspecified, unspecified trimester: Secondary | ICD-10-CM

## 2016-09-16 DIAGNOSIS — R0602 Shortness of breath: Secondary | ICD-10-CM | POA: Diagnosis not present

## 2016-09-16 DIAGNOSIS — Z3A25 25 weeks gestation of pregnancy: Secondary | ICD-10-CM

## 2016-09-16 DIAGNOSIS — O99512 Diseases of the respiratory system complicating pregnancy, second trimester: Secondary | ICD-10-CM | POA: Diagnosis present

## 2016-09-16 DIAGNOSIS — O10012 Pre-existing essential hypertension complicating pregnancy, second trimester: Secondary | ICD-10-CM | POA: Diagnosis present

## 2016-09-16 DIAGNOSIS — J4541 Moderate persistent asthma with (acute) exacerbation: Secondary | ICD-10-CM

## 2016-09-16 DIAGNOSIS — Z363 Encounter for antenatal screening for malformations: Secondary | ICD-10-CM

## 2016-09-16 DIAGNOSIS — O10919 Unspecified pre-existing hypertension complicating pregnancy, unspecified trimester: Secondary | ICD-10-CM

## 2016-09-16 DIAGNOSIS — J45909 Unspecified asthma, uncomplicated: Secondary | ICD-10-CM | POA: Diagnosis not present

## 2016-09-16 DIAGNOSIS — O24410 Gestational diabetes mellitus in pregnancy, diet controlled: Secondary | ICD-10-CM | POA: Diagnosis present

## 2016-09-16 DIAGNOSIS — O99519 Diseases of the respiratory system complicating pregnancy, unspecified trimester: Secondary | ICD-10-CM | POA: Diagnosis not present

## 2016-09-16 DIAGNOSIS — O34211 Maternal care for low transverse scar from previous cesarean delivery: Secondary | ICD-10-CM | POA: Diagnosis present

## 2016-09-16 DIAGNOSIS — O9989 Other specified diseases and conditions complicating pregnancy, childbirth and the puerperium: Secondary | ICD-10-CM | POA: Diagnosis not present

## 2016-09-16 DIAGNOSIS — J45901 Unspecified asthma with (acute) exacerbation: Secondary | ICD-10-CM | POA: Diagnosis present

## 2016-09-16 LAB — URINALYSIS, ROUTINE W REFLEX MICROSCOPIC
Bilirubin Urine: NEGATIVE
Glucose, UA: NEGATIVE mg/dL
Hgb urine dipstick: NEGATIVE
Ketones, ur: NEGATIVE mg/dL
Leukocytes, UA: NEGATIVE
Nitrite: NEGATIVE
Protein, ur: NEGATIVE mg/dL
Specific Gravity, Urine: 1.008 (ref 1.005–1.030)
pH: 6 (ref 5.0–8.0)

## 2016-09-16 LAB — COMPREHENSIVE METABOLIC PANEL
ALT: 29 U/L (ref 14–54)
AST: 39 U/L (ref 15–41)
Albumin: 3.3 g/dL — ABNORMAL LOW (ref 3.5–5.0)
Alkaline Phosphatase: 42 U/L (ref 38–126)
Anion gap: 8 (ref 5–15)
BUN: 6 mg/dL (ref 6–20)
CO2: 21 mmol/L — ABNORMAL LOW (ref 22–32)
Calcium: 8.4 mg/dL — ABNORMAL LOW (ref 8.9–10.3)
Chloride: 105 mmol/L (ref 101–111)
Creatinine, Ser: 0.52 mg/dL (ref 0.44–1.00)
GFR calc Af Amer: >60 mL/min (ref >60)
GFR calc non Af Amer: >60 mL/min (ref >60)
Glucose, Bld: 97 mg/dL (ref 65–99)
Potassium: 3.3 mmol/L — ABNORMAL LOW (ref 3.5–5.1)
Sodium: 134 mmol/L — ABNORMAL LOW (ref 135–145)
Total Bilirubin: 0.6 mg/dL (ref 0.3–1.2)
Total Protein: 7.2 g/dL (ref 6.5–8.1)

## 2016-09-16 LAB — GLUCOSE, CAPILLARY
Glucose-Capillary: 77 mg/dL (ref 65–99)
Glucose-Capillary: 86 mg/dL (ref 65–99)
Glucose-Capillary: 158 mg/dL — ABNORMAL HIGH (ref 65–99)
Glucose-Capillary: 179 mg/dL — ABNORMAL HIGH (ref 65–99)

## 2016-09-16 LAB — CBC
HCT: 31.2 % — ABNORMAL LOW (ref 36.0–46.0)
Hemoglobin: 10.6 g/dL — ABNORMAL LOW (ref 12.0–15.0)
MCH: 31.2 pg (ref 26.0–34.0)
MCHC: 34 g/dL (ref 30.0–36.0)
MCV: 91.8 fL (ref 78.0–100.0)
Platelets: 212 10*3/uL (ref 150–400)
RBC: 3.4 MIL/uL — ABNORMAL LOW (ref 3.87–5.11)
RDW: 14.2 % (ref 11.5–15.5)
WBC: 5.7 10*3/uL (ref 4.0–10.5)

## 2016-09-16 LAB — INFLUENZA PANEL BY PCR (TYPE A & B)
Influenza A By PCR: NEGATIVE
Influenza B By PCR: NEGATIVE

## 2016-09-16 MED ORDER — METHYLPREDNISOLONE SODIUM SUCC 125 MG IJ SOLR
60.0000 mg | Freq: Two times a day (BID) | INTRAMUSCULAR | Status: DC
  Administered 2016-09-16 – 2016-09-17 (×2): 60 mg via INTRAVENOUS
  Filled 2016-09-16 (×3): qty 0.96

## 2016-09-16 MED ORDER — BENZONATATE 100 MG PO CAPS
200.0000 mg | ORAL_CAPSULE | Freq: Three times a day (TID) | ORAL | Status: DC
  Administered 2016-09-16 – 2016-09-20 (×13): 200 mg via ORAL
  Filled 2016-09-16 (×16): qty 2

## 2016-09-16 MED ORDER — ZOLPIDEM TARTRATE 5 MG PO TABS: 5.0000 mg | ORAL_TABLET | Freq: Every evening | ORAL | Status: DC | PRN

## 2016-09-16 MED ORDER — HYDROCOD POLST-CPM POLST ER 10-8 MG/5ML PO SUER
5.0000 mL | Freq: Two times a day (BID) | ORAL | Status: DC | PRN
  Administered 2016-09-16 – 2016-09-20 (×7): 5 mL via ORAL
  Filled 2016-09-16 (×7): qty 5

## 2016-09-16 MED ORDER — IPRATROPIUM-ALBUTEROL 0.5-2.5 (3) MG/3ML IN SOLN
3.0000 mL | Freq: Four times a day (QID) | RESPIRATORY_TRACT | Status: DC
  Administered 2016-09-16: 3 mL via RESPIRATORY_TRACT
  Filled 2016-09-16 (×3): qty 3

## 2016-09-16 MED ORDER — ACETAMINOPHEN 325 MG PO TABS
650.0000 mg | ORAL_TABLET | ORAL | Status: DC | PRN
Start: 2016-09-16 — End: 2016-09-20
  Administered 2016-09-16: 650 mg via ORAL
  Filled 2016-09-16: qty 2

## 2016-09-16 MED ORDER — DEXTROSE IN LACTATED RINGERS 5 % IV SOLN
INTRAVENOUS | Status: DC
  Administered 2016-09-16 – 2016-09-17 (×2): via INTRAVENOUS

## 2016-09-16 MED ORDER — IPRATROPIUM-ALBUTEROL 0.5-2.5 (3) MG/3ML IN SOLN: 3.0000 mL | RESPIRATORY_TRACT | Status: DC

## 2016-09-16 MED ORDER — PRENATAL MULTIVITAMIN CH: 1.0000 | ORAL_TABLET | Freq: Every day | ORAL | Status: DC

## 2016-09-16 MED ORDER — PRENATAL PLUS 27-1 MG PO TABS
1.0000 | ORAL_TABLET | Freq: Every day | ORAL | Status: DC
  Administered 2016-09-16 – 2016-09-20 (×5): 1 via ORAL
  Filled 2016-09-16 (×5): qty 1

## 2016-09-16 MED ORDER — IPRATROPIUM-ALBUTEROL 0.5-2.5 (3) MG/3ML IN SOLN
3.0000 mL | RESPIRATORY_TRACT | Status: DC
  Administered 2016-09-16 – 2016-09-20 (×27): 3 mL via RESPIRATORY_TRACT
  Filled 2016-09-16 (×33): qty 3

## 2016-09-16 MED ORDER — ASPIRIN 81 MG PO CHEW
81.0000 mg | CHEWABLE_TABLET | Freq: Every day | ORAL | Status: DC
  Administered 2016-09-16 – 2016-09-20 (×5): 81 mg via ORAL
  Filled 2016-09-16 (×6): qty 1

## 2016-09-16 MED ORDER — LABETALOL HCL 100 MG PO TABS
200.0000 mg | ORAL_TABLET | Freq: Once | ORAL | Status: AC
  Administered 2016-09-16: 200 mg via ORAL
  Filled 2016-09-16: qty 2

## 2016-09-16 MED ORDER — LABETALOL HCL 100 MG PO TABS
200.0000 mg | ORAL_TABLET | Freq: Two times a day (BID) | ORAL | Status: DC
  Administered 2016-09-16: 200 mg via ORAL
  Filled 2016-09-16: qty 2

## 2016-09-16 MED ORDER — DOCUSATE SODIUM 100 MG PO CAPS
100.0000 mg | ORAL_CAPSULE | Freq: Every day | ORAL | Status: DC
  Administered 2016-09-16 – 2016-09-20 (×4): 100 mg via ORAL
  Filled 2016-09-16 (×4): qty 1

## 2016-09-16 MED ORDER — CALCIUM CARBONATE ANTACID 500 MG PO CHEW: 2.0000 | CHEWABLE_TABLET | ORAL | Status: DC | PRN

## 2016-09-16 MED ORDER — NIFEDIPINE ER OSMOTIC RELEASE 30 MG PO TB24
30.0000 mg | ORAL_TABLET | Freq: Every day | ORAL | Status: DC
  Administered 2016-09-17 – 2016-09-20 (×4): 30 mg via ORAL
  Filled 2016-09-16 (×4): qty 1

## 2016-09-16 NOTE — H&P (Signed)
Tina Wall is a 89 y.[id number] IUP 25 5/7 weeks presents to MAU with c/o SOB. Pt has had problems with SOB, wheezing and cough since Dx of bronchitis first of Jan. At that time treated with Azithromycin, Proair and steroids. Sx improved somewhat in that her productive cough resolved. She was admitted for observation on 1/9 for diarrhea and hyponatremia which resolved. She reports continued problems with wheezing despite using her inhaler and nebulizer. She denies any fever, chills, body aches or recent exposures.  W/U in MAU demonstrates a normal CXR and WBC. Wheezing improved after duoneb treatment but wheezing still present. O2 sat 97% on RA.  Dx with asthma @ 62. Has been controlled with PRN Proair . Occasional flare ups with bronchitis would require antibiotics and oral steroids. Has never been intubated or used any inhale steroids.  Pregnancy has been complicated bu CHTN and Dm. Htn controlled with Labetalol and BS have been under control with diet only. She also has a H/O c section x 2. OB History    Gravida Para Term Preterm AB Living   4 3 3     3    SAB TAB Ectopic Multiple Live Births           3     Past Medical History:  Diagnosis Date  . Asthma   . Bronchitis   . Diabetes mellitus without complication ([place])   . GI bleeding   . HSV-2 (herpes simplex virus 2) infection   . Hx of chlamydia infection   . Hx of trichomoniasis   . Hypertension    Past Surgical History:  Procedure Laterality Date  . CESAREAN SECTION    . FOOT SURGERY    . TONSILLECTOMY     Family History: family history includes Alzheimer's disease in her paternal grandmother; Cancer in her father; Diabetes in her mother; Hypertension in her mother; Kidney disease in her mother. Social History:  reports that she has never smoked. She has never used smokeless tobacco. She reports that she does not drink alcohol or use drugs.      Review of Systems  Respiratory: Positive for cough, shortness of breath  and wheezing.    History   Blood pressure 142/83, pulse 82, temperature 98.1 F (36.7 C), temperature source Oral, resp. rate 20, height 5\' 1"  (1.549 m), weight 231 lb (104.8 kg), last menstrual period 03/20/2016, SpO2 97 %. Exam Physical Exam  Constitutional:  In NAD but somewhat ill appearing, able to talk without SOB  Eyes: Pupils are equal, round, and reactive to light.  Neck: Normal range of motion. Neck supple.  Cardiovascular: Normal rate and regular rhythm.   Respiratory: She has wheezes.  GI: Soft.    Prenatal labs: ABO, Rh: O/Positive/-- (08/30 1654) Antibody: Negative (08/30 1654) Rubella: 2.43 (08/30 1654) RPR: Reactive (08/30 1654)  HBsAg: Negative (08/30 1654)  HIV: Non Reactive (08/30 1654)  GBS:     Assessment/Plan: IUP 25 5/7 weeks Asthma with acute vs chronic extubation CHTN Class A1/B DM Prior c section x 2    Admit pt to try to gain better control of her asthma. Will hold on IV steroids at this time d/t to Dm. Continue with Deuonebs. Continue with Labetalol. Follow Bs. Consult Pulmonary for management of her asthma. POC reviewed with pt and FOB. Verbalized understanding of POC  Chancy Milroy 09/16/2016, 3:21 AM

## 2016-09-16 NOTE — MAU Provider Note (Signed)
History     CSN: [id number]  Arrival date and time: 09/16/16 0107   First Provider Initiated Contact with Patient 09/16/16 0129      Chief Complaint  Patient presents with  . Wheezing  . Shortness of Breath   Tina Wall is a 36 y.o. [phone] [redacted]w[redacted]d who presents today with wheezing. She states that she was seen here, and DC home on January 9th. She was given oral steroids, nebulizer to use at home, but none of this is helping. She also reports CHTN, and she has not taken any of her blood pressure medication since. 09/12/16 because she did not have a refill. She states that she got a message today that her prescription is ready, but she did not pick it up at this time.    Wheezing   This is a new problem. The current episode started 1 to 4 weeks ago. The problem occurs intermittently. The problem has been unchanged. Associated symptoms include coughing and shortness of breath. Pertinent negatives include no chest pain, chills or fever. The symptoms are aggravated by emotional upset. She has tried beta agonist inhalers, oral steroids and OTC cough suppressant (antibiotics ) for the symptoms. The treatment provided no relief.    Past Medical History:  Diagnosis Date  . Asthma   . Bronchitis   . Diabetes mellitus without complication ([place])   . GI bleeding   . HSV-2 (herpes simplex virus 2) infection   . Hx of chlamydia infection   . Hx of trichomoniasis   . Hypertension     Past Surgical History:  Procedure Laterality Date  . CESAREAN SECTION    . FOOT SURGERY    . TONSILLECTOMY      Family History  Problem Relation Age of Onset  . Hypertension Mother   . Diabetes Mother   . Kidney disease Mother   . Cancer Father     trachea   . Alzheimer's disease Paternal Grandmother      Social History  Substance Use Topics  . Smoking status: Never Smoker  . Smokeless tobacco: Never Used  . Alcohol use No     Comment: occasionally    Allergies:  Allergies  Allergen  Reactions  . Ace Inhibitors Shortness Of Breath    Short of breath  . Latex Other (See Comments)    PT IS UNSURE OF WHETHER OR NOT THIS IS AN ALLERGY    Facility-Administered Medications Prior to Admission  Medication Dose Route Frequency Provider Last Rate Last Dose  . ipratropium-albuterol (DUONEB) 0.5-2.5 (3) MG/3ML nebulizer solution 3 mL  3 mL Nebulization Q6H Jonnie Kind, MD       Prescriptions Prior to Admission  Medication Sig Dispense Refill Last Dose  . albuterol (PROAIR HFA) 108 (90 Base) MCG/ACT inhaler Inhale 2 puffs into the lungs every 4 (four) hours as needed for wheezing or shortness of breath. Reported on 10/08/2015 1 Inhaler 3   . aspirin (ASPIRIN CHILDRENS) 81 MG chewable tablet Chew 1 tablet (81 mg total) by mouth daily. 30 tablet 6 Taking  . HYDROcodone-homatropine (HYCODAN) 5-1.5 MG/5ML syrup Take 5 mLs by mouth every 6 (six) hours as needed for cough. 120 mL 0   . labetalol (NORMODYNE) 200 MG tablet Take 1 tablet (200 mg total) by mouth 2 (two) times daily. 60 tablet 3   . Prenatal Vit-Fe Fumarate-FA (PRENATAL COMPLETE PO) Take by mouth.   Taking    Review of Systems  Constitutional: Negative for chills and fever.  Respiratory: Positive for cough, shortness of breath and wheezing.   Cardiovascular: Negative for chest pain.   Physical Exam   Blood pressure 146/89, pulse 99, temperature 98.1 F (36.7 C), temperature source Oral, resp. rate 20, height 5\' 1"  (1.549 m), weight 104.8 kg (231 lb), last menstrual period 03/20/2016, SpO2 97 %.  Physical Exam  Nursing note and vitals reviewed. Constitutional: She is oriented to person, place, and time. She appears well-developed and well-nourished. No distress.  HENT:  Head: Normocephalic.  Cardiovascular: Normal rate.   Respiratory: Effort normal. No respiratory distress. She has wheezes. She has no rales. She exhibits no tenderness.  GI: Soft. There is no tenderness. There is no rebound.  Neurological: She is  alert and oriented to person, place, and time.  Skin: Skin is warm and dry.  Psychiatric: She has a normal mood and affect.   Results for orders placed or performed during the hospital encounter of 09/16/16 (from the past 24 hour(s))  Urinalysis, Routine w reflex microscopic     Status: Abnormal   Collection Time: 09/16/16  1:17 AM  Result Value Ref Range   Color, Urine STRAW (A) YELLOW   APPearance CLEAR CLEAR   Specific Gravity, Urine 1.008 1.005 - 1.030   pH 6.0 5.0 - 8.0   Glucose, UA NEGATIVE NEGATIVE mg/dL   Hgb urine dipstick NEGATIVE NEGATIVE   Bilirubin Urine NEGATIVE NEGATIVE   Ketones, ur NEGATIVE NEGATIVE mg/dL   Protein, ur NEGATIVE NEGATIVE mg/dL   Nitrite NEGATIVE NEGATIVE   Leukocytes, UA NEGATIVE NEGATIVE  CBC     Status: Abnormal   Collection Time: 09/16/16  1:37 AM  Result Value Ref Range   WBC 5.7 4.0 - 10.5 K/uL   RBC 3.40 (L) 3.87 - 5.11 MIL/uL   Hemoglobin 10.6 (L) 12.0 - 15.0 g/dL   HCT 31.2 (L) 36.0 - 46.0 %   MCV 91.8 78.0 - 100.0 fL   MCH 31.2 26.0 - 34.0 pg   MCHC 34.0 30.0 - 36.0 g/dL   RDW 14.2 11.5 - 15.5 %   Platelets 212 150 - 400 K/uL  Comprehensive metabolic panel     Status: Abnormal   Collection Time: 09/16/16  1:37 AM  Result Value Ref Range   Sodium 134 (L) 135 - 145 mmol/L   Potassium 3.3 (L) 3.5 - 5.1 mmol/L   Chloride 105 101 - 111 mmol/L   CO2 21 (L) 22 - 32 mmol/L   Glucose, Bld 97 65 - 99 mg/dL   BUN 6 6 - 20 mg/dL   Creatinine, Ser 0.52 0.44 - 1.00 mg/dL   Calcium 8.4 (L) 8.9 - 10.3 mg/dL   Total Protein 7.2 6.5 - 8.1 g/dL   Albumin 3.3 (L) 3.5 - 5.0 g/dL   AST 39 15 - 41 U/L   ALT 29 14 - 54 U/L   Alkaline Phosphatase 42 38 - 126 U/L   Total Bilirubin 0.6 0.3 - 1.2 mg/dL   GFR calc non Af Amer >60 >60 mL/min   GFR calc Af Amer >60 >60 mL/min   Anion gap 8 5 - 15   Dg Chest 2 View  Result Date: 09/16/2016 CLINICAL DATA:  36 year old female, 25 rib fragment presenting with cough and dyspnea. EXAM: CHEST  2 VIEW  COMPARISON:  Chest radiograph dated 09/24/2012 FINDINGS: There is mild cardiomegaly, relatively similar to prior radiograph accounting for difference in the degree of inspiration. No vascular congestion or edema. There is no focal consolidation, pleural effusion, or pneumothorax.  No acute osseous pathology. IMPRESSION: Mild cardiomegaly.  No vascular congestion or edema. Electronically Signed   By: Anner Crete M.D.   On: 09/16/2016 02:25    MAU Course  Procedures  MDM 0239: D/W Dr. Rip Harbour, he will come see the patient.  0246: Dr. Rip Harbour on the unit to see the patient.   Assessment and Plan  Acute asthma exacerbation Admit to ante   Mathis Bud 09/16/2016, 1:31 AM

## 2016-09-16 NOTE — Progress Notes (Signed)
Pt returned from [place]. Placed by on EFM. Pt's extreme coughing is making it difficult to keep continuous fetal  Heart tracing. FHR 150. Toya Smothers, RN

## 2016-09-16 NOTE — Progress Notes (Signed)
Initial Nutrition Assessment  DOCUMENTATION CODES:  Morbid obesity  INTERVENTION:  Carbohydrate modified gestational diabetic diet  NUTRITION DIAGNOSIS:  Increased nutrient needs related to  (pregnancy and fetal growth requirements) as evidenced by  (25 weeks IUP).  GOAL:  Patient will meet greater than or equal to 90% of their needs  MONITOR:  Weight trends  REASON FOR ASSESSMENT:  Antenatal, Gestational Diabetes   ASSESSMENT:   25 5/7 weeks IUP, adm with SOB/asthma, CHTN, GDM. weight at initial pre-natal visit 225 Lbs, BMI 42.5. 6 lb weight gain  Diet Order:  Diet gestational carb mod Room service appropriate? Yes; Fluid consistency: Thin  Height:   Ht Readings from Last 1 Encounters:  09/16/16 5\' 1"  (1.549 m)   Weight:   Wt Readings from Last 1 Encounters:  09/16/16 231 lb (104.8 kg)    Ideal Body Weight:   105 lbs  BMI:  Body mass index is 43.65 kg/m.  Estimated Nutritional Needs:   Kcal:  2200-2400  Protein:  100-110 g  Fluid:  2.5 L  EDUCATION NEEDS:   No education needs identified at this time Diet education documented on 06/27/16  Weyman Rodney M.Fredderick Severance LDN Neonatal Nutrition Support Specialist/RD III Pager [phone]      Phone [phone]

## 2016-09-16 NOTE — MAU Note (Signed)
Pt states she was here 3 weeks ago and was given meds (nebulizer) but the meds are not helping and she is wheezing and coughing.

## 2016-09-17 LAB — GLUCOSE, CAPILLARY
Glucose-Capillary: 158 mg/dL — ABNORMAL HIGH (ref 65–99)
Glucose-Capillary: 159 mg/dL — ABNORMAL HIGH (ref 65–99)
Glucose-Capillary: 123 mg/dL — ABNORMAL HIGH (ref 65–99)
Glucose-Capillary: 175 mg/dL — ABNORMAL HIGH (ref 65–99)

## 2016-09-17 MED ORDER — INSULIN ASPART 100 UNIT/ML ~~LOC~~ SOLN
0.0000 [IU] | Freq: Three times a day (TID) | SUBCUTANEOUS | Status: DC
  Administered 2016-09-17 (×2): 2 [IU] via SUBCUTANEOUS
  Administered 2016-09-17: 3 [IU] via SUBCUTANEOUS
  Administered 2016-09-18: 1 [IU] via SUBCUTANEOUS
  Administered 2016-09-18: 2 [IU] via SUBCUTANEOUS
  Administered 2016-09-18: 1 [IU] via SUBCUTANEOUS

## 2016-09-17 MED ORDER — SODIUM CHLORIDE 0.9% FLUSH
3.0000 mL | Freq: Two times a day (BID) | INTRAVENOUS | Status: DC
  Administered 2016-09-17 – 2016-09-20 (×7): 3 mL via INTRAVENOUS

## 2016-09-17 MED ORDER — PREDNISONE 50 MG PO TABS
50.0000 mg | ORAL_TABLET | Freq: Two times a day (BID) | ORAL | Status: DC
  Administered 2016-09-17 – 2016-09-20 (×6): 50 mg via ORAL
  Filled 2016-09-17 (×8): qty 1

## 2016-09-17 MED ORDER — DM-GUAIFENESIN ER 30-600 MG PO TB12
1.0000 | ORAL_TABLET | Freq: Two times a day (BID) | ORAL | Status: DC
  Administered 2016-09-17 – 2016-09-20 (×7): 1 via ORAL
  Filled 2016-09-17 (×10): qty 1

## 2016-09-17 MED ORDER — SODIUM CHLORIDE 0.9% FLUSH: 3.0000 mL | INTRAVENOUS | Status: DC | PRN

## 2016-09-17 NOTE — Progress Notes (Signed)
Inpatient Diabetes Program Recommendations  Diabetes Treatment Program Recommendations  ADA Standards of Care 2017 Diabetes in Pregnancy Target Glucose Ranges:  Fasting: 60 - 90 mg/dL Preprandial: 60 - 105 mg/dL 1 hr postprandial: Less than 140mg /dL (from first bite of meal) 2 hr postprandial: Less than 120 mg/dL (from first bit of meal)    Lab Results  Component Value Date   GLUCAP 158 (H) 09/17/2016   HGBA1C 5.7 (H) 05/30/2016    Review of Glycemic Control Results for CHARLOTTE, MARCHEWKA (MRN [id number]) as of 09/17/2016 10:48  Ref. Range 09/16/2016 07:43 09/16/2016 12:52 09/16/2016 18:11 09/16/2016 21:13 09/17/2016 07:03  Glucose-Capillary Latest Ref Range: 65 - 99 mg/dL 77 86 158 (H) 179 (H) 158 (H)    Inpatient Diabetes Program Recommendations:  Noted patient on steroids. Please consider Diabetic pregnant patient order set including fasting and postprandial CBGs with least amount correction selected.  Thank you, Nani Gasser. Kaveon Blatz, RN, MSN, CDE Inpatient Glycemic Control Team Team Pager [phone] (8am-5pm) 09/17/2016 10:50 AM

## 2016-09-17 NOTE — Progress Notes (Signed)
[place] NOTE  Tina Wall is a 36 y.o. [id number] at 12w6dwho is admitted for asthma exacerbation.  Estimated Date of Delivery: 12/25/16. Fetal presentation is unsure.  Length of Stay:  1 Days. Admitted 09/16/2016  Subjective: Improved respiratory stautus since IV steroids.  Patient reports good fetal movement.  She reports no uterine contractions, no bleeding and no loss of fluid per vagina.  Vitals:  Blood pressure 135/70, pulse 93, temperature 98.5 F (36.9 C), temperature source Oral, resp. rate 20, height '5\' 1"'  (1.549 m), weight 231 lb (104.8 kg), last menstrual period 03/20/2016, SpO2 95 %. Physical Examination: CONSTITUTIONAL: Well-developed, well-nourished female in no acute distress.  HENT:  Normocephalic, atraumatic, External right and left ear normal. Oropharynx is clear and moist EYES: Conjunctivae and EOM are normal. Pupils are equal, round, and reactive to light. No scleral icterus.  NECK: Normal range of motion, supple, no masses SKIN: Skin is warm and dry. No rash noted. Not diaphoretic. No erythema. No pallor. [place] and oriented to person, place, and time. Normal reflexes, muscle tone coordination. No cranial nerve deficit noted. PSYCHIATRIC: Normal mood and affect. Normal behavior. Normal judgment and thought content. CARDIOVASCULAR: Normal heart rate noted, regular rhythm RESPIRATORY: Expiratory wheezes noted. Clear lungs.  MUSCULOSKELETAL: Normal range of motion. No edema and no tenderness. 2+ distal pulses. ABDOMEN: Soft, nontender, nondistended, gravid. CERVIX:  Deferred  Fetal monitoring: FHR: 140 bpm, Variability: moderate, Accelerations: Present, Decelerations: Absent  Uterine activity: No contractions  Results for orders placed or performed during the hospital encounter of 09/16/16 (from the past 48 hour(s))  Urinalysis, Routine w reflex microscopic     Status: Abnormal   Collection Time: 09/16/16  1:17 AM   Result Value Ref Range   Color, Urine STRAW (A) YELLOW   APPearance CLEAR CLEAR   Specific Gravity, Urine 1.008 1.005 - 1.030   pH 6.0 5.0 - 8.0   Glucose, UA NEGATIVE NEGATIVE mg/dL   Hgb urine dipstick NEGATIVE NEGATIVE   Bilirubin Urine NEGATIVE NEGATIVE   Ketones, ur NEGATIVE NEGATIVE mg/dL   Protein, ur NEGATIVE NEGATIVE mg/dL   Nitrite NEGATIVE NEGATIVE   Leukocytes, UA NEGATIVE NEGATIVE  CBC     Status: Abnormal   Collection Time: 09/16/16  1:37 AM  Result Value Ref Range   WBC 5.7 4.0 - 10.5 K/uL   RBC 3.40 (L) 3.87 - 5.11 MIL/uL   Hemoglobin 10.6 (L) 12.0 - 15.0 g/dL   HCT 31.2 (L) 36.0 - 46.0 %   MCV 91.8 78.0 - 100.0 fL   MCH 31.2 26.0 - 34.0 pg   MCHC 34.0 30.0 - 36.0 g/dL   RDW 14.2 11.5 - 15.5 %   Platelets 212 150 - 400 K/uL  Comprehensive metabolic panel     Status: Abnormal   Collection Time: 09/16/16  1:37 AM  Result Value Ref Range   Sodium 134 (L) 135 - 145 mmol/L   Potassium 3.3 (L) 3.5 - 5.1 mmol/L   Chloride 105 101 - 111 mmol/L   CO2 21 (L) 22 - 32 mmol/L   Glucose, Bld 97 65 - 99 mg/dL   BUN 6 6 - 20 mg/dL   Creatinine, Ser 0.52 0.44 - 1.00 mg/dL   Calcium 8.4 (L) 8.9 - 10.3 mg/dL   Total Protein 7.2 6.5 - 8.1 g/dL   Albumin 3.3 (L) 3.5 - 5.0 g/dL   AST 39 15 - 41 U/L   ALT 29 14 - 54 U/L   Alkaline  Phosphatase 42 38 - 126 U/L   Total Bilirubin 0.6 0.3 - 1.2 mg/dL   GFR calc non Af Amer >60 >60 mL/min   GFR calc Af Amer >60 >60 mL/min    Comment: (NOTE) The eGFR has been calculated using the CKD EPI equation. This calculation has not been validated in all clinical situations. eGFR's persistently <60 mL/min signify possible Chronic Kidney Disease.    Anion gap 8 5 - 15  Influenza panel by PCR (type A & B)     Status: None   Collection Time: 09/16/16  3:07 AM  Result Value Ref Range   Influenza A By PCR NEGATIVE NEGATIVE   Influenza B By PCR NEGATIVE NEGATIVE    Comment: (NOTE) The Xpert Xpress Flu assay is intended as an aid in the  diagnosis of  influenza and should not be used as a sole basis for treatment.  This  assay is FDA approved for nasopharyngeal swab specimens only. Nasal  washings and aspirates are unacceptable for Xpert Xpress Flu testing.   Glucose, capillary     Status: None   Collection Time: 09/16/16  7:43 AM  Result Value Ref Range   Glucose-Capillary 77 65 - 99 mg/dL  Glucose, capillary     Status: None   Collection Time: 09/16/16 12:52 PM  Result Value Ref Range   Glucose-Capillary 86 65 - 99 mg/dL   Comment 1 Notify RN   Glucose, capillary     Status: Abnormal   Collection Time: 09/16/16  6:11 PM  Result Value Ref Range   Glucose-Capillary 158 (H) 65 - 99 mg/dL  Glucose, capillary     Status: Abnormal   Collection Time: 09/16/16  9:13 PM  Result Value Ref Range   Glucose-Capillary 179 (H) 65 - 99 mg/dL  Glucose, capillary     Status: Abnormal   Collection Time: 09/17/16  7:03 AM  Result Value Ref Range   Glucose-Capillary 158 (H) 65 - 99 mg/dL    Dg Chest 2 View  Addendum Date: 09/16/2016   ADDENDUM REPORT: 09/16/2016 04:08 ADDENDUM: Please note there is a typographical error in the clinical data section of the report. The correct history should read: 36 year old female, [redacted] week pregnant presenting with cough and dyspnea. Electronically Signed   By: Anner Crete M.D.   On: 09/16/2016 04:08   Result Date: 09/16/2016 CLINICAL DATA:  36 year old female, 25 rib fragment presenting with cough and dyspnea. EXAM: CHEST  2 VIEW COMPARISON:  Chest radiograph dated 09/24/2012 FINDINGS: There is mild cardiomegaly, relatively similar to prior radiograph accounting for difference in the degree of inspiration. No vascular congestion or edema. There is no focal consolidation, pleural effusion, or pneumothorax. No acute osseous pathology. IMPRESSION: Mild cardiomegaly.  No vascular congestion or edema. Electronically Signed: By: Anner Crete M.D. On: 09/16/2016 02:25   [place] Mfm Ob  Transvaginal  Result Date: 09/16/2016 ----------------------------------------------------------------------  OBSTETRICS REPORT                      (Signed Final 09/16/2016 10:26 am) ---------------------------------------------------------------------- Patient Info  ID #:       [id number]                         D.O.B.:   09/28/1980 (35 yrs)  Name:       Tina Wall                Visit Date:  09/16/2016 08:58 am ----------------------------------------------------------------------  Performed By  Performed By:     Raquel James         Ref. Address:     [address]                                                             [place], [place]  Attending:        Griffin Dakin MD         Location:         [facility]  Referred By:      Chancy Milroy                    MD ---------------------------------------------------------------------- Orders   #  Description                                 Code   1  [place] MFM OB COMP + 14 WK                      76805.01   2  [place] MFM OB TRANSVAGINAL                      99357.0  ----------------------------------------------------------------------   #  Ordered By               Order #        Accession #    Episode #   1  Arlina Robes            [id number]      [id number]     [id number]   2  MICHAEL ERVIN            [id number]      [id number]     [id number]  ---------------------------------------------------------------------- Indications   [redacted] weeks gestation of pregnancy                Z3A.25   Encounter for antenatal screening for          Z36.3   malformations   Asthma                                         O99.89 j45.909   Diabetes - Gestational  O24.419   Hypertension - Chronic/Pre-existing            O10.019   History of cesarean delivery x2 currently      O34.219   pregnant   Obesity  complicating pregnancy, second         O99.212   trimester  ---------------------------------------------------------------------- OB History  Gravidity:    4         Term:   3        Prem:   0        SAB:   0  TOP:          0       Ectopic:  0        Living: 0 ---------------------------------------------------------------------- Fetal Evaluation  Num Of Fetuses:     1  Fetal Heart         159  Rate(bpm):  Cardiac Activity:   Observed  Presentation:       Cephalic  Placenta:           Posterior Previa  Amniotic Fluid  AFI FV:      Subjectively within normal limits                              Largest Pocket(cm)                              4.45 ---------------------------------------------------------------------- Biometry  BPD:      66.6  mm     G. Age:  26w 6d         78  %    CI:        80.84   %   70 - 86                                                          FL/HC:      20.9   %   18.6 - 20.4  HC:      233.9  mm     G. Age:  25w 3d         19  %    HC/AC:      1.00       1.04 - 1.22  AC:      234.3  mm     G. Age:  27w 5d         92  %    FL/BPD:     73.6   %   71 - 87  FL:         49  mm     G. Age:  26w 4d         60  %    FL/AC:      20.9   %   20 - 24  CER:        32  mm     G. Age:  28w 0d         90  %  CM:        5.3  mm  Est. FW:    1017  gm      2 lb 4 oz     77  % ----------------------------------------------------------------------  Gestational Age  LMP:           25w 5d       Date:   03/20/16                 EDD:   12/25/16  U/S Today:     26w 5d                                        EDD:   12/18/16  Best:          25w 5d    Det. By:   LMP  (03/20/16)          EDD:   12/25/16 ---------------------------------------------------------------------- Anatomy  Cranium:               Appears normal         Aortic Arch:            Appears normal  Cavum:                 Appears normal         Ductal Arch:            Not well visualized  Ventricles:            Appears normal         Diaphragm:               Appears normal  Choroid Plexus:        Not well visualized    Stomach:                Appears normal, left                                                                        sided  Cerebellum:            Appears normal         Abdomen:                Appears normal  Posterior Fossa:       Appears normal         Abdominal Wall:         Not well visualized  Nuchal Fold:           Not applicable (>24    Cord Vessels:           Appears normal ([redacted]                         wks GA)                                        vessel cord)  Face:                  Not well visualized    Kidneys:                Appear normal  Lips:  Not well visualized    Bladder:                Appears normal  Thoracic:              Appears normal         Spine:                  Not well visualized  Heart:                 Appears normal         Upper Extremities:      Appears normal                         (4CH, axis, and situs  RVOT:                  Appears normal         Lower Extremities:      Appears normal  LVOT:                  Appears normal  Other:  Female gender. Technically difficult due to maternal habitus and fetal          position. ---------------------------------------------------------------------- Cervix Uterus Adnexa  Cervix  Length:            4.4  cm.  Normal appearance by transvaginal scan  Uterus  Single fibroid noted, see table below.  Left Ovary  Within normal limits.  Right Ovary  Within normal limits. ---------------------------------------------------------------------- Myomas   Site                     L(cm)      W(cm)      D(cm)      Location   Anterior                 3.3        2          2.8  ----------------------------------------------------------------------   Blood Flow                 RI        PI       Comments  ---------------------------------------------------------------------- Impression  Singleton intrauterine pregnancy at 25+2 weeks with inpatient  admission for asthma exacerbation;  CHTN, GDM  Review of the anatomy shows no sonographic markers for  aneuploidy or structural anomalies; cardiac, intracranial and  spinal anatomy not well visualized.  Posterior placenta previa  Amniotic fluid volume is normal  Estimated fetal weight is 1017g which is growth in the 77th  percentile  Transvaginal cervical length 34m without funnelling or  changes in transfundal pressure ---------------------------------------------------------------------- Recommendations  Recommend rpeat scan in 4 weeks to assess growth and  complete anatomic survey ----------------------------------------------------------------------                 MGriffin Dakin MD Electronically Signed Final Report   09/16/2016 10:26 am ----------------------------------------------------------------------  [place]Mfm Ob Comp + 14 Wk  Result Date: 09/16/2016 ----------------------------------------------------------------------  OBSTETRICS REPORT                      (Signed Final 09/16/2016 10:26 am) ---------------------------------------------------------------------- Patient Info  ID #:       [id number]                        D.O.B.:   0April 02, 1982(35  yrs)  Name:       Tina Wall                Visit Date:  09/16/2016 08:58 am ---------------------------------------------------------------------- Performed By  Performed By:     Raquel James         Ref. Address:     [address]                                                             [place], [place]  Attending:        Griffin Dakin MD         Location:         [facility]  Referred By:      Chancy Milroy                    MD ---------------------------------------------------------------------- Orders   #  Description                                 Code   1  [place] MFM OB COMP + 14 WK                      76805.01   2  [place] MFM  OB TRANSVAGINAL                      00923.3  ----------------------------------------------------------------------   #  Ordered By               Order #        Accession #    Episode #   1  Arlina Robes            [id number]      [id number]     [id number]   2  MICHAEL ERVIN            [id number]      [id number]     [id number]  ---------------------------------------------------------------------- Indications   [redacted] weeks gestation of pregnancy                Z3A.25   Encounter for antenatal screening for          Z36.3   malformations   Asthma  O99.89 j45.909   Diabetes - Gestational                         O24.419   Hypertension - Chronic/Pre-existing            O10.019   History of cesarean delivery x2 currently      O34.219   pregnant   Obesity complicating pregnancy, second         O99.212   trimester  ---------------------------------------------------------------------- OB History  Gravidity:    4         Term:   3        Prem:   0        SAB:   0  TOP:          0       Ectopic:  0        Living: 0 ---------------------------------------------------------------------- Fetal Evaluation  Num Of Fetuses:     1  Fetal Heart         159  Rate(bpm):  Cardiac Activity:   Observed  Presentation:       Cephalic  Placenta:           Posterior Previa  Amniotic Fluid  AFI FV:      Subjectively within normal limits                              Largest Pocket(cm)                              4.45 ---------------------------------------------------------------------- Biometry  BPD:      66.6  mm     G. Age:  26w 6d         78  %    CI:        80.84   %   70 - 86                                                          FL/HC:      20.9   %   18.6 - 20.4  HC:      233.9  mm     G. Age:  25w 3d         19  %    HC/AC:      1.00       1.04 - 1.22  AC:      234.3  mm     G. Age:  27w 5d         92  %    FL/BPD:     73.6   %   71 - 87  FL:         49  mm     G. Age:  26w 4d         60  %     FL/AC:      20.9   %   20 - 24  CER:        32  mm     G. Age:  28w 0d         90  %  CM:  5.3  mm  Est. FW:    1017  gm      2 lb 4 oz     77  % ---------------------------------------------------------------------- Gestational Age  LMP:           25w 5d       Date:   03/20/16                 EDD:   12/25/16  U/S Today:     26w 5d                                        EDD:   12/18/16  Best:          25w 5d    Det. By:   LMP  (03/20/16)          EDD:   12/25/16 ---------------------------------------------------------------------- Anatomy  Cranium:               Appears normal         Aortic Arch:            Appears normal  Cavum:                 Appears normal         Ductal Arch:            Not well visualized  Ventricles:            Appears normal         Diaphragm:              Appears normal  Choroid Plexus:        Not well visualized    Stomach:                Appears normal, left                                                                        sided  Cerebellum:            Appears normal         Abdomen:                Appears normal  Posterior Fossa:       Appears normal         Abdominal Wall:         Not well visualized  Nuchal Fold:           Not applicable (>98    Cord Vessels:           Appears normal ([redacted]                         wks GA)                                        vessel cord)  Face:                  Not well visualized    Kidneys:  Appear normal  Lips:                  Not well visualized    Bladder:                Appears normal  Thoracic:              Appears normal         Spine:                  Not well visualized  Heart:                 Appears normal         Upper Extremities:      Appears normal                         (4CH, axis, and situs  RVOT:                  Appears normal         Lower Extremities:      Appears normal  LVOT:                  Appears normal  Other:  Female gender. Technically difficult due to maternal habitus and fetal          position.  ---------------------------------------------------------------------- Cervix Uterus Adnexa  Cervix  Length:            4.4  cm.  Normal appearance by transvaginal scan  Uterus  Single fibroid noted, see table below.  Left Ovary  Within normal limits.  Right Ovary  Within normal limits. ---------------------------------------------------------------------- Myomas   Site                     L(cm)      W(cm)      D(cm)      Location   Anterior                 3.3        2          2.8  ----------------------------------------------------------------------   Blood Flow                 RI        PI       Comments  ---------------------------------------------------------------------- Impression  Singleton intrauterine pregnancy at 25+2 weeks with inpatient  admission for asthma exacerbation; CHTN, GDM  Review of the anatomy shows no sonographic markers for  aneuploidy or structural anomalies; cardiac, intracranial and  spinal anatomy not well visualized.  Posterior placenta previa  Amniotic fluid volume is normal  Estimated fetal weight is 1017g which is growth in the 77th  percentile  Transvaginal cervical length 34m without funnelling or  changes in transfundal pressure ---------------------------------------------------------------------- Recommendations  Recommend rpeat scan in 4 weeks to assess growth and  complete anatomic survey ----------------------------------------------------------------------                 MGriffin Dakin MD Electronically Signed Final Report   09/16/2016 10:26 am ----------------------------------------------------------------------   Current scheduled medications . aspirin  81 mg Oral Daily  . benzonatate  200 mg Oral TID  . docusate sodium  100 mg Oral Daily  . ipratropium-albuterol  3 mL Nebulization Q4H  . NIFEdipine  30 mg Oral Daily  . predniSONE  50 mg Oral BID WC  . prenatal vitamin w/FE, FA  1 tablet  Oral Daily  . sodium chloride flush  3 mL Intravenous Q12H    I  have reviewed the patient's current medications.  ASSESSMENT: Principal Problem:   Asthma affecting pregnancy, antepartum   PLAN: Will change from IV to oral steroids, continue respiratory regimen Reassuring FHT Elevated BS due to steroids, continue to monitor closely and cover as needed. Reassuring fetal status. Continue routine antenatal care.   Verita Schneiders, MD, [place] Attending [place], [facility]

## 2016-09-18 DIAGNOSIS — O9989 Other specified diseases and conditions complicating pregnancy, childbirth and the puerperium: Secondary | ICD-10-CM

## 2016-09-18 DIAGNOSIS — O99519 Diseases of the respiratory system complicating pregnancy, unspecified trimester: Secondary | ICD-10-CM

## 2016-09-18 DIAGNOSIS — J45909 Unspecified asthma, uncomplicated: Secondary | ICD-10-CM

## 2016-09-18 LAB — GLUCOSE, CAPILLARY
Glucose-Capillary: 106 mg/dL — ABNORMAL HIGH (ref 65–99)
Glucose-Capillary: 101 mg/dL — ABNORMAL HIGH (ref 65–99)
Glucose-Capillary: 99 mg/dL (ref 65–99)
Glucose-Capillary: 124 mg/dL — ABNORMAL HIGH (ref 65–99)
Glucose-Capillary: 121 mg/dL — ABNORMAL HIGH (ref 65–99)
Glucose-Capillary: 157 mg/dL — ABNORMAL HIGH (ref 65–99)

## 2016-09-18 MED ORDER — MENTHOL 3 MG MT LOZG
1.0000 | LOZENGE | OROMUCOSAL | Status: DC | PRN
  Filled 2016-09-18: qty 9

## 2016-09-18 NOTE — Progress Notes (Signed)
Patient ID: Tina Wall, female   DOB: 12/20/1980, 36 y.o.   MRN: [id number]  [name]  Tina Wall is a 36 y.o. [id number] at [redacted]w[redacted]d who is admitted for asthma exacerbation.  Estimated Date of Delivery: 12/25/16. Fetal presentation is unsure.  Length of Stay:  2 Days. Admitted 09/16/2016  Subjective: [name], however, she reports the presence of thick mucus that she finds difficult to expectorate at times.  Patient reports good fetal movement.  She reports no uterine contractions, no bleeding and no loss of fluid per vagina.  Vitals:  Blood pressure 140/83, pulse (!) 110, temperature 98.1 F (36.7 C), temperature source Oral, resp. rate (!) 22, height 5\' 1"  (1.549 m), weight 231 lb (104.8 kg), last menstrual period 03/20/2016, SpO2 97 %. Physical Examination: CONSTITUTIONAL: Well-developed, well-nourished female in no acute distress.  CARDIOVASCULAR: Normal heart rate noted, regular rhythm RESPIRATORY: Expiratory wheezes noted throughout lung fields.  MUSCULOSKELETAL: Normal range of motion. No edema and no tenderness. 2+ distal pulses. ABDOMEN: Soft, nontender, gravid. CERVIX:  Deferred  Fetal monitoring: FHR: 160 bpm, Variability: moderate, Accelerations: 10 x 10 bmp Present, Decelerations: Absent  Uterine activity: No contractions  Results for orders placed or performed during the hospital encounter of 09/16/16 (from the past 48 hour(s))  Glucose, capillary     Status: None   Collection Time: 09/16/16  7:43 AM  Result Value Ref Range   Glucose-Capillary 77 65 - 99 mg/dL  Glucose, capillary     Status: None   Collection Time: 09/16/16 12:52 PM  Result Value Ref Range   Glucose-Capillary 86 65 - 99 mg/dL   Comment 1 Notify RN   Glucose, capillary     Status: Abnormal   Collection Time: 09/16/16  6:11 PM  Result Value Ref Range   Glucose-Capillary 158 (H) 65 - 99 mg/dL  Glucose,  capillary     Status: Abnormal   Collection Time: 09/16/16  9:13 PM  Result Value Ref Range   Glucose-Capillary 179 (H) 65 - 99 mg/dL  Glucose, capillary     Status: Abnormal   Collection Time: 09/17/16  7:03 AM  Result Value Ref Range   Glucose-Capillary 158 (H) 65 - 99 mg/dL  Glucose, capillary     Status: Abnormal   Collection Time: 09/17/16 10:54 AM  Result Value Ref Range   Glucose-Capillary 159 (H) 65 - 99 mg/dL  Glucose, capillary     Status: Abnormal   Collection Time: 09/17/16  4:34 PM  Result Value Ref Range   Glucose-Capillary 123 (H) 65 - 99 mg/dL   Comment 1 Notify RN   Glucose, capillary     Status: Abnormal   Collection Time: 09/17/16  8:23 PM  Result Value Ref Range   Glucose-Capillary 175 (H) 65 - 99 mg/dL   Comment 1 Notify RN    Comment 2 Document in Chart       Current scheduled medications . aspirin  81 mg Oral Daily  . benzonatate  200 mg Oral TID  . dextromethorphan-guaiFENesin  1 tablet Oral BID  . docusate sodium  100 mg Oral Daily  . insulin aspart  0-14 Units Subcutaneous TID WC  . ipratropium-albuterol  3 mL Nebulization Q4H  . NIFEdipine  30 mg Oral Daily  . predniSONE  50 mg Oral BID WC  . prenatal vitamin w/FE, FA  1 tablet Oral Daily  . sodium chloride flush  3 mL Intravenous Q12H  I have reviewed the patient's current medications.  ASSESSMENT: Principal Problem:   Asthma affecting pregnancy, antepartum   PLAN: Continue oral steroids, continue respiratory regimen Elevated BS due to steroids, continue to monitor closely and cover as needed. Reassuring fetal status. Continue routine antenatal care.

## 2016-09-19 LAB — GLUCOSE, CAPILLARY
Glucose-Capillary: 113 mg/dL — ABNORMAL HIGH (ref 65–99)
Glucose-Capillary: 105 mg/dL — ABNORMAL HIGH (ref 65–99)
Glucose-Capillary: 108 mg/dL — ABNORMAL HIGH (ref 65–99)
Glucose-Capillary: 111 mg/dL — ABNORMAL HIGH (ref 65–99)
Glucose-Capillary: 137 mg/dL — ABNORMAL HIGH (ref 65–99)
Glucose-Capillary: 116 mg/dL — ABNORMAL HIGH (ref 65–99)
Glucose-Capillary: 138 mg/dL — ABNORMAL HIGH (ref 65–99)
Glucose-Capillary: 151 mg/dL — ABNORMAL HIGH (ref 65–99)

## 2016-09-19 MED ORDER — INSULIN ASPART 100 UNIT/ML ~~LOC~~ SOLN
0.0000 [IU] | Freq: Four times a day (QID) | SUBCUTANEOUS | Status: DC
  Administered 2016-09-19: 1 [IU] via SUBCUTANEOUS
  Administered 2016-09-19: 2 [IU] via SUBCUTANEOUS
  Administered 2016-09-19 – 2016-09-20 (×3): 1 [IU] via SUBCUTANEOUS

## 2016-09-19 NOTE — Progress Notes (Signed)
Suggestion to Faculty practice.   Patient has mentioned to Resp. Therapist that she is scared that she will not be able to breath when she arrives home. Patient has an appointment with [facility] on Friday.  Patient lives in [place] and there is a Clinical cytogeneticist at this facility.    Can the patient please get a consult for the pulmo visit. She is currently taking just albuterol and has a history of Asthma and has no preventative meds whatsoever.  She also has bronchitis.  Patient has NEVER seen a pulmo.   Can we please be assured to send the patient home with [facility], script, please.   You may also speak with Amy Black, RT for any other questions at ext. [phone].

## 2016-09-19 NOTE — Progress Notes (Signed)
Spoke with Dr. Roselie Awkward regarding patient's disposition.

## 2016-09-19 NOTE — Progress Notes (Signed)
Dr. Nehemiah Settle on unit update pt CBG 138 after 1hr PPD, insulin held and will be done at 2000.

## 2016-09-19 NOTE — Progress Notes (Signed)
Patient ID: Tina Wall, female   DOB: [birth year], 36 y.o.   MRN: [id number]  [place] NOTE  GENEICE SCHANK is a 36 y.o. G4P3003 at [redacted]w[redacted]d who is admitted for asthma exacerbation.  Estimated Date of Delivery: 12/25/16. Fetal presentation is unsure.  Length of Stay:  3 Days. Admitted 09/16/2016  Subjective: Patient reports having a tough day yesterday with her breathing, much improved today.  Patient reports good fetal movement.  She reports no uterine contractions, no bleeding and no loss of fluid per vagina.  Vitals:  Blood pressure (!) 148/83, pulse 91, temperature 98 F (36.7 C), temperature source Oral, resp. rate 18, height 5\' 1"  (1.549 m), weight 231 lb (104.8 kg), last menstrual period 03/20/2016, SpO2 93 %. Physical Examination: CONSTITUTIONAL: Well-developed, well-nourished female in no acute distress.  CARDIOVASCULAR: Normal heart rate noted, regular rhythm RESPIRATORY: Mild expiratory wheezes noted throughout lung fields.  MUSCULOSKELETAL: Normal range of motion. No edema and no tenderness. 2+ distal pulses. ABDOMEN: Soft, nontender, gravid. CERVIX:  Deferred  Fetal monitoring: FHR: 160 bpm, Variability: moderate, Accelerations: 10 x 10 bmp Present, Decelerations: Absent  Uterine activity: No contractions  Results for orders placed or performed during the hospital encounter of 09/16/16 (from the past 48 hour(s))  Glucose, capillary     Status: Abnormal   Collection Time: 09/17/16 10:54 AM  Result Value Ref Range   Glucose-Capillary 159 (H) 65 - 99 mg/dL  Glucose, capillary     Status: Abnormal   Collection Time: 09/17/16  4:34 PM  Result Value Ref Range   Glucose-Capillary 123 (H) 65 - 99 mg/dL   Comment 1 Notify RN   Glucose, capillary     Status: Abnormal   Collection Time: 09/17/16  8:23 PM  Result Value Ref Range   Glucose-Capillary 175 (H) 65 - 99 mg/dL   Comment 1 Notify RN    Comment 2 Document in Chart   Glucose,  capillary     Status: Abnormal   Collection Time: 09/18/16  9:19 AM  Result Value Ref Range   Glucose-Capillary 106 (H) 65 - 99 mg/dL  Glucose, capillary     Status: Abnormal   Collection Time: 09/18/16 11:34 AM  Result Value Ref Range   Glucose-Capillary 101 (H) 65 - 99 mg/dL  Glucose, capillary     Status: None   Collection Time: 09/18/16 12:54 PM  Result Value Ref Range   Glucose-Capillary 99 65 - 99 mg/dL  Glucose, capillary     Status: Abnormal   Collection Time: 09/18/16  4:11 PM  Result Value Ref Range   Glucose-Capillary 124 (H) 65 - 99 mg/dL  Glucose, capillary     Status: Abnormal   Collection Time: 09/18/16  6:46 PM  Result Value Ref Range   Glucose-Capillary 121 (H) 65 - 99 mg/dL  Glucose, capillary     Status: Abnormal   Collection Time: 09/18/16  8:29 PM  Result Value Ref Range   Glucose-Capillary 157 (H) 65 - 99 mg/dL  Glucose, capillary     Status: Abnormal   Collection Time: 09/19/16  7:40 AM  Result Value Ref Range   Glucose-Capillary 113 (H) 65 - 99 mg/dL      Current scheduled medications . aspirin  81 mg Oral Daily  . benzonatate  200 mg Oral TID  . dextromethorphan-guaiFENesin  1 tablet Oral BID  . docusate sodium  100 mg Oral Daily  . insulin aspart  0-14 Units Subcutaneous QID  . ipratropium-albuterol  3 mL Nebulization Q4H  . NIFEdipine  30 mg Oral Daily  . predniSONE  50 mg Oral BID WC  . prenatal vitamin w/FE, FA  1 tablet Oral Daily  . sodium chloride flush  3 mL Intravenous Q12H    I have reviewed the patient's current medications.  ASSESSMENT: Principal Problem:   Asthma affecting pregnancy, antepartum   PLAN: Continue oral steroids, continue respiratory regimen Elevated BS due to steroids, continue to monitor closely and cover as needed. Reassuring fetal status. Continue routine antenatal care. Plan for discharge tomorrow if remains stable.  Verita Schneiders, MD, [place] Attending [place], [facility] for [employer], [place]

## 2016-09-20 DIAGNOSIS — O99512 Diseases of the respiratory system complicating pregnancy, second trimester: Principal | ICD-10-CM

## 2016-09-20 LAB — GLUCOSE, CAPILLARY
Glucose-Capillary: 107 mg/dL — ABNORMAL HIGH (ref 65–99)
Glucose-Capillary: 112 mg/dL — ABNORMAL HIGH (ref 65–99)

## 2016-09-20 MED ORDER — PREDNISONE 20 MG PO TABS: 40.0000 mg | ORAL_TABLET | Freq: Every day | ORAL | 0 refills | Status: DC

## 2016-09-20 MED ORDER — PREDNISONE 20 MG PO TABS: ORAL_TABLET | ORAL | 0 refills | Status: DC

## 2016-09-20 MED ORDER — NIFEDIPINE ER 30 MG PO TB24: 30.0000 mg | ORAL_TABLET | Freq: Every day | ORAL | 3 refills | Status: DC

## 2016-09-20 MED ORDER — IPRATROPIUM-ALBUTEROL 0.5-2.5 (3) MG/3ML IN SOLN: 3.0000 mL | RESPIRATORY_TRACT | 0 refills | Status: DC

## 2016-09-20 MED ORDER — ALBUTEROL SULFATE HFA 108 (90 BASE) MCG/ACT IN AERS: 2.0000 | INHALATION_SPRAY | RESPIRATORY_TRACT | 3 refills | Status: DC | PRN

## 2016-09-20 MED ORDER — BENZONATATE 200 MG PO CAPS: 200.0000 mg | ORAL_CAPSULE | Freq: Three times a day (TID) | ORAL | 0 refills | Status: DC

## 2016-09-20 NOTE — Progress Notes (Signed)
Spoke with resident, patient wishes to speak with MD prior to leaving at 1400

## 2016-09-20 NOTE — Discharge Summary (Addendum)
Physician Discharge Summary  Patient ID: SABRINE KITZMAN MRN: [id number] DOB/AGE: [birth year] 36 y.o.  Admit date: 09/16/2016 Discharge date: 09/20/2016  Admission Diagnoses:  Discharge Diagnoses:  Principal Problem:   Asthma affecting pregnancy, antepartum   Discharged Condition: good  Hospital Course: patient admitted on 1/26 with asthma exacerbation on IV steroids and nebs. Gradually transitioned to oral steroids. Has been improving well. On room air. Patient being discharged to home.  Consults: None  Significant Diagnostic Studies: none  Treatments: steroids  Discharge Exam: Blood pressure 124/61, pulse 95, temperature 97.1 F (36.2 C), temperature source Oral, resp. rate 20, height 5\' 1"  (1.549 m), weight 231 lb (104.8 kg), last menstrual period 03/20/2016, SpO2 95 %. General appearance: alert, cooperative and no distress Resp: clear to auscultation bilaterally Cardio: regular rate and rhythm, S1, S2 normal, no murmur, click, rub or gallop GI: soft, non-tender; bowel sounds normal; no masses,  no organomegaly Extremities: extremities normal, atraumatic, no cyanosis or edema Pulses: 2+ and symmetric   NST x2 reactive.  Disposition: 01-Home or Self Care  Discharge Instructions    Notify physician for a general feeling that "something is not right"    Complete by:  As directed    Notify physician for increase or change in vaginal discharge    Complete by:  As directed    Notify physician for intestinal cramps, with or without diarrhea, sometimes described as "gas pain"    Complete by:  As directed    Notify physician for leaking of fluid    Complete by:  As directed    Notify physician for low, dull backache, unrelieved by heat or Tylenol    Complete by:  As directed    Notify physician for menstrual like cramps    Complete by:  As directed    Notify physician for pelvic pressure    Complete by:  As directed    Notify physician for uterine contractions.  These may  be painless and feel like the uterus is tightening or the baby is  "balling up"    Complete by:  As directed    Notify physician for vaginal bleeding    Complete by:  As directed    PRETERM LABOR:  Includes any of the follwing symptoms that occur between 20 - [redacted] weeks gestation.  If these symptoms are not stopped, preterm labor can result in preterm delivery, placing your baby at risk    Complete by:  As directed      Allergies as of 09/20/2016      Reactions   Ace Inhibitors Shortness Of Breath   Short of breath   Latex Other (See Comments)   PT IS UNSURE OF WHETHER OR NOT THIS IS AN ALLERGY      Medication List    STOP taking these medications   labetalol 200 MG tablet Commonly known as:  NORMODYNE     TAKE these medications   albuterol 108 (90 Base) MCG/ACT inhaler Commonly known as:  PROAIR HFA Inhale 2 puffs into the lungs every 4 (four) hours as needed for wheezing or shortness of breath. Reported on 10/08/2015   aspirin 81 MG chewable tablet Commonly known as:  ASPIRIN CHILDRENS Chew 1 tablet (81 mg total) by mouth daily.   HYDROcodone-homatropine 5-1.5 MG/5ML syrup Commonly known as:  HYCODAN Take 5 mLs by mouth every 6 (six) hours as needed for cough.   ipratropium-albuterol 0.5-2.5 (3) MG/3ML Soln Commonly known as:  DUONEB Take 3 mLs by nebulization every 4 (  four) hours.   NIFEdipine 30 MG 24 hr tablet Commonly known as:  PROCARDIA-XL/ADALAT CC Take 1 tablet (30 mg total) by mouth daily.   predniSONE 20 MG tablet Commonly known as:  DELTASONE Take 2 tablets (40 mg total) by mouth daily with breakfast.   PRENATAL COMPLETE PO Take by mouth.      Follow-up Information    [facility] Follow up on 09/23/2016.   Specialty:  Obstetrics and Gynecology Contact information: [address] [place] [phone]          Signed: Truett Mainland 09/20/2016, 7:50 AM

## 2016-09-20 NOTE — Progress Notes (Signed)
Patient has received discharge paperwork with [employer].  MD sat with patient, all questions addressed and answered. IV removed and uncomplicated.

## 2016-09-20 NOTE — Progress Notes (Signed)
Called resident again, patient would like to see MD prior to leaving .

## 2016-09-20 NOTE — Progress Notes (Signed)
MD at beside

## 2016-09-22 ENCOUNTER — Other Ambulatory Visit: Payer: Self-pay | Admitting: *Deleted

## 2016-09-22 NOTE — Patient Outreach (Signed)
[facility]) Care Management  09/22/2016  LEEAH LOBERG 1980-10-17 [id number]   Per Wilhemina Cash at [place], patient is currently active with internal [employer] CM, and no need for [place] to outreach to patient at this time, and will proceed with case closure.   Case closure due to other CM program request sent to [place] [facility] at [place] Management.     Quintus Premo H. Annia Friendly, BSN, [place] Management [facility] Telephonic CM Phone: [phone] Fax: [phone]

## 2016-09-23 ENCOUNTER — Ambulatory Visit (INDEPENDENT_AMBULATORY_CARE_PROVIDER_SITE_OTHER): Payer: Managed Care, Other (non HMO)

## 2016-09-23 ENCOUNTER — Other Ambulatory Visit: Payer: Managed Care, Other (non HMO)

## 2016-09-23 ENCOUNTER — Ambulatory Visit (INDEPENDENT_AMBULATORY_CARE_PROVIDER_SITE_OTHER): Payer: Managed Care, Other (non HMO) | Admitting: Obstetrics & Gynecology

## 2016-09-23 ENCOUNTER — Encounter: Payer: Self-pay | Admitting: Obstetrics & Gynecology

## 2016-09-23 VITALS — BP 140/78 | HR 82 | Temp 98.5°F | Wt 226.0 lb

## 2016-09-23 DIAGNOSIS — O24410 Gestational diabetes mellitus in pregnancy, diet controlled: Secondary | ICD-10-CM

## 2016-09-23 DIAGNOSIS — O0990 Supervision of high risk pregnancy, unspecified, unspecified trimester: Secondary | ICD-10-CM

## 2016-09-23 DIAGNOSIS — J4521 Mild intermittent asthma with (acute) exacerbation: Secondary | ICD-10-CM

## 2016-09-23 DIAGNOSIS — Z1389 Encounter for screening for other disorder: Secondary | ICD-10-CM

## 2016-09-23 DIAGNOSIS — O10919 Unspecified pre-existing hypertension complicating pregnancy, unspecified trimester: Secondary | ICD-10-CM

## 2016-09-23 DIAGNOSIS — O10912 Unspecified pre-existing hypertension complicating pregnancy, second trimester: Secondary | ICD-10-CM | POA: Diagnosis not present

## 2016-09-23 DIAGNOSIS — Z3A27 27 weeks gestation of pregnancy: Secondary | ICD-10-CM | POA: Diagnosis not present

## 2016-09-23 DIAGNOSIS — O99512 Diseases of the respiratory system complicating pregnancy, second trimester: Secondary | ICD-10-CM

## 2016-09-23 DIAGNOSIS — Z331 Pregnant state, incidental: Secondary | ICD-10-CM

## 2016-09-23 LAB — POCT URINALYSIS DIPSTICK
Blood, UA: NEGATIVE
Glucose, UA: NEGATIVE
Ketones, UA: NEGATIVE
Leukocytes, UA: NEGATIVE
Nitrite, UA: NEGATIVE

## 2016-09-23 NOTE — Progress Notes (Signed)
[place] [id number] wks,cephalic,post pl gr 0 cx 4.8 cm,fhr 143 bpm,normal ov's bilat,ant fibroid n/c,afi 10 cm,efw 1107 g 65%

## 2016-09-23 NOTE — Progress Notes (Signed)
Fetal Surveillance Testing today:  Sonogram is normal   High Risk Pregnancy Diagnosis(es):   CHTN, Class A1 DM, asthma exasebation  [id number] [redacted]w[redacted]d Estimated Date of Delivery: 12/25/16  Blood pressure 140/78, pulse 82, temperature 98.5 F (36.9 C), weight 226 lb (102.5 kg), last menstrual period 03/20/2016.  Urinalysis: Negative   HPI: The patient is being seen today for ongoing management of as above. Today she reports feeling better, but still with cough and wheezes Lung exam clear no wheezes at all   BP weight and urine results all reviewed and noted. Patient reports good fetal movement, denies any bleeding and no rupture of membranes symptoms or regular contractions.  Fundal Height:  28 Fetal Heart rate:  144 Edema:  none  Patient is without complaints other than noted in her HPI. All questions were answered.  All lab and sonogram results have been reviewed. Comments:    Assessment:  1.  Pregnancy at [redacted]w[redacted]d,  Estimated Date of Delivery: 12/25/16 :                          2.  Acute asthma exaserbation                        3.  CHTN                        4.  Class A1 DM  Medication(s) Plans:  Finishing prednisone today,   Treatment Plan:  Continue procardia xl 30 qd  Return in about 2 weeks (around 10/07/2016) for HROB. for appointment for high risk OB care  No orders of the defined types were placed in this encounter.  Orders Placed This Encounter  Procedures  . POCT urinalysis dipstick

## 2016-09-27 LAB — ANTIBODY SCREEN: Antibody Screen: NEGATIVE

## 2016-09-27 LAB — CBC
Hematocrit: 34.1 % (ref 34.0–46.6)
Hemoglobin: 10.7 g/dL — ABNORMAL LOW (ref 11.1–15.9)
MCH: 29.7 pg (ref 26.6–33.0)
MCHC: 31.4 g/dL — ABNORMAL LOW (ref 31.5–35.7)
MCV: 95 fL (ref 79–97)
Platelets: 272 10*3/uL (ref 150–379)
RBC: 3.6 x10E6/uL — ABNORMAL LOW (ref 3.77–5.28)
RDW: 15.3 % (ref 12.3–15.4)
WBC: 6.7 10*3/uL (ref 3.4–10.8)

## 2016-09-27 LAB — RPR, QUANT+TP ABS (REFLEX)
Rapid Plasma Reagin, Quant: 1:2 {titer} — ABNORMAL HIGH
T Pallidum Abs: POSITIVE — AB

## 2016-09-27 LAB — RPR: RPR Ser Ql: REACTIVE — AB

## 2016-09-27 LAB — HIV ANTIBODY (ROUTINE TESTING W REFLEX): HIV Screen 4th Generation wRfx: NONREACTIVE

## 2016-10-03 ENCOUNTER — Telehealth: Payer: Self-pay | Admitting: *Deleted

## 2016-10-03 NOTE — Telephone Encounter (Signed)
Patient states she had some mucous discharge yesterday, no bleeding or cramping. Informed patient that discharge can be normal during pregnancy. As long as she was not cramping, bleeding or having a watery discharge then it is normal. Pt verbalized understanding. Pt appointment rescheduled to Wednesday.

## 2016-10-04 ENCOUNTER — Ambulatory Visit (INDEPENDENT_AMBULATORY_CARE_PROVIDER_SITE_OTHER): Payer: Managed Care, Other (non HMO) | Admitting: Obstetrics & Gynecology

## 2016-10-04 ENCOUNTER — Encounter: Payer: Self-pay | Admitting: Obstetrics & Gynecology

## 2016-10-04 VITALS — BP 122/80 | HR 78 | Wt 225.0 lb

## 2016-10-04 DIAGNOSIS — O0990 Supervision of high risk pregnancy, unspecified, unspecified trimester: Secondary | ICD-10-CM

## 2016-10-04 DIAGNOSIS — O10913 Unspecified pre-existing hypertension complicating pregnancy, third trimester: Secondary | ICD-10-CM

## 2016-10-04 DIAGNOSIS — Z1389 Encounter for screening for other disorder: Secondary | ICD-10-CM

## 2016-10-04 DIAGNOSIS — Z331 Pregnant state, incidental: Secondary | ICD-10-CM

## 2016-10-04 DIAGNOSIS — O10919 Unspecified pre-existing hypertension complicating pregnancy, unspecified trimester: Secondary | ICD-10-CM

## 2016-10-04 DIAGNOSIS — O99513 Diseases of the respiratory system complicating pregnancy, third trimester: Secondary | ICD-10-CM

## 2016-10-04 DIAGNOSIS — O0993 Supervision of high risk pregnancy, unspecified, third trimester: Secondary | ICD-10-CM

## 2016-10-04 DIAGNOSIS — J4521 Mild intermittent asthma with (acute) exacerbation: Secondary | ICD-10-CM

## 2016-10-04 DIAGNOSIS — O24419 Gestational diabetes mellitus in pregnancy, unspecified control: Secondary | ICD-10-CM

## 2016-10-04 DIAGNOSIS — Z3A28 28 weeks gestation of pregnancy: Secondary | ICD-10-CM

## 2016-10-04 LAB — POCT URINALYSIS DIPSTICK
Blood, UA: NEGATIVE
Glucose, UA: NEGATIVE
Ketones, UA: NEGATIVE
Leukocytes, UA: NEGATIVE
Nitrite, UA: NEGATIVE
Protein, UA: 1

## 2016-10-04 MED ORDER — GLYBURIDE 2.5 MG PO TABS: 2.5000 mg | ORAL_TABLET | Freq: Two times a day (BID) | ORAL | 3 refills | Status: DC

## 2016-10-04 NOTE — Progress Notes (Signed)
Fetal Surveillance Testing today:  FHT 150   High Risk Pregnancy Diagnosis(es):   Class A1, now A2 DM  G4P3003 [redacted]w[redacted]d Estimated Date of Delivery: 12/25/16  Blood pressure 122/80, pulse 78, weight 225 lb (102.1 kg), last menstrual period 03/20/2016.  Urinalysis: Negative   HPI: The patient is being seen today for ongoing management of as above. Today she reports CBG are elevated fasting and post prandiol   BP weight and urine results all reviewed and noted. Patient reports good fetal movement, denies any bleeding and no rupture of membranes symptoms or regular contractions.  Fundal Height:  34 Fetal Heart rate:  145 Edema:  none  Patient is without complaints other than noted in her HPI. All questions were answered.  All lab and sonogram results have been reviewed. Comments:    Assessment:  1.  Pregnancy at [redacted]w[redacted]d,  Estimated Date of Delivery: 12/25/16 :                          2.  Class A1, now A2 DM                        3.  Acute asthma exaserbation                        4.  CHTN  Medication(s) Plans:  Procardia xl 30 qd, start glyburide 2.5 BID  Treatment Plan:  Per protocol  No Follow-up on file. for appointment for high risk OB care  No orders of the defined types were placed in this encounter.  Orders Placed This Encounter  Procedures  . POCT urinalysis dipstick

## 2016-10-05 ENCOUNTER — Encounter: Payer: Managed Care, Other (non HMO) | Admitting: Advanced Practice Midwife

## 2016-10-06 ENCOUNTER — Telehealth: Payer: Self-pay | Admitting: *Deleted

## 2016-10-06 NOTE — Telephone Encounter (Signed)
Patient called stating she is still having a mucous discharge with dark blood. Was seen yesterday by Dr Elonda Husky and was told it was normal. I informed patient that some women have more discharge than others and can have mucous the entire pregnancy. I also informed patient that she had a [facility] in her early pregnancy and that she could continue to have spotting related to that. Advised patient to call us if she started having heavy bleeding. Patient also states she does not drink much water during the day so I encouraged her to push fluids and empty her bladder frequently as dehydration and a full bladder can cause contractions. Patient verbalized understanding.

## 2016-10-07 ENCOUNTER — Inpatient Hospital Stay (HOSPITAL_COMMUNITY)
Admission: AD | Admit: 2016-10-07 | Discharge: 2016-10-08 | Disposition: A | Discharge status: Home / Self Care | Payer: Managed Care, Other (non HMO) | Source: Ambulatory Visit | Attending: Family Medicine | Admitting: Family Medicine

## 2016-10-07 DIAGNOSIS — Z3A28 28 weeks gestation of pregnancy: Secondary | ICD-10-CM | POA: Insufficient documentation

## 2016-10-07 DIAGNOSIS — O4693 Antepartum hemorrhage, unspecified, third trimester: Secondary | ICD-10-CM | POA: Insufficient documentation

## 2016-10-07 DIAGNOSIS — O26853 Spotting complicating pregnancy, third trimester: Secondary | ICD-10-CM

## 2016-10-07 NOTE — MAU Note (Signed)
Have been seeing mucousy d/c since last Sunday. Lost more on Monday and call my doctor's office. Was told that was normal. Saw Dr Trisha Mangle. And told everything ok. Weds lost a lot more mucous and it was brown. Thurs mucous was brown and called back to office. Told ok if it was not bright red. Today having mucousy d/c and it is bloody.

## 2016-10-08 ENCOUNTER — Encounter (HOSPITAL_COMMUNITY): Payer: Self-pay | Admitting: *Deleted

## 2016-10-08 DIAGNOSIS — O26853 Spotting complicating pregnancy, third trimester: Secondary | ICD-10-CM

## 2016-10-08 DIAGNOSIS — O4693 Antepartum hemorrhage, unspecified, third trimester: Secondary | ICD-10-CM | POA: Diagnosis present

## 2016-10-08 DIAGNOSIS — Z3A28 28 weeks gestation of pregnancy: Secondary | ICD-10-CM | POA: Diagnosis not present

## 2016-10-08 LAB — URINALYSIS, ROUTINE W REFLEX MICROSCOPIC
Bilirubin Urine: NEGATIVE
Glucose, UA: NEGATIVE mg/dL
Ketones, ur: NEGATIVE mg/dL
Leukocytes, UA: NEGATIVE
Nitrite: NEGATIVE
Protein, ur: NEGATIVE mg/dL
Specific Gravity, Urine: 1.012 (ref 1.005–1.030)
pH: 6 (ref 5.0–8.0)

## 2016-10-08 NOTE — MAU Provider Note (Signed)
History     CSN: [id number]  Arrival date and time: 10/07/16 2346   First Provider Initiated Contact with Patient 10/08/16 0111      Chief Complaint  Patient presents with  . Vaginal Bleeding   HPI [name] is a 36yo [id number] @ 28.6wks who presents for eval for having occ bloody mucous with wiping. Has not required a pad. No cramping, pain, or leaking fluid. Has been having intermittent mucousy discharge x 5 days now, but the other days it has been brownish. Last IC x 4d ago. Has chronic asthma and is on a treatment regimen for that. Bld type O+.  OB History    Gravida Para Term Preterm AB Living   4 3 3     3    SAB TAB Ectopic Multiple Live Births           3      Past Medical History:  Diagnosis Date  . Asthma   . Bronchitis   . Diabetes mellitus without complication ([place])   . GI bleeding   . HSV-2 (herpes simplex virus 2) infection   . Hx of chlamydia infection   . Hx of trichomoniasis   . Hypertension     Past Surgical History:  Procedure Laterality Date  . CESAREAN SECTION    . FOOT SURGERY    . TONSILLECTOMY    . WISDOM TOOTH EXTRACTION      Family History  Problem Relation Age of Onset  . Hypertension Mother   . Diabetes Mother   . Kidney disease Mother   . Cancer Father     trachea   . Alzheimer's disease Paternal Grandmother     Social History  Substance Use Topics  . Smoking status: Never Smoker  . Smokeless tobacco: Never Used  . Alcohol use No     Comment: occasionally    Allergies:  Allergies  Allergen Reactions  . Ace Inhibitors Shortness Of Breath    Short of breath  . Latex Other (See Comments)    PT IS UNSURE OF WHETHER OR NOT THIS IS AN ALLERGY    Facility-Administered Medications Prior to Admission  Medication Dose Route Frequency Provider Last Rate Last Dose  . ipratropium-albuterol (DUONEB) 0.5-2.5 (3) MG/3ML nebulizer solution 3 mL  3 mL Nebulization Q6H Jonnie Kind, MD       Prescriptions Prior to Admission   Medication Sig Dispense Refill Last Dose  . albuterol (PROAIR HFA) 108 (90 Base) MCG/ACT inhaler Inhale 2 puffs into the lungs every 4 (four) hours as needed for wheezing or shortness of breath. Reported on 10/08/2015 1 Inhaler 3 Past Week at Unknown time  . aspirin (ASPIRIN CHILDRENS) 81 MG chewable tablet Chew 1 tablet (81 mg total) by mouth daily. 30 tablet 6 Past Week at Unknown time  . benzonatate (TESSALON) 200 MG capsule Take 1 capsule (200 mg total) by mouth 3 (three) times daily. 20 capsule 0 Past Week at Unknown time  . glyBURIDE (DIABETA) 2.5 MG tablet Take 1 tablet (2.5 mg total) by mouth 2 (two) times daily with a meal. 60 tablet 3 10/07/2016 at Unknown time  . ipratropium-albuterol (DUONEB) 0.5-2.5 (3) MG/3ML SOLN Take 3 mLs by nebulization every 4 (four) hours. 360 mL 0 Past Month at Unknown time  . NIFEdipine (PROCARDIA-XL/ADALAT CC) 30 MG 24 hr tablet Take 1 tablet (30 mg total) by mouth daily. 30 tablet 3 Past Week at Unknown time  . Prenatal Vit-Fe Fumarate-FA (PRENATAL COMPLETE PO) Take by mouth.  10/07/2016 at Unknown time    Review of Systems No other pertinents other than what is listed in HPI Physical Exam   Blood pressure 150/79, pulse 90, temperature 99 F (37.2 C), resp. rate 18, height 5\' 1"  (1.549 m), weight 104.4 kg (230 lb 3.2 oz), last menstrual period 03/20/2016, SpO2 99 %.  Physical Exam  Constitutional: She is oriented to person, place, and time. She appears well-developed.  HENT:  Head: Normocephalic.  Neck: Normal range of motion.  Cardiovascular: Normal rate.   Respiratory:  Decreased in bases; frequent coughing, non prod  GI:  EFM 150s, +accels, no decels No ctx per toco  Genitourinary:  Genitourinary Comments: SE: sm tan d/c seen at cx, cx C/L (no blood or bloody mucous seen)  Musculoskeletal: Normal range of motion.  Neurological: She is alert and oriented to person, place, and time.  Skin: Skin is warm and dry.  Psychiatric: She has a normal  mood and affect. Her behavior is normal. Thought content normal.   Urinalysis    Component Value Date/Time   COLORURINE YELLOW 10/08/2016 0005   APPEARANCEUR CLEAR 10/08/2016 0005   APPEARANCEUR Cloudy (A) 05/23/2016 1640   LABSPEC 1.012 10/08/2016 0005   PHURINE 6.0 10/08/2016 0005   GLUCOSEU NEGATIVE 10/08/2016 0005   HGBUR MODERATE (A) 10/08/2016 0005   BILIRUBINUR NEGATIVE 10/08/2016 0005   BILIRUBINUR Negative 05/23/2016 1640   KETONESUR NEGATIVE 10/08/2016 0005   PROTEINUR NEGATIVE 10/08/2016 0005   UROBILINOGEN negative 04/02/2015 1537   UROBILINOGEN 0.2 03/23/2011 2046   NITRITE NEGATIVE 10/08/2016 0005   LEUKOCYTESUR NEGATIVE 10/08/2016 0005   LEUKOCYTESUR Negative 05/23/2016 1640    MAU Course  Procedures  MDM UA ordered  Assessment and Plan  IUP@28 .6wks Bloody mucous  D/C home with bleeding precautions- rev'd potential causes with pt and reassured that this does not appear to be uterine  Pelvic rest x 7d  F/U at next scheduled FT visit or sooner with increased vag bldg  SHAW, [place] 10/08/2016, 1:26 AM

## 2016-10-08 NOTE — Discharge Instructions (Signed)
Vaginal Bleeding During Pregnancy, Third Trimester A small amount of bleeding (spotting) from the vagina is common in pregnancy. Sometimes the bleeding is normal and is not a problem, and sometimes it is a sign of something serious. Be sure to tell your doctor about any bleeding from your vagina right away. Follow these instructions at home:  Watch your condition for any changes.  Follow your doctor's instructions about how active you can be.  If you are on bed rest:  You may need to stay in bed and only get up to use the bathroom.  You may be allowed to do some activities.  If you need help, make plans for someone to help you.  Write down:  The number of pads you use each day.  How often you change pads.  How soaked (saturated) your pads are.  Do not use tampons.  Do not douche.  Do not have sex or orgasms until your doctor says it is okay.  Follow your doctor's advice about lifting, driving, and doing physical activities.  If you pass any tissue from your vagina, save the tissue so you can show it to your doctor.  Only take medicines as told by your doctor.  Do not take aspirin because it can make you bleed.  Keep all follow-up visits as told by your doctor. Contact a doctor if:  You bleed from your vagina.  You have cramps.  You have labor pains.  You have a fever that does not go away after you take medicine. Get help right away if:  You have very bad cramps in your back or belly (abdomen).  You have chills.  You have a gush of fluid from your vagina.  You pass large clots or tissue from your vagina.  You bleed more.  You feel light-headed or weak.  You pass out (faint).  You do not feel your baby move around as much as before. This information is not intended to replace advice given to you by your health care provider. Make sure you discuss any questions you have with your health care provider. Document Released: 12/23/2013 Document Revised:  01/14/2016 Document Reviewed: 04/15/2013 Elsevier Interactive Patient Education  2017 [employer].

## 2016-10-10 ENCOUNTER — Encounter: Payer: Managed Care, Other (non HMO) | Admitting: Women's Health

## 2016-10-18 ENCOUNTER — Encounter: Payer: Self-pay | Admitting: Obstetrics & Gynecology

## 2016-10-18 ENCOUNTER — Ambulatory Visit (INDEPENDENT_AMBULATORY_CARE_PROVIDER_SITE_OTHER): Payer: Managed Care, Other (non HMO) | Admitting: Obstetrics & Gynecology

## 2016-10-18 VITALS — BP 122/72 | HR 104 | Wt 228.7 lb

## 2016-10-18 DIAGNOSIS — O24419 Gestational diabetes mellitus in pregnancy, unspecified control: Secondary | ICD-10-CM

## 2016-10-18 DIAGNOSIS — Z3A30 30 weeks gestation of pregnancy: Secondary | ICD-10-CM

## 2016-10-18 DIAGNOSIS — O10919 Unspecified pre-existing hypertension complicating pregnancy, unspecified trimester: Secondary | ICD-10-CM

## 2016-10-18 DIAGNOSIS — Z331 Pregnant state, incidental: Secondary | ICD-10-CM

## 2016-10-18 DIAGNOSIS — O10913 Unspecified pre-existing hypertension complicating pregnancy, third trimester: Secondary | ICD-10-CM

## 2016-10-18 DIAGNOSIS — O0993 Supervision of high risk pregnancy, unspecified, third trimester: Secondary | ICD-10-CM

## 2016-10-18 DIAGNOSIS — O0990 Supervision of high risk pregnancy, unspecified, unspecified trimester: Secondary | ICD-10-CM

## 2016-10-18 DIAGNOSIS — Z1389 Encounter for screening for other disorder: Secondary | ICD-10-CM

## 2016-10-18 LAB — POCT URINALYSIS DIPSTICK
Blood, UA: NEGATIVE
Glucose, UA: NEGATIVE
Ketones, UA: NEGATIVE
Leukocytes, UA: NEGATIVE
Nitrite, UA: NEGATIVE
Protein, UA: NEGATIVE

## 2016-10-18 MED ORDER — OMEPRAZOLE 20 MG PO CPDR: 20.0000 mg | DELAYED_RELEASE_CAPSULE | Freq: Every day | ORAL | 6 refills | Status: DC

## 2016-10-18 NOTE — Progress Notes (Signed)
Fetal Surveillance Testing today:  none   High Risk Pregnancy Diagnosis(es):   Class A2 DM, CHTN Asthma  G4P3003 [redacted]w[redacted]d Estimated Date of Delivery: 12/25/16  Blood pressure 122/72, pulse (!) 104, weight 228 lb 11.2 oz (103.7 kg), last menstrual period 03/20/2016.  Urinalysis: Negative   HPI: The patient is being seen today for ongoing management of as above. Today she reports still coughing, not taking her pm glyburide regularly, her fasting BS are high.  Instructed to tae her pm glyburide   BP weight and urine results all reviewed and noted. Patient reports good fetal movement, denies any bleeding and no rupture of membranes symptoms or regular contractions.  Fundal Height:  34 Fetal Heart rate:  150 Edema:  none  Patient is without complaints other than noted in her HPI. All questions were answered.  All lab and sonogram results have been reviewed. Comments:    Assessment:  1.  Pregnancy at [redacted]w[redacted]d,  Estimated Date of Delivery: 12/25/16 :                          2.  Class A2 DM                        3.  CHTN                        4.  Asthma  Medication(s) Plans:  Continue glyburide 2.5 Mg BID, procardia xl 30 daily, asthma meds, prilosec 20  Treatment Plan:  Twice weekly surveillance, sonogram alternating with NST, induction at 39 weeks or as clinically indicated   Return in about 2 weeks (around 11/01/2016) for BPP/sono, HROB. for appointment for high risk OB care  Meds ordered this encounter  Medications  . omeprazole (PRILOSEC) 20 MG capsule    Sig: Take 1 capsule (20 mg total) by mouth daily.    Dispense:  30 capsule    Refill:  6   Orders Placed This Encounter  Procedures  . US Fetal BPP W/O Non Stress  . [place] UA Cord Doppler  . US OB Follow Up  . POCT urinalysis dipstick

## 2016-10-21 ENCOUNTER — Other Ambulatory Visit: Payer: Self-pay | Admitting: Obstetrics & Gynecology

## 2016-10-21 ENCOUNTER — Telehealth: Payer: Self-pay | Admitting: *Deleted

## 2016-10-21 DIAGNOSIS — O24419 Gestational diabetes mellitus in pregnancy, unspecified control: Secondary | ICD-10-CM

## 2016-10-21 MED ORDER — ACCU-CHEK FASTCLIX LANCETS MISC: 1.0000 | Freq: Four times a day (QID) | 12 refills | Status: DC

## 2016-10-21 MED ORDER — GLUCOSE BLOOD VI STRP: ORAL_STRIP | 12 refills | Status: DC

## 2016-10-21 NOTE — Telephone Encounter (Signed)
I have attempted to call this pharmacy 3 times, once being on hold for over 10 minutes. Patient needs meter, strips and lancets.

## 2016-10-23 ENCOUNTER — Encounter (HOSPITAL_COMMUNITY): Payer: Self-pay | Admitting: *Deleted

## 2016-10-23 ENCOUNTER — Inpatient Hospital Stay (HOSPITAL_COMMUNITY)
Admission: AD | Admit: 2016-10-23 | Discharge: 2016-10-23 | Disposition: A | Discharge status: Home / Self Care | Payer: Managed Care, Other (non HMO) | Source: Ambulatory Visit | Attending: Obstetrics and Gynecology | Admitting: Obstetrics and Gynecology

## 2016-10-23 DIAGNOSIS — Z79899 Other long term (current) drug therapy: Secondary | ICD-10-CM | POA: Insufficient documentation

## 2016-10-23 DIAGNOSIS — R0789 Other chest pain: Secondary | ICD-10-CM

## 2016-10-23 DIAGNOSIS — Z331 Pregnant state, incidental: Secondary | ICD-10-CM

## 2016-10-23 DIAGNOSIS — O24113 Pre-existing diabetes mellitus, type 2, in pregnancy, third trimester: Secondary | ICD-10-CM | POA: Diagnosis not present

## 2016-10-23 DIAGNOSIS — Z7982 Long term (current) use of aspirin: Secondary | ICD-10-CM | POA: Insufficient documentation

## 2016-10-23 DIAGNOSIS — Z3A31 31 weeks gestation of pregnancy: Secondary | ICD-10-CM | POA: Diagnosis not present

## 2016-10-23 DIAGNOSIS — O26893 Other specified pregnancy related conditions, third trimester: Secondary | ICD-10-CM | POA: Diagnosis not present

## 2016-10-23 DIAGNOSIS — E119 Type 2 diabetes mellitus without complications: Secondary | ICD-10-CM | POA: Insufficient documentation

## 2016-10-23 DIAGNOSIS — R109 Unspecified abdominal pain: Secondary | ICD-10-CM | POA: Diagnosis present

## 2016-10-23 DIAGNOSIS — R0781 Pleurodynia: Secondary | ICD-10-CM | POA: Insufficient documentation

## 2016-10-23 DIAGNOSIS — O163 Unspecified maternal hypertension, third trimester: Secondary | ICD-10-CM | POA: Insufficient documentation

## 2016-10-23 DIAGNOSIS — Z7984 Long term (current) use of oral hypoglycemic drugs: Secondary | ICD-10-CM | POA: Diagnosis not present

## 2016-10-23 DIAGNOSIS — O9989 Other specified diseases and conditions complicating pregnancy, childbirth and the puerperium: Secondary | ICD-10-CM | POA: Diagnosis not present

## 2016-10-23 HISTORY — DX: Cardiac murmur, unspecified: R01.1

## 2016-10-23 LAB — URINALYSIS, ROUTINE W REFLEX MICROSCOPIC
Bilirubin Urine: NEGATIVE
Glucose, UA: NEGATIVE mg/dL
Hgb urine dipstick: NEGATIVE
Ketones, ur: 5 mg/dL — AB
Leukocytes, UA: NEGATIVE
Nitrite: NEGATIVE
Protein, ur: NEGATIVE mg/dL
Specific Gravity, Urine: 1.021 (ref 1.005–1.030)
pH: 5 (ref 5.0–8.0)

## 2016-10-23 LAB — COMPREHENSIVE METABOLIC PANEL
ALT: 14 U/L (ref 14–54)
AST: 23 U/L (ref 15–41)
Albumin: 3.3 g/dL — ABNORMAL LOW (ref 3.5–5.0)
Alkaline Phosphatase: 65 U/L (ref 38–126)
Anion gap: 7 (ref 5–15)
BUN: 5 mg/dL — ABNORMAL LOW (ref 6–20)
CO2: 23 mmol/L (ref 22–32)
Calcium: 8.7 mg/dL — ABNORMAL LOW (ref 8.9–10.3)
Chloride: 106 mmol/L (ref 101–111)
Creatinine, Ser: 0.57 mg/dL (ref 0.44–1.00)
GFR calc Af Amer: >60 mL/min (ref >60)
GFR calc non Af Amer: >60 mL/min (ref >60)
Glucose, Bld: 95 mg/dL (ref 65–99)
Potassium: 3.2 mmol/L — ABNORMAL LOW (ref 3.5–5.1)
Sodium: 136 mmol/L (ref 135–145)
Total Bilirubin: 0.3 mg/dL (ref 0.3–1.2)
Total Protein: 6.8 g/dL (ref 6.5–8.1)

## 2016-10-23 LAB — CBC WITH DIFFERENTIAL/PLATELET
Basophils Absolute: 0 10*3/uL (ref 0.0–0.1)
Basophils Relative: 1 %
Eosinophils Absolute: 0.1 10*3/uL (ref 0.0–0.7)
Eosinophils Relative: 2 %
HCT: 34.1 % — ABNORMAL LOW (ref 36.0–46.0)
Hemoglobin: 11.2 g/dL — ABNORMAL LOW (ref 12.0–15.0)
Lymphocytes Relative: 45 %
Lymphs Abs: 1.8 10*3/uL (ref 0.7–4.0)
MCH: 30.6 pg (ref 26.0–34.0)
MCHC: 32.8 g/dL (ref 30.0–36.0)
MCV: 93.2 fL (ref 78.0–100.0)
Monocytes Absolute: 0.2 10*3/uL (ref 0.1–1.0)
Monocytes Relative: 5 %
Neutro Abs: 1.9 10*3/uL (ref 1.7–7.7)
Neutrophils Relative %: 47 %
Platelets: 215 10*3/uL (ref 150–400)
RBC: 3.66 MIL/uL — ABNORMAL LOW (ref 3.87–5.11)
RDW: 14.1 % (ref 11.5–15.5)
WBC: 4 10*3/uL (ref 4.0–10.5)

## 2016-10-23 MED ORDER — CYCLOBENZAPRINE HCL 10 MG PO TABS: 10.0000 mg | ORAL_TABLET | Freq: Three times a day (TID) | ORAL | 0 refills | Status: DC | PRN

## 2016-10-23 NOTE — MAU Note (Signed)
Pt states she has RUQ pain for several days, has recently had bronchitis, has been coughing some, not as much as before.  The pain was waking her up last night, can't lay on R side.  Was having "skips in breathing" last night.  Denies contractions, bleeding or LOF.  No fever, vomiting or diarrhea.

## 2016-10-23 NOTE — Discharge Instructions (Signed)
Chest Wall Pain °Chest wall pain is pain in or around the bones and muscles of your chest. Sometimes, an injury causes this pain. Sometimes, the cause may not be known. This pain may take several weeks or longer to get better. °Follow these instructions at home: °Pay attention to any changes in your symptoms. Take these actions to help with your pain: °· Rest as told by your health care provider. °· Avoid activities that cause pain. These include any activities that use your chest muscles or your abdominal and side muscles to lift heavy items. °· If directed, apply ice to the painful area: °¨ Put ice in a plastic bag. °¨ Place a towel between your skin and the bag. °¨ Leave the ice on for 20 minutes, 2-3 times per day. °· Take over-the-counter and prescription medicines only as told by your health care provider. °· Do not use tobacco products, including cigarettes, chewing tobacco, and e-cigarettes. If you need help quitting, ask your health care provider. °· Keep all follow-up visits as told by your health care provider. This is important. °Contact a health care provider if: °· You have a fever. °· Your chest pain becomes worse. °· You have new symptoms. °Get help right away if: °· You have nausea or vomiting. °· You feel sweaty or light-headed. °· You have a cough with phlegm (sputum) or you cough up blood. °· You develop shortness of breath. °This information is not intended to replace advice given to you by your health care provider. Make sure you discuss any questions you have with your health care provider. °Document Released: 08/08/2005 Document Revised: 12/17/2015 Document Reviewed: 11/03/2014 °Elsevier Interactive Patient Education © 2017 Elsevier Inc. ° °

## 2016-10-23 NOTE — MAU Provider Note (Signed)
Chief Complaint:  Abdominal Pain   First Provider Initiated Contact with Patient 10/23/16 1342     HPI: Tina Wall is a 36 y.o. G4P3003 at [redacted]w[redacted]d who presents to maternity admissions reporting sharp right rib pain x 4 days. Dx'd w/ bronchitis in January. Sx have improved significantly, but pt is still needing to cough forcefully sometimes.   Location: right ribs under right breast Quality: sore Severity: 8/10 in pain scale at worst Duration: 4 days Context: recent bronchitis and cough Timing: intermittent Modifying factors: Worse w/ mvmt, lying on right side, coughing Associated signs and symptoms: Pos for cough. Neg for fever, chills, N/V/D/C, vision changes, HA, chest pain, SOB.  Denies contractions, leakage of fluid or vaginal bleeding. Good fetal movement.   Pregnancy Course:   Past Medical History:  Diagnosis Date  . Asthma   . Bronchitis   . Diabetes mellitus without complication ([place])   . GI bleeding   . Heart murmur   . HSV-2 (herpes simplex virus 2) infection   . Hx of chlamydia infection   . Hx of trichomoniasis   . Hypertension    OB History  Gravida Para Term Preterm AB Living  4 3 3     3   SAB TAB Ectopic Multiple Live Births          3    # Outcome Date GA Lbr Len/2nd Weight Sex Delivery Anes PTL Lv  4 Current           3 Term 09/01/05 [redacted]w[redacted]d  6 lb 12 oz (3.062 kg) F CS-LTranv  N LIV  2 Term 10/25/01 [redacted]w[redacted]d  8 lb 6 oz (3.799 kg) F CS-Classical  N      Complications: Fetal Intolerance  1 Term 08/29/96 [redacted]w[redacted]d  6 lb 12 oz (3.062 kg) F Vag-Spont EPI Y LIV     Past Surgical History:  Procedure Laterality Date  . CESAREAN SECTION     C/S x 2  . FOOT SURGERY    . TONSILLECTOMY    . WISDOM TOOTH EXTRACTION     Family History  Problem Relation Age of Onset  . Hypertension Mother   . Diabetes Mother   . Kidney disease Mother   . Cancer Father     trachea   . Alzheimer's disease Paternal Grandmother    Social History  Substance Use Topics  . Smoking  status: Never Smoker  . Smokeless tobacco: Never Used  . Alcohol use No     Comment: occasionally   Allergies  Allergen Reactions  . Ace Inhibitors Shortness Of Breath    Short of breath  . Latex Other (See Comments)    PT IS UNSURE OF WHETHER OR NOT THIS IS AN ALLERGY   Facility-Administered Medications Prior to Admission  Medication Dose Route Frequency Provider Last Rate Last Dose  . ipratropium-albuterol (DUONEB) 0.5-2.5 (3) MG/3ML nebulizer solution 3 mL  3 mL Nebulization Q6H Jonnie Kind, MD       Prescriptions Prior to Admission  Medication Sig Dispense Refill Last Dose  . ACCU-CHEK FASTCLIX LANCETS MISC 1 Device by Percutaneous route 4 (four) times daily. 100 each 12 Past Week at Unknown time  . albuterol (PROAIR HFA) 108 (90 Base) MCG/ACT inhaler Inhale 2 puffs into the lungs every 4 (four) hours as needed for wheezing or shortness of breath. Reported on 10/08/2015 1 Inhaler 3 unknown  . aspirin (ASPIRIN CHILDRENS) 81 MG chewable tablet Chew 1 tablet (81 mg total) by mouth daily. 30 tablet  6 10/22/2016 at Unknown time  . glucose blood test strip Use as instructed 100 each 12 Past Week at Unknown time  . glyBURIDE (DIABETA) 2.5 MG tablet Take 1 tablet (2.5 mg total) by mouth 2 (two) times daily with a meal. (Patient taking differently: Take 2.5 mg by mouth daily. ) 60 tablet 3 10/22/2016 at Unknown time  . ipratropium-albuterol (DUONEB) 0.5-2.5 (3) MG/3ML SOLN Take 3 mLs by nebulization every 4 (four) hours. (Patient taking differently: Take 3 mLs by nebulization every 4 (four) hours as needed (for cough/wheezing). ) 360 mL 0 unknown  . NIFEdipine (PROCARDIA-XL/ADALAT CC) 30 MG 24 hr tablet Take 1 tablet (30 mg total) by mouth daily. 30 tablet 3 10/22/2016 at Unknown time  . omeprazole (PRILOSEC) 20 MG capsule Take 1 capsule (20 mg total) by mouth daily. 30 capsule 6 10/22/2016 at Unknown time  . Prenatal Vit-Fe Fumarate-FA (PRENATAL COMPLETE PO) Take 1 tablet by mouth daily.    10/22/2016  at Unknown time  . benzonatate (TESSALON) 200 MG capsule Take 1 capsule (200 mg total) by mouth 3 (three) times daily. (Patient not taking: Reported on 10/18/2016) 20 capsule 0 Not Taking    I have reviewed patient's Past Medical Hx, Surgical Hx, Family Hx, Social Hx, medications and allergies.   ROS:  Review of Systems  Constitutional: Negative for chills and fever.  Eyes: Negative for visual disturbance.  Respiratory: Positive for cough. Negative for choking, chest tightness, shortness of breath, wheezing and stridor.   Cardiovascular: Negative for chest pain.  Gastrointestinal: Negative for abdominal distention, abdominal pain, constipation, diarrhea, nausea and vomiting.  Genitourinary: Negative for vaginal bleeding.  Neurological: Negative for headaches.    Physical Exam  Patient Vitals for the past 24 hrs:  BP Temp Temp src Pulse Resp SpO2  10/23/16 1324 126/72 - - 87 - -  10/23/16 1241 139/83 98.5 F (36.9 C) Oral 94 18 100 %   Constitutional: Well-developed, well-nourished female in no acute distress.  Cardiovascular: normal rate Respiratory: normal effort GI: Abd soft, mild-mod TTP over right, lower anterior ribs under breast, gravid appropriate for gestational age. Pos BS x 4. Neg Murphy's sign. No palpable liver enlargement Neurologic: Alert and oriented x 4.   FHT:  Baseline 140 , moderate variability, accelerations present, no decelerations Contractions: UI   Labs: Results for orders placed or performed during the hospital encounter of 10/23/16 (from the past 24 hour(s))  Urinalysis, Routine w reflex microscopic     Status: Abnormal   Collection Time: 10/23/16 12:30 PM  Result Value Ref Range   Color, Urine YELLOW YELLOW   APPearance HAZY (A) CLEAR   Specific Gravity, Urine 1.021 1.005 - 1.030   pH 5.0 5.0 - 8.0   Glucose, UA NEGATIVE NEGATIVE mg/dL   Hgb urine dipstick NEGATIVE NEGATIVE   Bilirubin Urine NEGATIVE NEGATIVE   Ketones, ur 5 (A) NEGATIVE mg/dL    Protein, ur NEGATIVE NEGATIVE mg/dL   Nitrite NEGATIVE NEGATIVE   Leukocytes, UA NEGATIVE NEGATIVE  Comprehensive metabolic panel     Status: Abnormal   Collection Time: 10/23/16  1:05 PM  Result Value Ref Range   Sodium 136 135 - 145 mmol/L   Potassium 3.2 (L) 3.5 - 5.1 mmol/L   Chloride 106 101 - 111 mmol/L   CO2 23 22 - 32 mmol/L   Glucose, Bld 95 65 - 99 mg/dL   BUN 5 (L) 6 - 20 mg/dL   Creatinine, Ser 0.57 0.44 - 1.00 mg/dL   Calcium  8.7 (L) 8.9 - 10.3 mg/dL   Total Protein 6.8 6.5 - 8.1 g/dL   Albumin 3.3 (L) 3.5 - 5.0 g/dL   AST 23 15 - 41 U/L   ALT 14 14 - 54 U/L   Alkaline Phosphatase 65 38 - 126 U/L   Total Bilirubin 0.3 0.3 - 1.2 mg/dL   GFR calc non Af Amer >60 >60 mL/min   GFR calc Af Amer >60 >60 mL/min   Anion gap 7 5 - 15  CBC with Differential/Platelet     Status: Abnormal   Collection Time: 10/23/16  1:05 PM  Result Value Ref Range   WBC 4.0 4.0 - 10.5 K/uL   RBC 3.66 (L) 3.87 - 5.11 MIL/uL   Hemoglobin 11.2 (L) 12.0 - 15.0 g/dL   HCT 34.1 (L) 36.0 - 46.0 %   MCV 93.2 78.0 - 100.0 fL   MCH 30.6 26.0 - 34.0 pg   MCHC 32.8 30.0 - 36.0 g/dL   RDW 14.1 11.5 - 15.5 %   Platelets 215 150 - 400 K/uL   Neutrophils Relative % 47 %   Neutro Abs 1.9 1.7 - 7.7 K/uL   Lymphocytes Relative 45 %   Lymphs Abs 1.8 0.7 - 4.0 K/uL   Monocytes Relative 5 %   Monocytes Absolute 0.2 0.1 - 1.0 K/uL   Eosinophils Relative 2 %   Eosinophils Absolute 0.1 0.0 - 0.7 K/uL   Basophils Relative 1 %   Basophils Absolute 0.0 0.0 - 0.1 K/uL    Imaging:  No results found.  MAU Course: Orders Placed This Encounter  Procedures  . Urinalysis, Routine w reflex microscopic  . Comprehensive metabolic panel  . CBC with Differential/Platelet  . Ice to affected area  . Discharge patient   Meds ordered this encounter  Medications  . cyclobenzaprine (FLEXERIL) 10 MG tablet    Sig: Take 1 tablet (10 mg total) by mouth 3 (three) times daily as needed for muscle spasms.    Dispense:   20 tablet    Refill:  0    Order Specific Question:   Supervising Provider    Answer:   Arlina Robes L [[id number]]    MDM: - Discussed that pain is possibly caused by either costocondritis or less likely cracked rib. Offered X-ray, but explained that there is no difference in management. Pt declines X-ray or pain meds. Reassured by [facility] labs. No evidence of emergent condition.   Assessment: 1. Rib pain on right side   2. Pregnancy, incidental   3. Acute chest wall pain     Plan: Discharge home in stable condition.  Preterm Labor precautions and fetal kick counts Follow-up Information    [facility] Follow up.   Specialty:  Obstetrics and Gynecology Why:  as scheduled for prenatal appointment  Contact information: [address] [place], NP Follow up.   Specialty:  Internal Medicine Why:  As needed if symptoms worsen Contact information: [place] [address] [place] Follow up.   Why:  in pregnancy emergencies Contact information: [address] [id number] [place] [phone]          Allergies as of 10/23/2016      Reactions   Ace Inhibitors Shortness Of Breath   Short of breath   Latex Other (See  Comments)   PT IS UNSURE OF WHETHER OR NOT THIS IS AN ALLERGY      Medication List    STOP taking these medications   benzonatate 200 MG capsule Commonly known as:  TESSALON     TAKE these medications   ACCU-CHEK FASTCLIX LANCETS Misc 1 Device by Percutaneous route 4 (four) times daily.   albuterol 108 (90 Base) MCG/ACT inhaler Commonly known as:  PROAIR HFA Inhale 2 puffs into the lungs every 4 (four) hours as needed for wheezing or shortness of breath. Reported on 10/08/2015   aspirin 81 MG chewable tablet Commonly known as:  ASPIRIN CHILDRENS Chew 1  tablet (81 mg total) by mouth daily.   cyclobenzaprine 10 MG tablet Commonly known as:  FLEXERIL Take 1 tablet (10 mg total) by mouth 3 (three) times daily as needed for muscle spasms.   glucose blood test strip Use as instructed   glyBURIDE 2.5 MG tablet Commonly known as:  DIABETA Take 1 tablet (2.5 mg total) by mouth 2 (two) times daily with a meal. What changed:  when to take this   ipratropium-albuterol 0.5-2.5 (3) MG/3ML Soln Commonly known as:  DUONEB Take 3 mLs by nebulization every 4 (four) hours. What changed:  when to take this  reasons to take this   NIFEdipine 30 MG 24 hr tablet Commonly known as:  PROCARDIA-XL/ADALAT CC Take 1 tablet (30 mg total) by mouth daily.   omeprazole 20 MG capsule Commonly known as:  PRILOSEC Take 1 capsule (20 mg total) by mouth daily.   PRENATAL COMPLETE PO Take 1 tablet by mouth daily.       [place], CNM 10/23/2016 2:17 PM

## 2016-10-27 ENCOUNTER — Encounter: Payer: Self-pay | Admitting: Obstetrics & Gynecology

## 2016-11-01 ENCOUNTER — Encounter: Payer: Self-pay | Admitting: Obstetrics & Gynecology

## 2016-11-01 ENCOUNTER — Ambulatory Visit (INDEPENDENT_AMBULATORY_CARE_PROVIDER_SITE_OTHER): Payer: Managed Care, Other (non HMO) | Admitting: Obstetrics & Gynecology

## 2016-11-01 ENCOUNTER — Ambulatory Visit (INDEPENDENT_AMBULATORY_CARE_PROVIDER_SITE_OTHER): Payer: Managed Care, Other (non HMO)

## 2016-11-01 ENCOUNTER — Other Ambulatory Visit: Payer: Managed Care, Other (non HMO)

## 2016-11-01 VITALS — BP 142/86 | HR 90 | Wt 230.0 lb

## 2016-11-01 DIAGNOSIS — Z3A32 32 weeks gestation of pregnancy: Secondary | ICD-10-CM | POA: Diagnosis not present

## 2016-11-01 DIAGNOSIS — O24419 Gestational diabetes mellitus in pregnancy, unspecified control: Secondary | ICD-10-CM | POA: Diagnosis not present

## 2016-11-01 DIAGNOSIS — O10913 Unspecified pre-existing hypertension complicating pregnancy, third trimester: Secondary | ICD-10-CM | POA: Diagnosis not present

## 2016-11-01 DIAGNOSIS — Z1389 Encounter for screening for other disorder: Secondary | ICD-10-CM

## 2016-11-01 DIAGNOSIS — O10919 Unspecified pre-existing hypertension complicating pregnancy, unspecified trimester: Secondary | ICD-10-CM

## 2016-11-01 DIAGNOSIS — O0993 Supervision of high risk pregnancy, unspecified, third trimester: Secondary | ICD-10-CM

## 2016-11-01 DIAGNOSIS — Z331 Pregnant state, incidental: Secondary | ICD-10-CM

## 2016-11-01 DIAGNOSIS — O0990 Supervision of high risk pregnancy, unspecified, unspecified trimester: Secondary | ICD-10-CM

## 2016-11-01 LAB — POCT URINALYSIS DIPSTICK
Blood, UA: NEGATIVE
Glucose, UA: NEGATIVE
Ketones, UA: NEGATIVE
Leukocytes, UA: NEGATIVE
Nitrite, UA: NEGATIVE
Protein, UA: NEGATIVE

## 2016-11-01 NOTE — Progress Notes (Signed)
Fetal Surveillance Testing today:  BPP 8/8 with statistically elevated Doppler flow ratios   High Risk Pregnancy Diagnosis(es):   Class A2 DM, CHTN  [id number] [redacted]w[redacted]d Estimated Date of Delivery: 12/25/16  Blood pressure (!) 142/86, pulse 90, weight 230 lb (104.3 kg), last menstrual period 03/20/2016.  Urinalysis: Negative   HPI: The patient is being seen today for ongoing management of as above. Today she reports BP have been ok, BS a little uneven, getting better   BP weight and urine results all reviewed and noted. Patient reports good fetal movement, denies any bleeding and no rupture of membranes symptoms or regular contractions.  Fundal Height:  36 Fetal Heart rate:  160 Edema:  none  Patient is without complaints other than noted in her HPI. All questions were answered.  All lab and sonogram results have been reviewed. Comments:    Assessment:  1.  Pregnancy at [redacted]w[redacted]d,  Estimated Date of Delivery: 12/25/16 :                          2.  Class A2 DM                        3.  CHTN  Medication(s) Plans:  Glyburide 2.5 mg , 1/2 tablet in pm, 1 tablet in am, nifedipine SR 30 daily  Treatment Plan:  Twice weekly surveillance, sonogram alternating with NST, repeat C section at 39 weeks or as clinically indicated   Return in about 3 days (around 11/04/2016) for NST, HROB. for appointment for high risk OB care  No orders of the defined types were placed in this encounter.  Orders Placed This Encounter  Procedures  . POCT urinalysis dipstick

## 2016-11-01 NOTE — Progress Notes (Signed)
[place] 15+1 wks,cephalic,post pl gr 1,normal ov's bilat,BPP 8/8,AFI 10 cm,fhr 160 bpm,RI .61,.71,.72  84%,EFW 2265 g 68%

## 2016-11-03 ENCOUNTER — Ambulatory Visit (INDEPENDENT_AMBULATORY_CARE_PROVIDER_SITE_OTHER): Payer: Managed Care, Other (non HMO) | Admitting: Obstetrics and Gynecology

## 2016-11-03 ENCOUNTER — Encounter: Payer: Self-pay | Admitting: Obstetrics and Gynecology

## 2016-11-03 VITALS — BP 138/78 | HR 107 | Wt 231.2 lb

## 2016-11-03 DIAGNOSIS — Z3A33 33 weeks gestation of pregnancy: Secondary | ICD-10-CM

## 2016-11-03 DIAGNOSIS — O1203 Gestational edema, third trimester: Secondary | ICD-10-CM

## 2016-11-03 DIAGNOSIS — O10913 Unspecified pre-existing hypertension complicating pregnancy, third trimester: Secondary | ICD-10-CM

## 2016-11-03 DIAGNOSIS — O0990 Supervision of high risk pregnancy, unspecified, unspecified trimester: Secondary | ICD-10-CM

## 2016-11-03 DIAGNOSIS — O0993 Supervision of high risk pregnancy, unspecified, third trimester: Secondary | ICD-10-CM

## 2016-11-03 DIAGNOSIS — Z1389 Encounter for screening for other disorder: Secondary | ICD-10-CM

## 2016-11-03 DIAGNOSIS — O24410 Gestational diabetes mellitus in pregnancy, diet controlled: Secondary | ICD-10-CM | POA: Diagnosis not present

## 2016-11-03 DIAGNOSIS — Z331 Pregnant state, incidental: Secondary | ICD-10-CM

## 2016-11-03 LAB — POCT URINALYSIS DIPSTICK
Blood, UA: NEGATIVE
Glucose, UA: NEGATIVE
Ketones, UA: NEGATIVE
Leukocytes, UA: NEGATIVE
Nitrite, UA: NEGATIVE
Protein, UA: NEGATIVE

## 2016-11-03 NOTE — Progress Notes (Signed)
High Risk Pregnancy HROB Diagnosis(es):   Class A2 DM, CHTN  [id number] [redacted]w[redacted]d Estimated Date of Delivery: 12/25/16    HPI: The patient is being seen today for ongoing management of above.  She currently complains or left ear pain that is made worse with palpation or manipulation of the same ear. She states she noticed the pain when she was attempting to sleep on that side.  Patient reports good fetal movement, denies any bleeding and no rupture of membranes symptoms or regular contractions.   BP weight and urine results reviewed and noted. Blood pressure 138/78, pulse (!) 107, weight 231 lb 3.2 oz (104.9 kg), last menstrual period 03/20/2016.  Fundal Height:  37 Fetal Heart rate:  140 with accelerations to 70 Physical Examination: Abdomen - soft, nontender, nondistended, no masses or organomegaly                                     Pelvic - examination not indicated                                     Edema:  2+    Ear: external canal cyst  Urinalysis:NEGATIVE   Fetal Surveillance Testing today:  NST, reactive   Lab and sonogram results have been reviewed. Comments:    Assessment:  1.  Pregnancy at [redacted]w[redacted]d,  [id number]   :  Estimated Date of Delivery: 12/25/16                        2.  CHTN                        3. Class A2 DM   4. Left external ear canal has sebaceous cyst, monitor for now but refer to Dr. Benjamine Mola if it persists  Medication(s) Plans:  Glyburide 2.5 mg: 1/2 tablet in PM and 1 whole tablet in AM. Nifedipine SR 30 daily   Treatment Plan:  Twice weekly surveillance, sonogram alternating with NST, repeat C section at 39 weeks or as clinically indicated.  Follow up in 4 days for appointment for high risk OB care, BPP  By signing my name below, I, Sonum Patel, attest that this documentation has been prepared under the direction and in the presence of Jonnie Kind, MD. Electronically Signed: Sonum Patel, Scribe. 11/03/16. 3:03 PM.  I personally performed the services  described in this documentation, which was SCRIBED in my presence. The recorded information has been reviewed and considered accurate. It has been edited as necessary during review. Jonnie Kind, MD

## 2016-11-06 ENCOUNTER — Inpatient Hospital Stay (HOSPITAL_COMMUNITY)
Admission: AD | Admit: 2016-11-06 | Discharge: 2016-11-06 | Disposition: A | Discharge status: Home / Self Care | Payer: Managed Care, Other (non HMO) | Source: Ambulatory Visit | Attending: Obstetrics and Gynecology | Admitting: Obstetrics and Gynecology

## 2016-11-06 ENCOUNTER — Encounter (HOSPITAL_COMMUNITY): Payer: Self-pay | Admitting: *Deleted

## 2016-11-06 DIAGNOSIS — O163 Unspecified maternal hypertension, third trimester: Secondary | ICD-10-CM | POA: Diagnosis not present

## 2016-11-06 DIAGNOSIS — Z3A33 33 weeks gestation of pregnancy: Secondary | ICD-10-CM | POA: Insufficient documentation

## 2016-11-06 DIAGNOSIS — O99513 Diseases of the respiratory system complicating pregnancy, third trimester: Secondary | ICD-10-CM | POA: Insufficient documentation

## 2016-11-06 DIAGNOSIS — O24113 Pre-existing diabetes mellitus, type 2, in pregnancy, third trimester: Secondary | ICD-10-CM | POA: Insufficient documentation

## 2016-11-06 DIAGNOSIS — E119 Type 2 diabetes mellitus without complications: Secondary | ICD-10-CM | POA: Diagnosis not present

## 2016-11-06 DIAGNOSIS — J45909 Unspecified asthma, uncomplicated: Secondary | ICD-10-CM | POA: Diagnosis not present

## 2016-11-06 DIAGNOSIS — R1011 Right upper quadrant pain: Secondary | ICD-10-CM

## 2016-11-06 DIAGNOSIS — O26893 Other specified pregnancy related conditions, third trimester: Secondary | ICD-10-CM | POA: Insufficient documentation

## 2016-11-06 LAB — COMPREHENSIVE METABOLIC PANEL
ALT: 13 U/L — ABNORMAL LOW (ref 14–54)
AST: 22 U/L (ref 15–41)
Albumin: 3.2 g/dL — ABNORMAL LOW (ref 3.5–5.0)
Alkaline Phosphatase: 88 U/L (ref 38–126)
Anion gap: 6 (ref 5–15)
BUN: <5 mg/dL — ABNORMAL LOW (ref 6–20)
CO2: 24 mmol/L (ref 22–32)
Calcium: 9 mg/dL (ref 8.9–10.3)
Chloride: 106 mmol/L (ref 101–111)
Creatinine, Ser: 0.52 mg/dL (ref 0.44–1.00)
GFR calc Af Amer: >60 mL/min (ref >60)
GFR calc non Af Amer: >60 mL/min (ref >60)
Glucose, Bld: 112 mg/dL — ABNORMAL HIGH (ref 65–99)
Potassium: 3.3 mmol/L — ABNORMAL LOW (ref 3.5–5.1)
Sodium: 136 mmol/L (ref 135–145)
Total Bilirubin: 0.6 mg/dL (ref 0.3–1.2)
Total Protein: 6.6 g/dL (ref 6.5–8.1)

## 2016-11-06 LAB — URINALYSIS, ROUTINE W REFLEX MICROSCOPIC
Bilirubin Urine: NEGATIVE
Glucose, UA: NEGATIVE mg/dL
Hgb urine dipstick: NEGATIVE
Ketones, ur: NEGATIVE mg/dL
Leukocytes, UA: NEGATIVE
Nitrite: NEGATIVE
Protein, ur: NEGATIVE mg/dL
Specific Gravity, Urine: 1.004 — ABNORMAL LOW (ref 1.005–1.030)
pH: 6 (ref 5.0–8.0)

## 2016-11-06 LAB — CBC
HCT: 33.7 % — ABNORMAL LOW (ref 36.0–46.0)
Hemoglobin: 11.1 g/dL — ABNORMAL LOW (ref 12.0–15.0)
MCH: 30.9 pg (ref 26.0–34.0)
MCHC: 32.9 g/dL (ref 30.0–36.0)
MCV: 93.9 fL (ref 78.0–100.0)
Platelets: 218 10*3/uL (ref 150–400)
RBC: 3.59 MIL/uL — ABNORMAL LOW (ref 3.87–5.11)
RDW: 14.2 % (ref 11.5–15.5)
WBC: 5 10*3/uL (ref 4.0–10.5)

## 2016-11-06 LAB — AMYLASE: Amylase: 149 U/L — ABNORMAL HIGH (ref 28–100)

## 2016-11-06 LAB — PROTEIN / CREATININE RATIO, URINE
Creatinine, Urine: 45 mg/dL
Total Protein, Urine: <6 mg/dL

## 2016-11-06 LAB — LIPASE, BLOOD: Lipase: 34 U/L (ref 11–51)

## 2016-11-06 MED ORDER — NALBUPHINE HCL 10 MG/ML IJ SOLN
10.0000 mg | Freq: Once | INTRAMUSCULAR | Status: AC
  Administered 2016-11-06: 10 mg via INTRAMUSCULAR
  Filled 2016-11-06: qty 1

## 2016-11-06 MED ORDER — PROMETHAZINE HCL 25 MG/ML IJ SOLN
12.5000 mg | Freq: Once | INTRAMUSCULAR | Status: AC
  Administered 2016-11-06: 12.5 mg via INTRAMUSCULAR
  Filled 2016-11-06: qty 1

## 2016-11-06 NOTE — MAU Note (Signed)
Having a strong pain in her rib on her right side, going around to her back. Can't lift her arm up, makes her arm hurt.  Was here 3/*4 about the rib pain, it got really bad today.  No productive cough, sinus drainage

## 2016-11-06 NOTE — Discharge Instructions (Signed)
Low-Fat Diet for Pancreatitis or Gallbladder Conditions A low-fat diet can be helpful if you have pancreatitis or a gallbladder condition. With these conditions, your pancreas and gallbladder have trouble digesting fats. A healthy eating plan with less fat will help rest your pancreas and gallbladder and reduce your symptoms. What do I need to know about this diet?  Eat a low-fat diet. ? Reduce your fat intake to less than 20-30% of your total daily calories. This is less than 50-60 g of fat per day. ? Remember that you need some fat in your diet. Ask your dietician what your daily goal should be. ? Choose nonfat and low-fat healthy foods. Look for the words "nonfat," "low fat," or "fat free." ? As a guide, look on the label and choose foods with less than 3 g of fat per serving. Eat only one serving.  Avoid alcohol.  Do not smoke. If you need help quitting, talk with your health care provider.  Eat small frequent meals instead of three large heavy meals. What foods can I eat? Grains Include healthy grains and starches such as potatoes, wheat bread, fiber-rich cereal, and brown rice. Choose whole grain options whenever possible. In adults, whole grains should account for 45-65% of your daily calories. Fruits and Vegetables Eat plenty of fruits and vegetables. Fresh fruits and vegetables add fiber to your diet. Meats and Other Protein Sources Eat lean meat such as chicken and pork. Trim any fat off of meat before cooking it. Eggs, fish, and beans are other sources of protein. In adults, these foods should account for 10-35% of your daily calories. Dairy Choose low-fat milk and dairy options. Dairy includes fat and protein, as well as calcium. Fats and Oils Limit high-fat foods such as fried foods, sweets, baked goods, sugary drinks. Other Creamy sauces and condiments, such as mayonnaise, can add extra fat. Think about whether or not you need to use them, or use smaller amounts or low fat  options. What foods are not recommended?  High fat foods, such as: ? Baked goods. ? Ice cream. ? French toast. ? Sweet rolls. ? Pizza. ? Cheese bread. ? Foods covered with batter, butter, creamy sauces, or cheese. ? Fried foods. ? Sugary drinks and desserts.  Foods that cause gas or bloating This information is not intended to replace advice given to you by your health care provider. Make sure you discuss any questions you have with your health care provider. Document Released: 08/13/2013 Document Revised: 01/14/2016 Document Reviewed: 07/22/2013 Elsevier Interactive Patient Education  2017 Elsevier Inc.  

## 2016-11-06 NOTE — MAU Provider Note (Signed)
History   G4P3003 @ 33 wks in with c/o RUQ pain since eating 3 hrs ago. Pain is 8/10. Denies ROM or vag bleeding. States good fetal movement,  CSN: [id number]  Arrival date & time 11/06/16  1826   None     Chief Complaint  Patient presents with  . Chest Pain    HPI  Past Medical History:  Diagnosis Date  . Asthma   . Bronchitis   . Diabetes mellitus without complication ([place])   . GI bleeding   . Heart murmur   . HSV-2 (herpes simplex virus 2) infection   . Hx of chlamydia infection   . Hx of trichomoniasis   . Hypertension     Past Surgical History:  Procedure Laterality Date  . CESAREAN SECTION     C/S x 2  . FOOT SURGERY    . TONSILLECTOMY    . WISDOM TOOTH EXTRACTION      Family History  Problem Relation Age of Onset  . Hypertension Mother   . Diabetes Mother   . Kidney disease Mother   . Cancer Father     trachea   . Alzheimer's disease Paternal Grandmother     Social History  Substance Use Topics  . Smoking status: Never Smoker  . Smokeless tobacco: Never Used  . Alcohol use No     Comment: occasionally    OB History    Gravida Para Term Preterm AB Living   4 3 3     3    SAB TAB Ectopic Multiple Live Births           3      Review of Systems  Constitutional: Negative.   HENT: Negative.   Eyes: Negative.   Respiratory: Negative.   Cardiovascular: Negative.   Gastrointestinal: Positive for abdominal pain.  Genitourinary: Negative.   Musculoskeletal: Negative.   Skin: Negative.     Allergies  Ace inhibitors and Latex  Home Medications    BP (!) 151/78   Pulse 86   Temp 98.9 F (37.2 C) (Oral)   Resp 20   Wt 231 lb 12 oz (105.1 kg)   LMP 03/20/2016 (Exact Date)   SpO2 98%   BMI 43.79 kg/m   Physical Exam  Constitutional: She is oriented to person, place, and time. She appears well-developed and well-nourished.  HENT:  Head: Normocephalic.  Eyes: Pupils are equal, round, and reactive to light.  Neck: Normal range of  motion.  Cardiovascular: Normal rate, regular rhythm, normal heart sounds and intact distal pulses.   Pulmonary/Chest: Effort normal and breath sounds normal.  Abdominal: There is tenderness.  Musculoskeletal: Normal range of motion.  Neurological: She is alert and oriented to person, place, and time. She has normal reflexes.  Skin: Skin is warm and dry.  Psychiatric: She has a normal mood and affect. Her behavior is normal. Judgment and thought content normal.    MAU Course  Procedures (including critical care time)  Labs Reviewed  URINALYSIS, ROUTINE W REFLEX MICROSCOPIC  CBC  COMPREHENSIVE METABOLIC PANEL  PROTEIN / CREATININE RATIO, URINE  AMYLASE  LIPASE, BLOOD   No results found.   No diagnosis found.    MDM  Labs stable, pain improved. POC discussed with Dr. Glo Herring pt to be d/c home to follow up at [facility] first thing in A.M. And to have GB untrasound done. inst given on low fat diet.

## 2016-11-07 ENCOUNTER — Other Ambulatory Visit: Payer: Self-pay | Admitting: Obstetrics and Gynecology

## 2016-11-07 ENCOUNTER — Other Ambulatory Visit: Payer: Self-pay | Admitting: Adult Health

## 2016-11-07 ENCOUNTER — Telehealth: Payer: Self-pay | Admitting: Adult Health

## 2016-11-07 ENCOUNTER — Encounter: Payer: Self-pay | Admitting: Obstetrics & Gynecology

## 2016-11-07 ENCOUNTER — Ambulatory Visit (HOSPITAL_COMMUNITY)
Admission: RE | Admit: 2016-11-07 | Discharge: 2016-11-07 | Disposition: A | Discharge status: Home / Self Care | Payer: Managed Care, Other (non HMO) | Source: Ambulatory Visit | Attending: Adult Health | Admitting: Adult Health

## 2016-11-07 ENCOUNTER — Ambulatory Visit (INDEPENDENT_AMBULATORY_CARE_PROVIDER_SITE_OTHER): Payer: Managed Care, Other (non HMO) | Admitting: Obstetrics & Gynecology

## 2016-11-07 VITALS — BP 140/80 | HR 88 | Wt 229.0 lb

## 2016-11-07 DIAGNOSIS — Z331 Pregnant state, incidental: Secondary | ICD-10-CM

## 2016-11-07 DIAGNOSIS — O10919 Unspecified pre-existing hypertension complicating pregnancy, unspecified trimester: Secondary | ICD-10-CM

## 2016-11-07 DIAGNOSIS — O24419 Gestational diabetes mellitus in pregnancy, unspecified control: Secondary | ICD-10-CM

## 2016-11-07 DIAGNOSIS — R1011 Right upper quadrant pain: Secondary | ICD-10-CM | POA: Insufficient documentation

## 2016-11-07 DIAGNOSIS — Z1389 Encounter for screening for other disorder: Secondary | ICD-10-CM

## 2016-11-07 DIAGNOSIS — O0990 Supervision of high risk pregnancy, unspecified, unspecified trimester: Secondary | ICD-10-CM

## 2016-11-07 LAB — POCT URINALYSIS DIPSTICK
Blood, UA: NEGATIVE
Glucose, UA: NEGATIVE
Ketones, UA: NEGATIVE
Leukocytes, UA: NEGATIVE
Nitrite, UA: NEGATIVE

## 2016-11-07 NOTE — Telephone Encounter (Signed)
Pt last ate yesterday at 4 pm has GB US at [facility] now, she is in [place] and is on her way, she says she is in pain, call after [place], for possible appt today, has appt tomorrow and BPP

## 2016-11-07 NOTE — Telephone Encounter (Signed)
Left message to call me back about GB US

## 2016-11-07 NOTE — Progress Notes (Signed)
Fetal Surveillance Testing today:  FHT 145   High Risk Pregnancy Diagnosis(es):   Class A2 DM, CHTN  [id number] [redacted]w[redacted]d Estimated Date of Delivery: 12/25/16  Blood pressure 140/80, pulse 88, weight 229 lb (103.9 kg), last menstrual period 03/20/2016.  Urinalysis: Positive for trace protein   HPI: The patient is being seen today for ongoing management of as anove. Today she reports no emesis, pt informed of normal gallbladder sonogram   BP weight and urine results all reviewed and noted. Patient reports good fetal movement, denies any bleeding and no rupture of membranes symptoms or regular contractions.  Fundal Height:  38 Fetal Heart rate:  145 Edema:  none  Patient is without complaints other than noted in her HPI. All questions were answered.  All lab and sonogram results have been reviewed. Comments:  Gallbladder scan from the hospital is reviewed and is normal  Assessment:  1.  Pregnancy at [redacted]w[redacted]d,  Estimated Date of Delivery: 12/25/16 :                          2.  Class A2 DM                        3.  CHTN  Medication(s) Plans:  Glyburide 2.5 BID, procardia xl 30 daily  Treatment Plan:  Twice weekly surveillance, NST alternating with sonogram, plan C section, repeat at 39 weeks unless clincially indicated otherwise  No Follow-up on file. for appointment for high risk OB care  No orders of the defined types were placed in this encounter.  Orders Placed This Encounter  Procedures  . POCT urinalysis dipstick

## 2016-11-08 ENCOUNTER — Ambulatory Visit (INDEPENDENT_AMBULATORY_CARE_PROVIDER_SITE_OTHER): Payer: Managed Care, Other (non HMO)

## 2016-11-08 ENCOUNTER — Ambulatory Visit (INDEPENDENT_AMBULATORY_CARE_PROVIDER_SITE_OTHER): Payer: Managed Care, Other (non HMO) | Admitting: Obstetrics & Gynecology

## 2016-11-08 ENCOUNTER — Encounter: Payer: Self-pay | Admitting: Obstetrics & Gynecology

## 2016-11-08 VITALS — BP 142/86 | HR 89 | Wt 232.0 lb

## 2016-11-08 DIAGNOSIS — O0990 Supervision of high risk pregnancy, unspecified, unspecified trimester: Secondary | ICD-10-CM

## 2016-11-08 DIAGNOSIS — O10919 Unspecified pre-existing hypertension complicating pregnancy, unspecified trimester: Secondary | ICD-10-CM

## 2016-11-08 DIAGNOSIS — O24419 Gestational diabetes mellitus in pregnancy, unspecified control: Secondary | ICD-10-CM

## 2016-11-08 DIAGNOSIS — Z1389 Encounter for screening for other disorder: Secondary | ICD-10-CM

## 2016-11-08 DIAGNOSIS — Z331 Pregnant state, incidental: Secondary | ICD-10-CM

## 2016-11-08 LAB — POCT URINALYSIS DIPSTICK
Blood, UA: NEGATIVE
Glucose, UA: NEGATIVE
Ketones, UA: NEGATIVE
Leukocytes, UA: NEGATIVE
Nitrite, UA: NEGATIVE
Protein, UA: NEGATIVE

## 2016-11-08 NOTE — Progress Notes (Signed)
Fetal Surveillance Testing today:  BPP 8/8 with good Doppler flow   High Risk Pregnancy Diagnosis(es):   A2 DM, CHTN  [id number] [redacted]w[redacted]d Estimated Date of Delivery: 12/25/16  Blood pressure (!) 142/86, pulse 89, weight 232 lb (105.2 kg), last menstrual period 03/20/2016.  Urinalysis: Negative   HPI: The patient is being seen today for ongoing management of as above. Today she reports no RUQ pain, no emesis, CBG overall ok, no med changes needed   BP weight and urine results all reviewed and noted. Patient reports good fetal movement, denies any bleeding and no rupture of membranes symptoms or regular contractions.  Fundal Height:  39 Fetal Heart rate:  136 Edema:  1+  Patient is without complaints other than noted in her HPI. All questions were answered.  All lab and sonogram results have been reviewed. Comments:    Assessment:  1.  Pregnancy at [redacted]w[redacted]d,  Estimated Date of Delivery: 12/25/16 :                          2.  Class A2 DM                        3.  CHTN                         4.  Prev C/S x2  Medication(s) Plans:  Glyburide 2.5 BID, procardia xl 30 qd  Treatment Plan:  Twice weekly surveillance, sonogram alternating with NST, repeat section at 39 weeks or as clinically indicated, declines BTL   Return in about 3 days (around 11/11/2016) for NST, HROB, with Dr Elonda Husky. for appointment for high risk OB care  No orders of the defined types were placed in this encounter.  Orders Placed This Encounter  Procedures  . POCT urinalysis dipstick

## 2016-11-08 NOTE — Progress Notes (Signed)
[place] 83+4 wks,cephalic,post pl gr 1,bilat adnexa's wnl,BPP 8/8,AFI 7.2 cm,fhr 148 bpm,RI .60,.70 89%

## 2016-11-10 ENCOUNTER — Encounter: Payer: Self-pay | Admitting: Obstetrics & Gynecology

## 2016-11-10 ENCOUNTER — Ambulatory Visit (INDEPENDENT_AMBULATORY_CARE_PROVIDER_SITE_OTHER): Payer: Managed Care, Other (non HMO) | Admitting: Obstetrics & Gynecology

## 2016-11-10 VITALS — BP 140/80 | Wt 233.4 lb

## 2016-11-10 DIAGNOSIS — O24419 Gestational diabetes mellitus in pregnancy, unspecified control: Secondary | ICD-10-CM | POA: Diagnosis not present

## 2016-11-10 DIAGNOSIS — O0990 Supervision of high risk pregnancy, unspecified, unspecified trimester: Secondary | ICD-10-CM | POA: Diagnosis not present

## 2016-11-10 DIAGNOSIS — Z331 Pregnant state, incidental: Secondary | ICD-10-CM

## 2016-11-10 DIAGNOSIS — Z1389 Encounter for screening for other disorder: Secondary | ICD-10-CM

## 2016-11-10 DIAGNOSIS — O10919 Unspecified pre-existing hypertension complicating pregnancy, unspecified trimester: Secondary | ICD-10-CM | POA: Diagnosis not present

## 2016-11-10 LAB — POCT URINALYSIS DIPSTICK
Blood, UA: NEGATIVE
Glucose, UA: NEGATIVE
Ketones, UA: NEGATIVE
Leukocytes, UA: NEGATIVE
Nitrite, UA: NEGATIVE
Protein, UA: NEGATIVE

## 2016-11-10 MED ORDER — GLYBURIDE 2.5 MG PO TABS: ORAL_TABLET | ORAL | 3 refills | Status: DC

## 2016-11-10 NOTE — Progress Notes (Signed)
Fetal Surveillance Testing today:  Reactive NST   High Risk Pregnancy Diagnosis(es):   Class A2 DM, CHTN  [id number] [redacted]w[redacted]d Estimated Date of Delivery: 12/25/16  Blood pressure 140/80, weight 233 lb 6.4 oz (105.9 kg), last menstrual period 03/20/2016.  Urinalysis: Negative   HPI: The patient is being seen today for ongoing management of as above. Today she reports CBG are decent a bit suboptimal   BP weight and urine results all reviewed and noted. Patient reports good fetal movement, denies any bleeding and no rupture of membranes symptoms or regular contractions.  Fundal Height:  38 Fetal Heart rate:  135 Edema:  none  Patient is without complaints other than noted in her HPI. All questions were answered.  All lab and sonogram results have been reviewed. Comments:    Assessment:  1.  Pregnancy at [redacted]w[redacted]d,  Estimated Date of Delivery: 12/25/16 :                          2.  Class A2 DM                        3.  CHTN  Medication(s) Plans:  Increase glyburide 2.5 in am, 5 pm, nifedipine 30 daily  Treatment Plan:  Twice weekly surveillance, sonogram alternating with NST, delivery by repeat section at 39 weeks or as clinically indicated   Return in about 4 days (around 11/14/2016) for BPP/sono, HROB. for appointment for high risk OB care  Meds ordered this encounter  Medications  . glyBURIDE (DIABETA) 2.5 MG tablet    Sig: 1 in am and 2 in the evning    Dispense:  90 tablet    Refill:  3   Orders Placed This Encounter  Procedures  . US Fetal BPP W/O Non Stress  . [place] UA Cord Doppler  . POCT urinalysis dipstick

## 2016-11-14 ENCOUNTER — Ambulatory Visit (INDEPENDENT_AMBULATORY_CARE_PROVIDER_SITE_OTHER): Payer: Managed Care, Other (non HMO)

## 2016-11-14 ENCOUNTER — Encounter: Payer: Self-pay | Admitting: Obstetrics & Gynecology

## 2016-11-14 ENCOUNTER — Ambulatory Visit (INDEPENDENT_AMBULATORY_CARE_PROVIDER_SITE_OTHER): Payer: Managed Care, Other (non HMO) | Admitting: Obstetrics & Gynecology

## 2016-11-14 VITALS — BP 118/70 | HR 100 | Wt 230.0 lb

## 2016-11-14 DIAGNOSIS — O0990 Supervision of high risk pregnancy, unspecified, unspecified trimester: Secondary | ICD-10-CM

## 2016-11-14 DIAGNOSIS — Z331 Pregnant state, incidental: Secondary | ICD-10-CM

## 2016-11-14 DIAGNOSIS — O24419 Gestational diabetes mellitus in pregnancy, unspecified control: Secondary | ICD-10-CM

## 2016-11-14 DIAGNOSIS — Z1389 Encounter for screening for other disorder: Secondary | ICD-10-CM

## 2016-11-14 DIAGNOSIS — O10919 Unspecified pre-existing hypertension complicating pregnancy, unspecified trimester: Secondary | ICD-10-CM

## 2016-11-14 LAB — POCT URINALYSIS DIPSTICK
Blood, UA: NEGATIVE
Glucose, UA: NEGATIVE
Ketones, UA: NEGATIVE
Leukocytes, UA: NEGATIVE
Nitrite, UA: NEGATIVE
Protein, UA: NEGATIVE

## 2016-11-14 NOTE — Progress Notes (Signed)
Fetal Surveillance Testing today:  BPP 8/8 with good Doppler flow   High Risk Pregnancy Diagnosis(es):   Class A2 DM, CHTN  [id number] [redacted]w[redacted]d Estimated Date of Delivery: 12/25/16  Blood pressure 118/70, pulse 100, weight 230 lb (104.3 kg), last menstrual period 03/20/2016.  Urinalysis: Negative   HPI: The patient is being seen today for ongoing management of as above. Today she reports CBG are good today   BP weight and urine results all reviewed and noted. Patient reports good fetal movement, denies any bleeding and no rupture of membranes symptoms or regular contractions.  Fundal Height:  146 Fetal Heart rate:  37 Edema:  none  Patient is without complaints other than noted in her HPI. All questions were answered.  All lab and sonogram results have been reviewed. Comments:    Assessment:  1.  Pregnancy at [redacted]w[redacted]d,  Estimated Date of Delivery: 12/25/16 :                          2.  Class A2 DM                        3.  CHTN  Medication(s) Plans:  Glyburide 2.5 mg BID, procardia xl 30 daily(no changes)  Treatment Plan:  Twice weekly surveillance, sonogram alternating with NST, delivery at 39 weeks or as clinically indicated, C section scheduled 12/19/2016   Return in about 3 days (around 11/17/2016) for NST, HROB. for appointment for high risk OB care  No orders of the defined types were placed in this encounter.  Orders Placed This Encounter  Procedures  . POCT urinalysis dipstick

## 2016-11-14 NOTE — Progress Notes (Signed)
[place] 34+1 wks,BPP 8/8,cephalic,post pl gr 2,bilat adnexa's wnl,RI .57,.70 71%,FHR 162 BPM

## 2016-11-17 ENCOUNTER — Encounter: Payer: Self-pay | Admitting: Obstetrics and Gynecology

## 2016-11-17 ENCOUNTER — Ambulatory Visit (INDEPENDENT_AMBULATORY_CARE_PROVIDER_SITE_OTHER): Payer: Managed Care, Other (non HMO) | Admitting: Obstetrics and Gynecology

## 2016-11-17 VITALS — BP 130/72 | HR 99 | Wt 234.0 lb

## 2016-11-17 DIAGNOSIS — O24410 Gestational diabetes mellitus in pregnancy, diet controlled: Secondary | ICD-10-CM | POA: Diagnosis not present

## 2016-11-17 DIAGNOSIS — O10919 Unspecified pre-existing hypertension complicating pregnancy, unspecified trimester: Secondary | ICD-10-CM | POA: Diagnosis not present

## 2016-11-17 DIAGNOSIS — Z1389 Encounter for screening for other disorder: Secondary | ICD-10-CM | POA: Diagnosis not present

## 2016-11-17 DIAGNOSIS — Z331 Pregnant state, incidental: Secondary | ICD-10-CM | POA: Diagnosis not present

## 2016-11-17 DIAGNOSIS — O0990 Supervision of high risk pregnancy, unspecified, unspecified trimester: Secondary | ICD-10-CM

## 2016-11-17 LAB — POCT URINALYSIS DIPSTICK
Blood, UA: NEGATIVE
Glucose, UA: NEGATIVE
Ketones, UA: NEGATIVE
Leukocytes, UA: NEGATIVE
Nitrite, UA: NEGATIVE
Protein, UA: NEGATIVE

## 2016-11-17 NOTE — Progress Notes (Signed)
High Risk Pregnancy HROB Diagnosis(es):   Class A2 DM and CHTN  [id number] [redacted]w[redacted]d Estimated Date of Delivery: 12/25/16    HPI: The patient is being seen today for ongoing management of above. Today she reports poor CBGs recently. Fasting 76-96, post prandial 174-201. 50% of day time cbg's are high.  Patient reports good fetal movement, denies any bleeding and no rupture of membranes symptoms or regular contractions.   BP weight and urine results reviewed and noted. Blood pressure 130/72, pulse 99, weight 234 lb (106.1 kg), last menstrual period 03/20/2016.  Fundal Height:  38 Fetal Heart rate:  150  Physical Examination: Abdomen - soft, nontender, nondistended, no masses or organomegaly                                     Pelvic - examination not indicated                                     Edema:  Trace   Urinalysis:NEGATIVE   Fetal Surveillance Testing today:  NST, reactive  Lab and sonogram results have been reviewed. Comments:    Assessment:  1.  Pregnancy at 108w4d,  [id number]   :  Estimated Date of Delivery: 12/25/16                         2.  Class A2 DM                        3. CHTN             4. Counseled on diet and weight control   Medication(s) Plans:  Glyburide 2.5 mg in AM, 5 mg in PM -->CHANGE TO  5mg  BID, procardia xl 30 daily (no changes)  Treatment Plan:  Twice weekly surveillance, sonogram alternating with NST, deliver at 39 weeks or as clinically indicated, C section scheduled 12/19/16  Follow up in 5 days for appointment for high risk OB care, BPP  By signing my name below, I, Sonum Patel, attest that this documentation has been prepared under the direction and in the presence of Jonnie Kind, MD. Electronically Signed: Sonum Patel, Scribe. 11/17/16. 3:53 PM.  I personally performed the services described in this documentation, which was SCRIBED in my presence. The recorded information has been reviewed and considered accurate. It has been edited as necessary  during review. Jonnie Kind, MD

## 2016-11-21 ENCOUNTER — Other Ambulatory Visit: Payer: Self-pay | Admitting: Obstetrics and Gynecology

## 2016-11-21 DIAGNOSIS — I10 Essential (primary) hypertension: Secondary | ICD-10-CM

## 2016-11-21 DIAGNOSIS — O24419 Gestational diabetes mellitus in pregnancy, unspecified control: Secondary | ICD-10-CM

## 2016-11-21 DIAGNOSIS — O24410 Gestational diabetes mellitus in pregnancy, diet controlled: Secondary | ICD-10-CM

## 2016-11-22 ENCOUNTER — Encounter: Payer: Self-pay | Admitting: Obstetrics & Gynecology

## 2016-11-22 ENCOUNTER — Ambulatory Visit (INDEPENDENT_AMBULATORY_CARE_PROVIDER_SITE_OTHER): Payer: Managed Care, Other (non HMO)

## 2016-11-22 ENCOUNTER — Ambulatory Visit (INDEPENDENT_AMBULATORY_CARE_PROVIDER_SITE_OTHER): Payer: Managed Care, Other (non HMO) | Admitting: Obstetrics & Gynecology

## 2016-11-22 VITALS — BP 132/84 | HR 89 | Wt 234.0 lb

## 2016-11-22 DIAGNOSIS — Z1389 Encounter for screening for other disorder: Secondary | ICD-10-CM

## 2016-11-22 DIAGNOSIS — I10 Essential (primary) hypertension: Secondary | ICD-10-CM | POA: Diagnosis not present

## 2016-11-22 DIAGNOSIS — O24419 Gestational diabetes mellitus in pregnancy, unspecified control: Secondary | ICD-10-CM | POA: Diagnosis not present

## 2016-11-22 DIAGNOSIS — Z331 Pregnant state, incidental: Secondary | ICD-10-CM

## 2016-11-22 DIAGNOSIS — O10919 Unspecified pre-existing hypertension complicating pregnancy, unspecified trimester: Secondary | ICD-10-CM

## 2016-11-22 DIAGNOSIS — O0990 Supervision of high risk pregnancy, unspecified, unspecified trimester: Secondary | ICD-10-CM

## 2016-11-22 LAB — POCT URINALYSIS DIPSTICK
Blood, UA: NEGATIVE
Glucose, UA: NEGATIVE
Ketones, UA: NEGATIVE
Leukocytes, UA: NEGATIVE
Nitrite, UA: NEGATIVE
Protein, UA: NEGATIVE

## 2016-11-22 NOTE — Progress Notes (Signed)
[place] 19+5 wks,cephalic,fhr 974 bpm,BPP 7/1,EZBM pl gr 2,bilat adnexa's wnl,afi 10 cm,RI .62,.68 82%,EFW 3047 g 74%

## 2016-11-22 NOTE — Progress Notes (Signed)
Fetal Surveillance Testing today:  BPP 8/8, Doppler flow excellent diastolic flow   High Risk Pregnancy Diagnosis(es):   Class A2, CHTN  [id number] [redacted]w[redacted]d Estimated Date of Delivery: 12/25/16  Blood pressure 132/84, pulse 89, weight 234 lb (106.1 kg), last menstrual period 03/20/2016.  Urinalysis: Negative   HPI: The patient is being seen today for ongoing management of as above. Today she reports cbg are much better since her glyburide was increased   BP weight and urine results all reviewed and noted. Patient reports good fetal movement, denies any bleeding and no rupture of membranes symptoms or regular contractions.  Fundal Height:  37 Fetal Heart rate:  146 Edema:  1+  Patient is without complaints other than noted in her HPI. All questions were answered.  All lab and sonogram results have been reviewed. Comments:    Assessment:  1.  Pregnancy at [redacted]w[redacted]d,  Estimated Date of Delivery: 12/25/16 :                          2.  Class A2 DM                        3.  CHTN  Medication(s) Plans:  Continue glyburide 5 BID, nidfedipine 30, baby asa  Treatment Plan:  twcie weekly testing c section 39 weeks  No Follow-up on file. for appointment for high risk OB care  No orders of the defined types were placed in this encounter.  Orders Placed This Encounter  Procedures  . US Fetal BPP W/O Non Stress  . [place] UA Cord Doppler  . POCT urinalysis dipstick

## 2016-11-24 ENCOUNTER — Telehealth: Payer: Self-pay | Admitting: *Deleted

## 2016-11-24 NOTE — Telephone Encounter (Signed)
Spoke with patient. FMLA re faxed.

## 2016-11-25 ENCOUNTER — Other Ambulatory Visit: Payer: Managed Care, Other (non HMO) | Admitting: Obstetrics & Gynecology

## 2016-11-28 ENCOUNTER — Encounter: Payer: Self-pay | Admitting: Obstetrics and Gynecology

## 2016-11-28 ENCOUNTER — Ambulatory Visit (INDEPENDENT_AMBULATORY_CARE_PROVIDER_SITE_OTHER): Payer: Managed Care, Other (non HMO) | Admitting: Obstetrics and Gynecology

## 2016-11-28 VITALS — BP 140/70 | HR 91 | Wt 232.2 lb

## 2016-11-28 DIAGNOSIS — O34219 Maternal care for unspecified type scar from previous cesarean delivery: Secondary | ICD-10-CM

## 2016-11-28 DIAGNOSIS — Z331 Pregnant state, incidental: Secondary | ICD-10-CM

## 2016-11-28 DIAGNOSIS — Z1389 Encounter for screening for other disorder: Secondary | ICD-10-CM

## 2016-11-28 DIAGNOSIS — O24419 Gestational diabetes mellitus in pregnancy, unspecified control: Secondary | ICD-10-CM

## 2016-11-28 DIAGNOSIS — O0990 Supervision of high risk pregnancy, unspecified, unspecified trimester: Secondary | ICD-10-CM

## 2016-11-28 LAB — POCT URINALYSIS DIPSTICK
Blood, UA: NEGATIVE
Glucose, UA: NEGATIVE
Ketones, UA: NEGATIVE
Leukocytes, UA: NEGATIVE
Nitrite, UA: NEGATIVE
Protein, UA: NEGATIVE

## 2016-11-28 NOTE — Progress Notes (Signed)
Patient ID: Tina Wall, female   DOB: Jun 09, 1981, 36 y.o.   MRN: [id number]   High Risk Pregnancy HROB Diagnosis(es):   Class A2 DM, CHTN  [id number] [redacted]w[redacted]d Estimated Date of Delivery: 12/25/16     HPI: The patient is being seen today for ongoing management of the above.  Chief Complaint  Patient presents with  . High Risk Gestation    NST  ____  Today she reports she is doing well. She states she has been walking daily.  This has improved her sense of wellbeing and improved diabetic control per pt.  Patient reports good fetal movement. She denies any bleeding and no rupture of membranes symptoms or regular contractions.  BP weight and urine results reviewed and noted. Blood pressure 140/70, pulse 91, weight 232 lb 3.2 oz (105.3 kg), last menstrual period 03/20/2016.  Fundal Height:  41 cm Fetal Heart rate:  150 bpm with accels to 170 Physical Examination: Abdomen - soft, nontender, nondistended, no masses or organomegaly                                     Edema:  none  Urinalysis:NEGATIVE for all  Fetal Surveillance Testing today:  NST reactive   Lab and sonogram results have been reviewed.  Generally good control on current meds.  Assessment:  1.  Pregnancy at [redacted]w[redacted]d,  [id number]   :  Estimated Date of Delivery: 12/25/16                         2.  Class A2 DM - CBGs per home log WNL                        3. CHTN  Medication(s) Plans:  Continue glyburide 5 BID, nidfedipine 30, baby asa  Treatment Plan:   Twice weekly testing  Repeat C-section 39 weeks  Follow up in 3 days for appointment for high risk OB care, twice weekly testing   By signing my name below, I, Hansel Feinstein, attest that this documentation has been prepared under the direction and in the presence of Jonnie Kind, MD. Electronically Signed: Hansel Feinstein, ED Scribe. 11/28/16. 3:22 PM.  I personally performed the services described in this documentation, which was SCRIBED in my presence. The recorded  information has been reviewed and considered accurate. It has been edited as necessary during review. Jonnie Kind, MD

## 2016-11-29 ENCOUNTER — Encounter: Payer: Managed Care, Other (non HMO) | Admitting: Obstetrics & Gynecology

## 2016-11-29 ENCOUNTER — Other Ambulatory Visit: Payer: Managed Care, Other (non HMO)

## 2016-11-30 ENCOUNTER — Telehealth: Payer: Self-pay | Admitting: *Deleted

## 2016-11-30 NOTE — Telephone Encounter (Signed)
Patient called with complaints of dark brown spotting with a mucous consistency only when she uses the bathroom. She is having normal discharge with no contractions, cramping or leaking. Baby is moving normally. Advised patient that she could have passed her mucous plug but to continue to monitor the bleeding. If it becomes heavier, bright red , baby decreases movement or thinks her water has broken to call us back. Offered patient an appointment to be seen to r/o rupture but patient declined.

## 2016-12-02 ENCOUNTER — Ambulatory Visit (INDEPENDENT_AMBULATORY_CARE_PROVIDER_SITE_OTHER): Payer: Managed Care, Other (non HMO)

## 2016-12-02 ENCOUNTER — Encounter: Payer: Self-pay | Admitting: Women's Health

## 2016-12-02 ENCOUNTER — Ambulatory Visit (INDEPENDENT_AMBULATORY_CARE_PROVIDER_SITE_OTHER): Payer: Managed Care, Other (non HMO) | Admitting: Women's Health

## 2016-12-02 ENCOUNTER — Encounter (HOSPITAL_COMMUNITY): Payer: Self-pay | Admitting: *Deleted

## 2016-12-02 ENCOUNTER — Inpatient Hospital Stay (HOSPITAL_COMMUNITY)
Admission: AD | Admit: 2016-12-02 | Discharge: 2016-12-02 | Disposition: A | Discharge status: Home / Self Care | Payer: Managed Care, Other (non HMO) | Source: Ambulatory Visit | Attending: Obstetrics and Gynecology | Admitting: Obstetrics and Gynecology

## 2016-12-02 ENCOUNTER — Telehealth: Payer: Self-pay | Admitting: *Deleted

## 2016-12-02 VITALS — BP 130/76 | HR 94 | Wt 234.0 lb

## 2016-12-02 DIAGNOSIS — Z79899 Other long term (current) drug therapy: Secondary | ICD-10-CM | POA: Diagnosis not present

## 2016-12-02 DIAGNOSIS — O163 Unspecified maternal hypertension, third trimester: Secondary | ICD-10-CM | POA: Insufficient documentation

## 2016-12-02 DIAGNOSIS — N898 Other specified noninflammatory disorders of vagina: Secondary | ICD-10-CM | POA: Diagnosis not present

## 2016-12-02 DIAGNOSIS — Z3A36 36 weeks gestation of pregnancy: Secondary | ICD-10-CM | POA: Insufficient documentation

## 2016-12-02 DIAGNOSIS — E119 Type 2 diabetes mellitus without complications: Secondary | ICD-10-CM | POA: Diagnosis not present

## 2016-12-02 DIAGNOSIS — O10913 Unspecified pre-existing hypertension complicating pregnancy, third trimester: Secondary | ICD-10-CM

## 2016-12-02 DIAGNOSIS — Z7982 Long term (current) use of aspirin: Secondary | ICD-10-CM | POA: Insufficient documentation

## 2016-12-02 DIAGNOSIS — Z1389 Encounter for screening for other disorder: Secondary | ICD-10-CM

## 2016-12-02 DIAGNOSIS — J45909 Unspecified asthma, uncomplicated: Secondary | ICD-10-CM | POA: Diagnosis not present

## 2016-12-02 DIAGNOSIS — Z7984 Long term (current) use of oral hypoglycemic drugs: Secondary | ICD-10-CM | POA: Insufficient documentation

## 2016-12-02 DIAGNOSIS — O0990 Supervision of high risk pregnancy, unspecified, unspecified trimester: Secondary | ICD-10-CM

## 2016-12-02 DIAGNOSIS — O24419 Gestational diabetes mellitus in pregnancy, unspecified control: Secondary | ICD-10-CM

## 2016-12-02 DIAGNOSIS — Z331 Pregnant state, incidental: Secondary | ICD-10-CM

## 2016-12-02 DIAGNOSIS — O10919 Unspecified pre-existing hypertension complicating pregnancy, unspecified trimester: Secondary | ICD-10-CM

## 2016-12-02 DIAGNOSIS — O24113 Pre-existing diabetes mellitus, type 2, in pregnancy, third trimester: Secondary | ICD-10-CM | POA: Diagnosis not present

## 2016-12-02 DIAGNOSIS — Z3483 Encounter for supervision of other normal pregnancy, third trimester: Secondary | ICD-10-CM

## 2016-12-02 DIAGNOSIS — O99513 Diseases of the respiratory system complicating pregnancy, third trimester: Secondary | ICD-10-CM | POA: Insufficient documentation

## 2016-12-02 DIAGNOSIS — B009 Herpesviral infection, unspecified: Secondary | ICD-10-CM

## 2016-12-02 DIAGNOSIS — O0993 Supervision of high risk pregnancy, unspecified, third trimester: Secondary | ICD-10-CM

## 2016-12-02 DIAGNOSIS — O26893 Other specified pregnancy related conditions, third trimester: Secondary | ICD-10-CM | POA: Insufficient documentation

## 2016-12-02 DIAGNOSIS — R109 Unspecified abdominal pain: Secondary | ICD-10-CM | POA: Insufficient documentation

## 2016-12-02 HISTORY — DX: Unspecified ovarian cyst, unspecified side: N83.209

## 2016-12-02 HISTORY — DX: Headache: R51

## 2016-12-02 HISTORY — DX: Syphilis, unspecified: A53.9

## 2016-12-02 HISTORY — DX: Headache, unspecified: R51.9

## 2016-12-02 LAB — POCT URINALYSIS DIPSTICK
Blood, UA: NEGATIVE
Glucose, UA: NEGATIVE
Ketones, UA: NEGATIVE
Leukocytes, UA: NEGATIVE
Nitrite, UA: NEGATIVE
Protein, UA: NEGATIVE

## 2016-12-02 LAB — COMPREHENSIVE METABOLIC PANEL
ALT: 13 U/L — ABNORMAL LOW (ref 14–54)
AST: 19 U/L (ref 15–41)
Albumin: 3.2 g/dL — ABNORMAL LOW (ref 3.5–5.0)
Alkaline Phosphatase: 101 U/L (ref 38–126)
Anion gap: 9 (ref 5–15)
BUN: 5 mg/dL — ABNORMAL LOW (ref 6–20)
CO2: 21 mmol/L — ABNORMAL LOW (ref 22–32)
Calcium: 8.7 mg/dL — ABNORMAL LOW (ref 8.9–10.3)
Chloride: 106 mmol/L (ref 101–111)
Creatinine, Ser: 0.62 mg/dL (ref 0.44–1.00)
GFR calc Af Amer: >60 mL/min (ref >60)
GFR calc non Af Amer: >60 mL/min (ref >60)
Glucose, Bld: 79 mg/dL (ref 65–99)
Potassium: 3.4 mmol/L — ABNORMAL LOW (ref 3.5–5.1)
Sodium: 136 mmol/L (ref 135–145)
Total Bilirubin: 0.5 mg/dL (ref 0.3–1.2)
Total Protein: 7.3 g/dL (ref 6.5–8.1)

## 2016-12-02 LAB — URINALYSIS, ROUTINE W REFLEX MICROSCOPIC
Bilirubin Urine: NEGATIVE
Glucose, UA: NEGATIVE mg/dL
Hgb urine dipstick: NEGATIVE
Ketones, ur: NEGATIVE mg/dL
Leukocytes, UA: NEGATIVE
Nitrite: NEGATIVE
Protein, ur: NEGATIVE mg/dL
Specific Gravity, Urine: 1.004 — ABNORMAL LOW (ref 1.005–1.030)
pH: 6 (ref 5.0–8.0)

## 2016-12-02 LAB — PROTEIN / CREATININE RATIO, URINE
Creatinine, Urine: 66 mg/dL
Total Protein, Urine: <6 mg/dL

## 2016-12-02 LAB — CBC
HCT: 34.4 % — ABNORMAL LOW (ref 36.0–46.0)
Hemoglobin: 11.3 g/dL — ABNORMAL LOW (ref 12.0–15.0)
MCH: 30.8 pg (ref 26.0–34.0)
MCHC: 32.8 g/dL (ref 30.0–36.0)
MCV: 93.7 fL (ref 78.0–100.0)
Platelets: 182 10*3/uL (ref 150–400)
RBC: 3.67 MIL/uL — ABNORMAL LOW (ref 3.87–5.11)
RDW: 14.5 % (ref 11.5–15.5)
WBC: 4.4 10*3/uL (ref 4.0–10.5)

## 2016-12-02 MED ORDER — ACYCLOVIR 400 MG PO TABS: 400.0000 mg | ORAL_TABLET | Freq: Three times a day (TID) | ORAL | 3 refills | Status: DC

## 2016-12-02 NOTE — Discharge Instructions (Signed)
Braxton Hicks Contractions Contractions of the uterus can occur throughout pregnancy, but they are not always a sign that you are in labor. You may have practice contractions called Braxton Hicks contractions. These false labor contractions are sometimes confused with true labor. What are [place] contractions? Braxton Hicks contractions are tightening movements that occur in the muscles of the uterus before labor. Unlike true labor contractions, these contractions do not result in opening (dilation) and thinning of the cervix. Toward the end of pregnancy (32-34 weeks), Braxton Hicks contractions can happen more often and may become stronger. These contractions are sometimes difficult to tell apart from true labor because they can be very uncomfortable. You should not feel embarrassed if you go to the hospital with false labor. Sometimes, the only way to tell if you are in true labor is for your health care provider to look for changes in the cervix. The health care provider will do a physical exam and may monitor your contractions. If you are not in true labor, the exam should show that your cervix is not dilating and your water has not broken. If there are no prenatal problems or other health problems associated with your pregnancy, it is completely safe for you to be sent home with false labor. You may continue to have Braxton Hicks contractions until you go into true labor. How can I tell the difference between true labor and false labor?  Differences  False labor  Contractions last 30-70 seconds.: Contractions are usually shorter and not as strong as true labor contractions.  Contractions become very regular.: Contractions are usually irregular.  Discomfort is usually felt in the top of the uterus, and it spreads to the lower abdomen and low back.: Contractions are often felt in the front of the lower abdomen and in the groin.  Contractions do not go away with walking.: Contractions may  go away when you walk around or change positions while lying down.  Contractions usually become more intense and increase in frequency.: Contractions get weaker and are shorter-lasting as time goes on.  The cervix dilates and gets thinner.: The cervix usually does not dilate or become thin. Follow these instructions at home:  Take over-the-counter and prescription medicines only as told by your health care provider.  Keep up with your usual exercises and follow other instructions from your health care provider.  Eat and drink lightly if you think you are going into labor.  If Braxton Hicks contractions are making you uncomfortable:  Change your position from lying down or resting to walking, or change from walking to resting.  Sit and rest in a tub of warm water.  Drink enough fluid to keep your urine clear or pale yellow. Dehydration may cause these contractions.  Do slow and deep breathing several times an hour.  Keep all follow-up prenatal visits as told by your health care provider. This is important. Contact a health care provider if:  You have a fever.  You have continuous pain in your abdomen. Get help right away if:  Your contractions become stronger, more regular, and closer together.  You have fluid leaking or gushing from your vagina.  You pass blood-tinged mucus (bloody show).  You have bleeding from your vagina.  You have low back pain that you never had before.  You feel your babys head pushing down and causing pelvic pressure.  Your baby is not moving inside you as much as it used to. Summary  Contractions that occur before labor are  called Braxton Hicks contractions, false labor, or practice contractions.  Braxton Hicks contractions are usually shorter, weaker, farther apart, and less regular than true labor contractions. True labor contractions usually become progressively stronger and regular and they become more frequent.  Manage discomfort from  [employer] contractions by changing position, resting in a warm bath, drinking plenty of water, or practicing deep breathing. This information is not intended to replace advice given to you by your health care provider. Make sure you discuss any questions you have with your health care provider. Document Released: 08/08/2005 Document Revised: 06/27/2016 Document Reviewed: 06/27/2016 Elsevier Interactive Patient Education  2017 [employer]. Hypertension During Pregnancy Hypertension, commonly called high blood pressure, is when the force of blood pumping through your arteries is too strong. Arteries are blood vessels that carry blood from the heart throughout the body. Hypertension during pregnancy can cause problems for you and your baby. Your baby may be born early (prematurely) or may not weigh as much as he or she should at birth. Very bad cases of hypertension during pregnancy can be life-threatening. Different types of hypertension can occur during pregnancy. These include:  Chronic hypertension. This happens when:  You have hypertension before pregnancy and it continues during pregnancy.  You develop hypertension before you are [redacted] weeks pregnant, and it continues during pregnancy.  Gestational hypertension. This is hypertension that develops after the 20th week of pregnancy.  Preeclampsia, also called toxemia of pregnancy. This is a very serious type of hypertension that develops only during pregnancy. It affects the whole body, and it can be very dangerous for you and your baby. Gestational hypertension and preeclampsia usually go away within 6 weeks after your baby is born. Women who have hypertension during pregnancy have a greater chance of developing hypertension later in life or during future pregnancies. What are the causes? The exact cause of hypertension is not known. What increases the risk? There are certain factors that make it more likely for you to develop hypertension  during pregnancy. These include:  Having hypertension during a previous pregnancy or prior to pregnancy.  Being overweight.  Being older than age 25.  Being pregnant for the first time or being pregnant with more than one baby.  Becoming pregnant using fertilization methods such as IVF (in vitro fertilization).  Having diabetes, kidney problems, or systemic lupus erythematosus.  Having a family history of hypertension. What are the signs or symptoms? Chronic hypertension and gestational hypertension rarely cause symptoms. Preeclampsia causes symptoms, which may include:  Increased protein in your urine. Your health care provider will check for this at every visit before you give birth (prenatal visit).  Severe headaches.  Sudden weight gain.  Swelling of the hands, face, legs, and feet.  Nausea and vomiting.  Vision problems, such as blurred or double vision.  Numbness in the face, arms, legs, and feet.  Dizziness.  Slurred speech.  Sensitivity to bright lights.  Abdominal pain.  Convulsions. How is this diagnosed? You may be diagnosed with hypertension during a routine prenatal exam. At each prenatal visit, you may:  Have a urine test to check for high amounts of protein in your urine.  Have your blood pressure checked. A blood pressure reading is recorded as two numbers, such as "120 over 80" (or 120/80). The first ("top") number is called the systolic pressure. It is a measure of the pressure in your arteries when your heart beats. The second ("bottom") number is called the diastolic pressure. It is a  measure of the pressure in your arteries as your heart relaxes between beats. Blood pressure is measured in a unit called mm Hg. A normal blood pressure reading is:  Systolic: below 630.  Diastolic: below 80. The type of hypertension that you are diagnosed with depends on your test results and when your symptoms developed.  Chronic hypertension is usually  diagnosed before 20 weeks of pregnancy.  Gestational hypertension is usually diagnosed after 20 weeks of pregnancy.  Hypertension with high amounts of protein in the urine is diagnosed as preeclampsia.  Blood pressure measurements that stay above 160 systolic, or above 109 diastolic, are signs of severe preeclampsia. How is this treated? Treatment for hypertension during pregnancy varies depending on the type of hypertension you have and how serious it is.  If you take medicines called ACE inhibitors to treat chronic hypertension, you may need to switch medicines. ACE inhibitors should not be taken during pregnancy.  If you have gestational hypertension, you may need to take blood pressure medicine.  If you are at risk for preeclampsia, your health care provider may recommend that you take a low-dose aspirin every day to prevent high blood pressure during your pregnancy.  If you have severe preeclampsia, you may need to be hospitalized so you and your baby can be monitored closely. You may also need to take medicine (magnesium sulfate) to prevent seizures and to lower blood pressure. This medicine may be given as an injection or through an IV tube.  In some cases, if your condition gets worse, you may need to deliver your baby early. Follow these instructions at home: Eating and drinking   Drink enough fluid to keep your urine clear or pale yellow.  Eat a healthy diet that is low in salt (sodium). Do not add salt to your food. Check food labels to see how much sodium a food or beverage contains. Lifestyle   Do not use any products that contain nicotine or tobacco, such as cigarettes and e-cigarettes. If you need help quitting, ask your health care provider.  Do not use alcohol.  Avoid caffeine.  Avoid stress as much as possible. Rest and get plenty of sleep. General instructions   Take over-the-counter and prescription medicines only as told by your health care provider.  While  lying down, lie on your left side. This keeps pressure off your baby.  While sitting or lying down, raise (elevate) your feet. Try putting some pillows under your lower legs.  Exercise regularly. Ask your health care provider what kinds of exercise are best for you.  Keep all prenatal and follow-up visits as told by your health care provider. This is important. Contact a health care provider if:  You have symptoms that your health care provider told you may require more treatment or monitoring, such as:  Fever.  Vomiting.  Headache. Get help right away if:  You have severe abdominal pain or vomiting that does not get better with treatment.  You suddenly develop swelling in your hands, ankles, or face.  You gain 4 lbs (1.8 kg) or more in 1 week.  You develop vaginal bleeding, or you have blood in your urine.  You do not feel your baby moving as much as usual.  You have blurred or double vision.  You have muscle twitching or sudden tightening (spasms).  You have shortness of breath.  Your lips or fingernails turn blue. This information is not intended to replace advice given to you by your health care provider.  Make sure you discuss any questions you have with your health care provider. Document Released: 04/26/2011 Document Revised: 02/26/2016 Document Reviewed: 01/22/2016 Elsevier Interactive Patient Education  2017 [employer].

## 2016-12-02 NOTE — MAU Note (Signed)
Woke up with pain in lower right side, down in to leg and lower back.  Pain is constant. Was sleeping on that side.  Had some brownish d/c  Started on Monday- had mucous with it then, Wed changed to yellow, is watery

## 2016-12-02 NOTE — Progress Notes (Signed)
[place] 26+2 wks,cephalic,BPP 0/3,TDH 741 bpm,post pl gr 2,afi 9.6 cm,RI .57,.61 75%

## 2016-12-02 NOTE — Patient Instructions (Signed)
Call the office [phone]) or go to [facility] if:  You begin to have strong, frequent contractions  Your water breaks.  Sometimes it is a big gush of fluid, sometimes it is just a trickle that keeps getting your panties wet or running down your legs  You have vaginal bleeding.  It is normal to have a small amount of spotting if your cervix was checked.   You don't feel your baby moving like normal.  If you don't, get you something to eat and drink and lay down and focus on feeling your baby move.  You should feel at least 10 movements in 2 hours.  If you don't, you should call the office or go to [place] and Birth Information The normal length of a pregnancy is 39-41 weeks. Preterm labor is when labor starts before 37 completed weeks of pregnancy. What are the risk factors for preterm labor? Preterm labor is more likely to occur in women who:  Have certain infections during pregnancy such as a bladder infection, sexually transmitted infection, or infection inside the uterus (chorioamnionitis).  Have a shorter-than-normal cervix.  Have gone into preterm labor before.  Have had surgery on their cervix.  Are younger than age 67 or older than age 90.  Are African American.  Are pregnant with twins or multiple babies (multiple gestation).  Take street drugs or smoke while pregnant.  Do not gain enough weight while pregnant.  Became pregnant shortly after having been pregnant. What are the symptoms of preterm labor? Symptoms of preterm labor include:  Cramps similar to those that can happen during a menstrual period. The cramps may happen with diarrhea.  Pain in the abdomen or lower back.  Regular uterine contractions that may feel like tightening of the abdomen.  A feeling of increased pressure in the pelvis.  Increased watery or bloody mucus discharge from the vagina.  Water breaking (ruptured amniotic sac). Why is it important to  recognize signs of preterm labor? It is important to recognize signs of preterm labor because babies who are born prematurely may not be fully developed. This can put them at an increased risk for:  Long-term (chronic) heart and lung problems.  Difficulty immediately after birth with regulating body systems, including blood sugar, body temperature, heart rate, and breathing rate.  Bleeding in the brain.  Cerebral palsy.  Learning difficulties.  Death. These risks are highest for babies who are born before 86 weeks of pregnancy. How is preterm labor treated? Treatment depends on the length of your pregnancy, your condition, and the health of your baby. It may involve:  Having a stitch (suture) placed in your cervix to prevent your cervix from opening too early (cerclage).  Taking or being given medicines, such as:  Hormone medicines. These may be given early in pregnancy to help support the pregnancy.  Medicine to stop contractions.  Medicines to help mature the baby's lungs. These may be prescribed if the risk of delivery is high.  Medicines to prevent your baby from developing cerebral palsy. If the labor happens before 34 weeks of pregnancy, you may need to stay in the hospital. What should I do if I think I am in preterm labor? If you think that you are going into preterm labor, call your health care provider right away. How can I prevent preterm labor in future pregnancies? To increase your chance of having a full-term pregnancy:  Do not use any tobacco products, such  as cigarettes, chewing tobacco, and e-cigarettes. If you need help quitting, ask your health care provider.  Do not use street drugs or medicines that have not been prescribed to you during your pregnancy.  Talk with your health care provider before taking any herbal supplements, even if you have been taking them regularly.  Make sure you gain a healthy amount of weight during your pregnancy.  Watch for  infection. If you think that you might have an infection, get it checked right away.  Make sure to tell your health care provider if you have gone into preterm labor before. This information is not intended to replace advice given to you by your health care provider. Make sure you discuss any questions you have with your health care provider. Document Released: 10/29/2003 Document Revised: 01/19/2016 Document Reviewed: 12/30/2015 Elsevier Interactive Patient Education  2017 [employer].

## 2016-12-02 NOTE — MAU Note (Signed)
Pt checked by Thalia Bloodgood at [phone] and cervix was closed. Maryann Alar was obtained and was neg.

## 2016-12-02 NOTE — MAU Provider Note (Signed)
History     CSN: [id number]  Arrival date and time: 12/02/16 7017   First Provider Initiated Contact with Patient 12/02/16 [phone]      Chief Complaint  Patient presents with  . rt side pain   HPI  CLOIE WOODEN is a 36 y.o. [phone] at 8w5dwho presents with right side pain. Woke up with pain this morning. States she was lying on her right side and had constant pain in right flank & right hip. Since then reports some contractions. Rates pain 8/10. Has not treated.  History of chronic hypertension; taking nifedipine but hasn't had dose yet today. Endorses "slight" headache this morning that she rates 3/10. Has not treated. Denies visual disturbance or epigastric pain. Denies vaginal bleeding. Brown mucoid discharge since Monday that has since thinned out to watery yellow discharge. Positive fetal movement. Hast appt with OB today at 2 pm.   OB History    Gravida Para Term Preterm AB Living   _0 SAB TAB Ectopic Multiple Live Births           3      Past Medical History:  Diagnosis Date  . Asthma   . Bronchitis   . [place]   . GI bleeding   . Headache   . Heart murmur   . HSV-2 (herpes simplex virus 2) infection   . Hx of chlamydia infection   . Hx of trichomoniasis   . Hypertension   . Ovarian cyst   . Syphilis     Past Surgical History:  Procedure Laterality Date  . CESAREAN SECTION     C/S x 2  . FOOT SURGERY    . TONSILLECTOMY    . WISDOM TOOTH EXTRACTION      Family History  Problem Relation Age of Onset  . Hypertension Mother   . Diabetes Mother   . Kidney disease Mother   . Cancer Father     trachea   . Alzheimer's disease Paternal Grandmother     Social History  Substance Use Topics  . Smoking status: Never Smoker  . Smokeless tobacco: Never Used  . Alcohol use No     Comment: occasionally    Allergies:  Allergies  Allergen Reactions  . Ace Inhibitors Shortness Of Breath    Short of breath  .  Latex Other (See Comments)    PT IS UNSURE OF WHETHER OR NOT THIS IS AN ALLERGY    Prescriptions Prior to Admission  Medication Sig Dispense Refill Last Dose  . ACCU-CHEK FASTCLIX LANCETS MISC 1 Device by Percutaneous route 4 (four) times daily. 100 each 12 Taking  . albuterol (PROAIR HFA) 108 (90 Base) MCG/ACT inhaler Inhale 2 puffs into the lungs every 4 (four) hours as needed for wheezing or shortness of breath. Reported on 10/08/2015 1 Inhaler 3 Taking  . aspirin (ASPIRIN CHILDRENS) 81 MG chewable tablet Chew 1 tablet (81 mg total) by mouth daily. 30 tablet 6 Taking  . glucose blood test strip Use as instructed 100 each 12 Taking  . glyBURIDE (DIABETA) 2.5 MG tablet 1 in am and 2 in the evning (Patient taking differently: 2.5 mg. 2 in am and 2 in the evning) 90 tablet 3 Taking  . ipratropium-albuterol (DUONEB) 0.5-2.5 (3) MG/3ML SOLN Take 3 mLs by nebulization every 4 (four) hours. (Patient taking differently: Take 3 mLs by nebulization every 4 (four) hours as needed (for cough/wheezing). ) 360  mL 0 Taking  . NIFEdipine (PROCARDIA-XL/ADALAT CC) 30 MG 24 hr tablet Take 1 tablet (30 mg total) by mouth daily. 30 tablet 3 Taking  . omeprazole (PRILOSEC) 20 MG capsule Take 1 capsule (20 mg total) by mouth daily. 30 capsule 6 Taking  . Prenatal Vit-Fe Fumarate-FA (PRENATAL COMPLETE PO) Take 1 tablet by mouth daily.    Taking    Review of Systems  Constitutional: Negative.   Eyes: Negative for visual disturbance.  Respiratory: Negative.   Cardiovascular: Negative.   Gastrointestinal: Positive for abdominal pain. Negative for constipation, diarrhea, nausea and vomiting.  Genitourinary: Positive for vaginal discharge. Negative for dysuria and vaginal bleeding.  Neurological: Positive for headaches. Negative for dizziness.   Physical Exam   Blood pressure 137/87, pulse 72, temperature 98.7 F (37.1 C), temperature source Oral, resp. rate 18, height 5' 1" (1.549 m), weight 234 lb 8 oz (106.4  kg), last menstrual period 03/20/2016, SpO2 98 %.  Temp:  [98.7 F (37.1 C)] 98.7 F (37.1 C) (04/13 0725) Pulse Rate:  [64-94] 94 (04/13 1332) Resp:  [18] 18 (04/13 0725) BP: (130-151)/(76-93) 130/76 (04/13 1332) SpO2:  [98 %] 98 % (04/13 0725) Weight:  [234 lb (106.1 kg)-234 lb 8 oz (106.4 kg)] 234 lb (106.1 kg) (04/13 1332)   Physical Exam  Nursing note and vitals reviewed. Constitutional: She is oriented to person, place, and time. She appears well-developed and well-nourished. No distress.  HENT:  Head: Normocephalic and atraumatic.  Eyes: Conjunctivae are normal. Right eye exhibits no discharge. Left eye exhibits no discharge. No scleral icterus.  Neck: Normal range of motion.  Cardiovascular: Normal rate, regular rhythm and normal heart sounds.   No murmur heard. Respiratory: Effort normal and breath sounds normal. No respiratory distress. She has no wheezes.  GI: Soft. There is no tenderness.  Genitourinary: No bleeding in the vagina. Vaginal discharge found.  Genitourinary Comments: No pooling. Cervix closed/thick/high.  Musculoskeletal: She exhibits edema.  Neurological: She is alert and oriented to person, place, and time. She has normal reflexes.  No clonus  Skin: Skin is warm and dry. She is not diaphoretic.  Psychiatric: She has a normal mood and affect. Her behavior is normal. Judgment and thought content normal.   Fetal Tracing:  Baseline: 140 Variability: moderate Accelerations: 15x15 Decelerations: none  Toco: irr ctx MAU Course  Procedures Results for orders placed or performed during the hospital encounter of 12/02/16 (from the past 48 hour(s))  Urinalysis, Routine w reflex microscopic     Status: Abnormal   Collection Time: 12/02/16  7:24 AM  Result Value Ref Range   Color, Urine STRAW (A) YELLOW   APPearance CLEAR CLEAR   Specific Gravity, Urine 1.004 (L) 1.005 - 1.030   pH 6.0 5.0 - 8.0   Glucose, UA NEGATIVE NEGATIVE mg/dL   Hgb urine dipstick  NEGATIVE NEGATIVE   Bilirubin Urine NEGATIVE NEGATIVE   Ketones, ur NEGATIVE NEGATIVE mg/dL   Protein, ur NEGATIVE NEGATIVE mg/dL   Nitrite NEGATIVE NEGATIVE   Leukocytes, UA NEGATIVE NEGATIVE  Protein / creatinine ratio, urine     Status: None   Collection Time: 12/02/16  7:24 AM  Result Value Ref Range   Creatinine, Urine 66.00 mg/dL   Total Protein, Urine <6 mg/dL    Comment: NO NORMAL RANGE ESTABLISHED FOR THIS TEST REPEATED TO VERIFY    Protein Creatinine Ratio        0.00 - 0.15 mg/mg[Cre]    Comment: RESULT BELOW REPORTABLE RANGE, UNABLE TO CALCULATE.  CBC     Status: Abnormal   Collection Time: 12/02/16  8:41 AM  Result Value Ref Range   WBC 4.4 4.0 - 10.5 K/uL   RBC 3.67 (L) 3.87 - 5.11 MIL/uL   Hemoglobin 11.3 (L) 12.0 - 15.0 g/dL   HCT 34.4 (L) 36.0 - 46.0 %   MCV 93.7 78.0 - 100.0 fL   MCH 30.8 26.0 - 34.0 pg   MCHC 32.8 30.0 - 36.0 g/dL   RDW 14.5 11.5 - 15.5 %   Platelets 182 150 - 400 K/uL  Comprehensive metabolic panel     Status: Abnormal   Collection Time: 12/02/16  8:41 AM  Result Value Ref Range   Sodium 136 135 - 145 mmol/L   Potassium 3.4 (L) 3.5 - 5.1 mmol/L   Chloride 106 101 - 111 mmol/L   CO2 21 (L) 22 - 32 mmol/L   Glucose, Bld 79 65 - 99 mg/dL   BUN 5 (L) 6 - 20 mg/dL   Creatinine, Ser 0.62 0.44 - 1.00 mg/dL   Calcium 8.7 (L) 8.9 - 10.3 mg/dL   Total Protein 7.3 6.5 - 8.1 g/dL   Albumin 3.2 (L) 3.5 - 5.0 g/dL   AST 19 15 - 41 U/L   ALT 13 (L) 14 - 54 U/L   Alkaline Phosphatase 101 38 - 126 U/L   Total Bilirubin 0.5 0.3 - 1.2 mg/dL   GFR calc non Af Amer >60 >60 mL/min   GFR calc Af Amer >60 >60 mL/min    Comment: (NOTE) The eGFR has been calculated using the CKD EPI equation. This calculation has not been validated in all clinical situations. eGFR's persistently <60 mL/min signify possible Chronic Kidney Disease.    Anion gap 9 5 - 15    MDM Elevated BP and headache -- pih labs ordered No pooling & fern negative No severe range  BPs Declines pain medication PIH labs wnl Ctx resolved & pt reports resolution in side pain  Assessment and Plan  A; 1. Maternal chronic hypertension in third trimester   2. Abdominal pain during pregnancy in third trimester   3. Vaginal discharge during pregnancy in third trimester    P: Discharge home Discussed reasons to return to MAU Keep f/u with OB  Jorje Guild 12/02/2016, 8:33 AM

## 2016-12-02 NOTE — Telephone Encounter (Signed)
Informed patient that rx for acyclovir was sent to pharmacy for HSV suppression and to start taking. Pt verbalized understanding.

## 2016-12-02 NOTE — Progress Notes (Signed)
High Risk Pregnancy Diagnosis(es): CHTN, A2/BDM [id number] [redacted]w[redacted]d Estimated Date of Delivery: 12/25/16 BP 130/76   Pulse 94   Wt 234 lb (106.1 kg)   LMP 03/20/2016 (Exact Date)   BMI 44.21 kg/m   Urinalysis: Negative HPI:  Only checking sugars BID right now- fasting and 2hr pp breakfast, keeps forgetting to check 2hr pp lunch, and eats late supper so is in bed when time to check 2hr pp supper. Reports all fbs <90 and all 2hr pp breakfast <120. Went to [facility] this am w/ Rt side pain- she thought it could be appendicitis. Denies fever/chills, some nausea, no vomiting. Was having some uc's while there, cx was closed, and states uc's stopped. Pre-e labs normal.  BP, weight, and urine reviewed.  Reports good fm. Denies regular uc's, lof, vb, uti s/s. No complaints.  Fundal Height:  41 Fetal Heart rate:  161 u/s Edema:  +1 GBS, gc/ct collected  Reviewed today's u/s: bpp 8/8, dopp .57 & .61= 75% w/ good EDF, afi 9.6cm, vtx Discussed ptl s/s, fkc, pre-e s/s All questions were answered Assessment: [redacted]w[redacted]d CHTN, A2/BDM Medication(s) Plans:  Glyburide 5mg  BID, procardia xl 30mg  daily, rx acyclovir 400mg  TID for HSV suppression Treatment Plan:  2x/wk testing nst alt w/ bpp/dopp u/s, has RLTCS scheduled for 4/30 Follow up on Tues for high-risk OB appt and NST Set alarm to help remind to take 2hr pp lunch

## 2016-12-06 ENCOUNTER — Encounter: Payer: Self-pay | Admitting: Obstetrics & Gynecology

## 2016-12-06 ENCOUNTER — Ambulatory Visit (INDEPENDENT_AMBULATORY_CARE_PROVIDER_SITE_OTHER): Payer: Managed Care, Other (non HMO) | Admitting: Obstetrics & Gynecology

## 2016-12-06 VITALS — BP 146/82 | HR 68 | Wt 233.0 lb

## 2016-12-06 DIAGNOSIS — O10913 Unspecified pre-existing hypertension complicating pregnancy, third trimester: Secondary | ICD-10-CM

## 2016-12-06 DIAGNOSIS — Z3A37 37 weeks gestation of pregnancy: Secondary | ICD-10-CM | POA: Diagnosis not present

## 2016-12-06 DIAGNOSIS — O10919 Unspecified pre-existing hypertension complicating pregnancy, unspecified trimester: Secondary | ICD-10-CM

## 2016-12-06 DIAGNOSIS — O0990 Supervision of high risk pregnancy, unspecified, unspecified trimester: Secondary | ICD-10-CM

## 2016-12-06 DIAGNOSIS — Z1389 Encounter for screening for other disorder: Secondary | ICD-10-CM

## 2016-12-06 DIAGNOSIS — O24419 Gestational diabetes mellitus in pregnancy, unspecified control: Secondary | ICD-10-CM

## 2016-12-06 DIAGNOSIS — Z331 Pregnant state, incidental: Secondary | ICD-10-CM

## 2016-12-06 DIAGNOSIS — O0993 Supervision of high risk pregnancy, unspecified, third trimester: Secondary | ICD-10-CM

## 2016-12-06 LAB — POCT URINALYSIS DIPSTICK
Blood, UA: NEGATIVE
Glucose, UA: NEGATIVE
Ketones, UA: NEGATIVE
Leukocytes, UA: NEGATIVE
Nitrite, UA: NEGATIVE

## 2016-12-06 NOTE — Progress Notes (Signed)
Fetal Surveillance Testing today:  Reactive NST   High Risk Pregnancy Diagnosis(es):   CHTN, Class A2/B DM  [id number] [redacted]w[redacted]d Estimated Date of Delivery: 12/25/16  Blood pressure (!) 146/82, pulse 68, weight 233 lb (105.7 kg), last menstrual period 03/20/2016.  Urinalysis: Negative   HPI: The patient is being seen today for ongoing management of as above. Today she reports CBD are good   BP weight and urine results all reviewed and noted. Patient reports good fetal movement, denies any bleeding and no rupture of membranes symptoms or regular contractions.  Fundal Height:  39 Fetal Heart rate:  135 Edema:  trace  Patient is without complaints other than noted in her HPI. All questions were answered.  All lab and sonogram results have been reviewed. Comments:    Assessment:  1.  Pregnancy at [redacted]w[redacted]d,  Estimated Date of Delivery: 12/25/16 :                          2.  CHTN                        3.  Class A2/B DM  Medication(s) Plans:  Procardia xl 30 mg, glyburide 5 mg BID  Treatment Plan:  Twice weekly surveillance, sonogram and NST alternating, deliver 39 weeks  No Follow-up on file. for appointment for high risk OB care  No orders of the defined types were placed in this encounter.  Orders Placed This Encounter  Procedures  . US FETAL BPP W/NONSTRESS  . [place] UA Cord Doppler  . POCT urinalysis dipstick

## 2016-12-07 ENCOUNTER — Encounter: Payer: Self-pay | Admitting: Women's Health

## 2016-12-07 ENCOUNTER — Ambulatory Visit (INDEPENDENT_AMBULATORY_CARE_PROVIDER_SITE_OTHER): Payer: Managed Care, Other (non HMO) | Admitting: Women's Health

## 2016-12-07 ENCOUNTER — Encounter (HOSPITAL_COMMUNITY): Payer: Self-pay

## 2016-12-07 ENCOUNTER — Inpatient Hospital Stay (HOSPITAL_COMMUNITY)
Admission: AD | Admit: 2016-12-07 | Discharge: 2016-12-07 | Disposition: A | Discharge status: Home / Self Care | Payer: Managed Care, Other (non HMO) | Source: Ambulatory Visit | Attending: Obstetrics & Gynecology | Admitting: Obstetrics & Gynecology

## 2016-12-07 VITALS — BP 158/70 | HR 76 | Wt 235.0 lb

## 2016-12-07 DIAGNOSIS — E119 Type 2 diabetes mellitus without complications: Secondary | ICD-10-CM | POA: Insufficient documentation

## 2016-12-07 DIAGNOSIS — G44219 Episodic tension-type headache, not intractable: Secondary | ICD-10-CM

## 2016-12-07 DIAGNOSIS — O24113 Pre-existing diabetes mellitus, type 2, in pregnancy, third trimester: Secondary | ICD-10-CM | POA: Insufficient documentation

## 2016-12-07 DIAGNOSIS — Z3A37 37 weeks gestation of pregnancy: Secondary | ICD-10-CM | POA: Diagnosis not present

## 2016-12-07 DIAGNOSIS — O10013 Pre-existing essential hypertension complicating pregnancy, third trimester: Secondary | ICD-10-CM | POA: Diagnosis present

## 2016-12-07 DIAGNOSIS — O10919 Unspecified pre-existing hypertension complicating pregnancy, unspecified trimester: Secondary | ICD-10-CM | POA: Diagnosis not present

## 2016-12-07 DIAGNOSIS — R51 Headache: Secondary | ICD-10-CM | POA: Insufficient documentation

## 2016-12-07 DIAGNOSIS — Z1389 Encounter for screening for other disorder: Secondary | ICD-10-CM

## 2016-12-07 DIAGNOSIS — Z331 Pregnant state, incidental: Secondary | ICD-10-CM

## 2016-12-07 DIAGNOSIS — Z7984 Long term (current) use of oral hypoglycemic drugs: Secondary | ICD-10-CM | POA: Insufficient documentation

## 2016-12-07 DIAGNOSIS — O0993 Supervision of high risk pregnancy, unspecified, third trimester: Secondary | ICD-10-CM

## 2016-12-07 DIAGNOSIS — O24419 Gestational diabetes mellitus in pregnancy, unspecified control: Secondary | ICD-10-CM

## 2016-12-07 DIAGNOSIS — O10913 Unspecified pre-existing hypertension complicating pregnancy, third trimester: Secondary | ICD-10-CM

## 2016-12-07 LAB — CBC
HCT: 35.8 % — ABNORMAL LOW (ref 36.0–46.0)
Hemoglobin: 11.8 g/dL — ABNORMAL LOW (ref 12.0–15.0)
MCH: 30.6 pg (ref 26.0–34.0)
MCHC: 33 g/dL (ref 30.0–36.0)
MCV: 93 fL (ref 78.0–100.0)
Platelets: 175 10*3/uL (ref 150–400)
RBC: 3.85 MIL/uL — ABNORMAL LOW (ref 3.87–5.11)
RDW: 14.5 % (ref 11.5–15.5)
WBC: 5.5 10*3/uL (ref 4.0–10.5)

## 2016-12-07 LAB — POCT URINALYSIS DIPSTICK
Blood, UA: NEGATIVE
Glucose, UA: NEGATIVE
Ketones, UA: NEGATIVE
Leukocytes, UA: NEGATIVE
Nitrite, UA: NEGATIVE
Protein, UA: NEGATIVE

## 2016-12-07 LAB — COMPREHENSIVE METABOLIC PANEL
ALT: 15 U/L (ref 14–54)
AST: 21 U/L (ref 15–41)
Albumin: 3.5 g/dL (ref 3.5–5.0)
Alkaline Phosphatase: 122 U/L (ref 38–126)
Anion gap: 9 (ref 5–15)
BUN: 6 mg/dL (ref 6–20)
CO2: 22 mmol/L (ref 22–32)
Calcium: 8.9 mg/dL (ref 8.9–10.3)
Chloride: 104 mmol/L (ref 101–111)
Creatinine, Ser: 0.63 mg/dL (ref 0.44–1.00)
GFR calc Af Amer: >60 mL/min (ref >60)
GFR calc non Af Amer: >60 mL/min (ref >60)
Glucose, Bld: 63 mg/dL — ABNORMAL LOW (ref 65–99)
Potassium: 3.1 mmol/L — ABNORMAL LOW (ref 3.5–5.1)
Sodium: 135 mmol/L (ref 135–145)
Total Bilirubin: 0.6 mg/dL (ref 0.3–1.2)
Total Protein: 7.5 g/dL (ref 6.5–8.1)

## 2016-12-07 LAB — PROTEIN / CREATININE RATIO, URINE
Creatinine, Urine: 73 mg/dL
Protein Creatinine Ratio: 0.1 mg/mg{Cre} (ref 0.00–0.15)
Total Protein, Urine: 7 mg/dL

## 2016-12-07 LAB — GC/CHLAMYDIA PROBE AMP
Chlamydia trachomatis, NAA: NEGATIVE
Neisseria gonorrhoeae by PCR: NEGATIVE

## 2016-12-07 MED ORDER — BUTALBITAL-APAP-CAFFEINE 50-325-40 MG PO TABS
1.0000 | ORAL_TABLET | ORAL | Status: DC | PRN
  Administered 2016-12-07: 1 via ORAL
  Filled 2016-12-07: qty 1

## 2016-12-07 MED ORDER — BUTALBITAL-APAP-CAFFEINE 50-325-40 MG PO TABS: 1.0000 | ORAL_TABLET | ORAL | 0 refills | Status: DC | PRN

## 2016-12-07 MED ORDER — ACETAMINOPHEN 500 MG PO TABS
1000.0000 mg | ORAL_TABLET | Freq: Once | ORAL | Status: AC
  Administered 2016-12-07: 1000 mg via ORAL
  Filled 2016-12-07: qty 2

## 2016-12-07 NOTE — Progress Notes (Signed)
Work-in  High Risk Pregnancy Diagnosis(es): [place], A2/BDM [id number] [redacted]w[redacted]d Estimated Date of Delivery: 12/25/16 BP (!) 158/70   Pulse 76   Wt 235 lb (106.6 kg)   LMP 03/20/2016 (Exact Date)   BMI 44.40 kg/m   Urinalysis: Negative HPI:  Woke up with headache, thought she was hungry so ate and didn't improve, then took her procardia and glyburide ~1200 also w/o relief. Has not tried apap. Blurred vision throughout day, none right now. Took [facility] at [facility] and was 168/99.  Denies ruq/epigastric pain, n/v. Reports sugars are good.  BP, weight, and urine reviewed.  Reports good fm. Denies regular uc's, lof, vb, uti s/s.   Fundal Height:  40 Fetal Heart rate:  157 Edema:  1+, DTRs 1-2+, no clonus No proteinuria  Will send to mau for pre-e work-up, notified Dr. Baron Sane All questions were answered Assessment: [redacted]w[redacted]d CHTN, A2/BDM Medication(s) Plans:  Procardia xl 30 daily, glyburide 5mg  BID Treatment Plan:  To mau for eval, if d/c'd continue 2x/wk testing w/ RCS @ 39wks as scheduled 4/30 Follow up as scheduled for high-risk OB appt on Friday if d/c'd

## 2016-12-07 NOTE — MAU Note (Signed)
Pt seen @ MD office, sent to MAU for elevated BP, pt woke up this morning with HA, feeling pressure behind L ear.  Having blurry vision.  Was 168/99 in [place].  Denies contractions, bleeding or LOF.  Feeling good FM.

## 2016-12-07 NOTE — MAU Provider Note (Signed)
History     CSN: [id number]  Arrival date and time: 12/07/16 1650   None     Chief Complaint  Patient presents with  . Hypertension  . Headache  . Blurred Vision   Patient is a 36 year old G4 P3 at 37 weeks and 3 days with chronic hypertension and A2 B diabetes on glyburide. She reports that she was up a little bit later than usual last night and slept in and therefore did not take her blood pressure medicine on time like she usually does. Around noon she developed a headache and call the office. They recommended she check her blood pressure she went to the pharmacy and it was 160/99 there she was instructed to come to the office where her blood pressure was 150 over 70s and she reported some intermittent blurred vision and a 4-5 out of 10 headache. She was instructed to come to the MA U for further evaluation. In MA U she does state she has a 4-5 out of 10 headache but no blurred vision. She has some mild right upper quadrant pain she denies any swelling or trouble breathing.    OB History    Gravida Para Term Preterm AB Living   4 3 3     3    SAB TAB Ectopic Multiple Live Births           3      Past Medical History:  Diagnosis Date  . Asthma   . Bronchitis   . Diabetes mellitus without complication ([place])   . GI bleeding   . Headache   . Heart murmur   . HSV-2 (herpes simplex virus 2) infection   . Hx of chlamydia infection   . Hx of trichomoniasis   . Hypertension   . Ovarian cyst   . Syphilis     Past Surgical History:  Procedure Laterality Date  . CESAREAN SECTION     C/S x 2  . FOOT SURGERY    . TONSILLECTOMY    . WISDOM TOOTH EXTRACTION      Family History  Problem Relation Age of Onset  . Hypertension Mother   . Diabetes Mother   . Kidney disease Mother   . Cancer Father     trachea   . Alzheimer's disease Paternal Grandmother     Social History  Substance Use Topics  . Smoking status: Never Smoker  . Smokeless tobacco: Never Used  . Alcohol  use No     Comment: occasionally    Allergies:  Allergies  Allergen Reactions  . Ace Inhibitors Shortness Of Breath    Short of breath  . Latex Other (See Comments)    Causes irritation.    Prescriptions Prior to Admission  Medication Sig Dispense Refill Last Dose  . aspirin (ASPIRIN CHILDRENS) 81 MG chewable tablet Chew 1 tablet (81 mg total) by mouth daily. 30 tablet 6 12/07/2016 at Unknown time  . glyBURIDE (DIABETA) 2.5 MG tablet 1 in am and 2 in the evning (Patient taking differently: Take 5 mg by mouth 2 (two) times daily with a meal. 2 in am and 2 in the evning) 90 tablet 3 12/07/2016 at Unknown time  . NIFEdipine (PROCARDIA-XL/ADALAT CC) 30 MG 24 hr tablet Take 1 tablet (30 mg total) by mouth daily. 30 tablet 3 12/07/2016 at Unknown time  . omeprazole (PRILOSEC) 20 MG capsule Take 1 capsule (20 mg total) by mouth daily. 30 capsule 6 Past Month at Unknown time  . Prenatal Vit-Fe  Fumarate-FA (PRENATAL MULTIVITAMIN) TABS tablet Take 1 tablet by mouth daily at 12 noon.   12/07/2016 at Unknown time  . ACCU-CHEK FASTCLIX LANCETS MISC 1 Device by Percutaneous route 4 (four) times daily. 100 each 12 Taking  . acyclovir (ZOVIRAX) 400 MG tablet Take 1 tablet (400 mg total) by mouth 3 (three) times daily. (Patient not taking: Reported on 12/07/2016) 90 tablet 3 Not Taking at Unknown time  . albuterol (PROAIR HFA) 108 (90 Base) MCG/ACT inhaler Inhale 2 puffs into the lungs every 4 (four) hours as needed for wheezing or shortness of breath. Reported on 10/08/2015 1 Inhaler 3 Rescue  . glucose blood test strip Use as instructed 100 each 12 Taking  . ipratropium-albuterol (DUONEB) 0.5-2.5 (3) MG/3ML SOLN Take 3 mLs by nebulization every 4 (four) hours. (Patient not taking: Reported on 12/07/2016) 360 mL 0 Not Taking at Unknown time    Review of Systems  Constitutional: Negative for chills and fever.  HENT: Negative for congestion and rhinorrhea.   Respiratory: Negative for cough and shortness of  breath.   Gastrointestinal: Positive for abdominal pain. Negative for abdominal distention, constipation, diarrhea, nausea and vomiting.  Genitourinary: Negative for difficulty urinating, dysuria and frequency.  Neurological: Negative for dizziness, weakness and numbness.   Physical Exam   Blood pressure 140/78, pulse 91, temperature 98 F (36.7 C), temperature source Oral, resp. rate 18, last menstrual period 03/20/2016.  Physical Exam  Vitals reviewed. Constitutional: She is oriented to person, place, and time. She appears well-developed and well-nourished.  HENT:  Head: Normocephalic and atraumatic.  Cardiovascular: Normal rate and intact distal pulses.   Respiratory: Effort normal. No respiratory distress.  GI: Soft. She exhibits no distension. There is no rebound and no guarding.  Minimal tenderness in right upper quadrant  Musculoskeletal: Normal range of motion. She exhibits no edema.  Neurological: She is alert and oriented to person, place, and time. She displays normal reflexes. No cranial nerve deficit.  Skin: Skin is warm and dry. No erythema.  Psychiatric: She has a normal mood and affect. Her behavior is normal.    MAU Course  Procedures  MDM In MA U patient underwent evaluation for pregnancy-induced hypertension. Blood pressures remained in the high 130s and low 140s over 70s and 80s which are close to her baseline blood pressures. She had normal PIH labs and including CMP CBC and protein to creatinine ratio.  Patient did receive Tylenol for her headache which did not seem to improve the pain. She also received Fioricet prior to discharge which seemed to be improving her headache.  Assessment and Plan  #1: Chronic hypertension likely blood pressure elevation was secondary to delay in her medicine. Recommend continue to take medicine as prescribed. #2: Headache may have been related to blood pressure patient does have a history of chronic headaches. Recommend Fioricet  when necessary. Patient should be present with any severe worsening of her headache blood pressure or right upper quadrant pain.  Tina Wall 12/07/2016, 6:29 PM

## 2016-12-07 NOTE — Patient Instructions (Signed)
Call the office [phone]) or go to [facility] for these signs of pre-eclampsia:  Severe headache that does not go away with Tylenol  Visual changes- seeing spots, double, blurred vision  Pain under your right breast or upper abdomen that does not go away with Tums or heartburn medicine  Nausea and/or vomiting  Severe swelling in your hands, feet, and face     Call the office [phone]) or go to [facility] if:  You begin to have strong, frequent contractions  Your water breaks.  Sometimes it is a big gush of fluid, sometimes it is just a trickle that keeps getting your panties wet or running down your legs  You have vaginal bleeding.  It is normal to have a small amount of spotting if your cervix was checked.   You don't feel your baby moving like normal.  If you don't, get you something to eat and drink and lay down and focus on feeling your baby move.  You should feel at least 10 movements in 2 hours.  If you don't, you should call the office or go to [facility].

## 2016-12-07 NOTE — Discharge Instructions (Signed)
Hypertension During Pregnancy Hypertension is also called high blood pressure. High blood pressure means that the force of your blood moving in your body is too strong. When you are pregnant, this condition should be watched carefully. It can cause problems for you and your baby. Follow these instructions at home: Eating and drinking  Drink enough fluid to keep your pee (urine) clear or pale yellow.  Eat healthy foods that are low in salt (sodium). ? Do not add salt to your food. ? Check labels on foods and drinks to see much salt is in them. Look on the label where you see "Sodium." Lifestyle  Do not use any products that contain nicotine or tobacco, such as cigarettes and e-cigarettes. If you need help quitting, ask your doctor.  Do not use alcohol.  Avoid caffeine.  Avoid stress. Rest and get plenty of sleep. General instructions  Take over-the-counter and prescription medicines only as told by your doctor.  While lying down, lie on your left side. This keeps pressure off your baby.  While sitting or lying down, raise (elevate) your feet. Try putting some pillows under your lower legs.  Exercise regularly. Ask your doctor what kinds of exercise are best for you.  Keep all prenatal and follow-up visits as told by your doctor. This is important. Contact a doctor if:  You have symptoms that your doctor told you to watch for, such as: ? Fever. ? Throwing up (vomiting). ? Headache. Get help right away if:  You have very bad pain in your belly (abdomen).  You are throwing up, and this does not get better with treatment.  You suddenly get swelling in your hands, ankles, or face.  You gain 4 lb (1.8 kg) or more in 1 week.  You get bleeding from your vagina.  You have blood in your pee.  You do not feel your baby moving as much as normal.  You have a change in vision.  You have muscle twitching or sudden tightening (spasms).  You have trouble breathing.  Your lips  or fingernails turn blue. This information is not intended to replace advice given to you by your health care provider. Make sure you discuss any questions you have with your health care provider. Document Released: 09/10/2010 Document Revised: 04/19/2016 Document Reviewed: 04/19/2016 Elsevier Interactive Patient Education  2017 [employer]  

## 2016-12-08 LAB — CULTURE, BETA STREP (GROUP B ONLY): Strep Gp B Culture: POSITIVE — AB

## 2016-12-09 ENCOUNTER — Ambulatory Visit (INDEPENDENT_AMBULATORY_CARE_PROVIDER_SITE_OTHER): Payer: Managed Care, Other (non HMO) | Admitting: Obstetrics & Gynecology

## 2016-12-09 ENCOUNTER — Ambulatory Visit (INDEPENDENT_AMBULATORY_CARE_PROVIDER_SITE_OTHER): Payer: Managed Care, Other (non HMO)

## 2016-12-09 ENCOUNTER — Encounter: Payer: Self-pay | Admitting: Obstetrics & Gynecology

## 2016-12-09 ENCOUNTER — Encounter (HOSPITAL_COMMUNITY): Payer: Self-pay

## 2016-12-09 VITALS — BP 148/82 | HR 92 | Wt 236.0 lb

## 2016-12-09 DIAGNOSIS — O24419 Gestational diabetes mellitus in pregnancy, unspecified control: Secondary | ICD-10-CM

## 2016-12-09 DIAGNOSIS — O10913 Unspecified pre-existing hypertension complicating pregnancy, third trimester: Secondary | ICD-10-CM

## 2016-12-09 DIAGNOSIS — O10919 Unspecified pre-existing hypertension complicating pregnancy, unspecified trimester: Secondary | ICD-10-CM

## 2016-12-09 DIAGNOSIS — O0993 Supervision of high risk pregnancy, unspecified, third trimester: Secondary | ICD-10-CM

## 2016-12-09 DIAGNOSIS — Z331 Pregnant state, incidental: Secondary | ICD-10-CM

## 2016-12-09 DIAGNOSIS — Z1389 Encounter for screening for other disorder: Secondary | ICD-10-CM

## 2016-12-09 LAB — POCT URINALYSIS DIPSTICK
Blood, UA: NEGATIVE
Glucose, UA: NEGATIVE
Ketones, UA: NEGATIVE
Leukocytes, UA: NEGATIVE
Nitrite, UA: NEGATIVE

## 2016-12-09 NOTE — Progress Notes (Signed)
Fetal Surveillance Testing today:  BPP 8/8 with good Dopplers    High Risk Pregnancy Diagnosis(es):   CHTN, Class A2 DM  [id number] [redacted]w[redacted]d Estimated Date of Delivery: 12/25/16  Blood pressure (!) 148/82, pulse 92, weight 236 lb (107 kg), last menstrual period 03/20/2016.  Urinalysis: Negative   HPI: The patient is being seen today for ongoing management of as above. Today she reports CBG good   BP weight and urine results all reviewed and noted. Patient reports good fetal movement, denies any bleeding and no rupture of membranes symptoms or regular contractions.  Fundal Height:  40 Fetal Heart rate:  144 Edema:  none  Patient is without complaints other than noted in her HPI. All questions were answered.  All lab and sonogram results have been reviewed. Comments:    Assessment:  1.  Pregnancy at [redacted]w[redacted]d,  Estimated Date of Delivery: 12/25/16 :                          2.  CHTN                        3.  Class A2/B DM  Medication(s) Plans:  Glyburide 5 bid, procardia xl 30 mg  Treatment Plan:  NST Tuesday, sonogram 1 week c section 12/19/2016  Return in about 4 days (around 12/13/2016) for NST, HROB. for appointment for high risk OB care  No orders of the defined types were placed in this encounter.  Orders Placed This Encounter  Procedures  . POCT urinalysis dipstick

## 2016-12-09 NOTE — Progress Notes (Signed)
[place] 16+6 wks,cephalic,BPP 0/6,YOK 599 bpm,post pl gr 2,AFI 11 cm,RI .64,.50,.74  82%

## 2016-12-13 ENCOUNTER — Other Ambulatory Visit: Payer: Self-pay | Admitting: Obstetrics & Gynecology

## 2016-12-13 ENCOUNTER — Ambulatory Visit (INDEPENDENT_AMBULATORY_CARE_PROVIDER_SITE_OTHER): Payer: Managed Care, Other (non HMO) | Admitting: Obstetrics & Gynecology

## 2016-12-13 ENCOUNTER — Encounter: Payer: Self-pay | Admitting: Obstetrics & Gynecology

## 2016-12-13 VITALS — BP 140/80 | HR 80 | Wt 237.0 lb

## 2016-12-13 DIAGNOSIS — O1213 Gestational proteinuria, third trimester: Secondary | ICD-10-CM | POA: Diagnosis not present

## 2016-12-13 DIAGNOSIS — Z331 Pregnant state, incidental: Secondary | ICD-10-CM

## 2016-12-13 DIAGNOSIS — O10919 Unspecified pre-existing hypertension complicating pregnancy, unspecified trimester: Secondary | ICD-10-CM

## 2016-12-13 DIAGNOSIS — O24419 Gestational diabetes mellitus in pregnancy, unspecified control: Secondary | ICD-10-CM

## 2016-12-13 DIAGNOSIS — O0993 Supervision of high risk pregnancy, unspecified, third trimester: Secondary | ICD-10-CM

## 2016-12-13 DIAGNOSIS — Z3A38 38 weeks gestation of pregnancy: Secondary | ICD-10-CM

## 2016-12-13 DIAGNOSIS — O10913 Unspecified pre-existing hypertension complicating pregnancy, third trimester: Secondary | ICD-10-CM

## 2016-12-13 DIAGNOSIS — Z1389 Encounter for screening for other disorder: Secondary | ICD-10-CM

## 2016-12-13 LAB — POCT URINALYSIS DIPSTICK
Blood, UA: NEGATIVE
Glucose, UA: NEGATIVE
Ketones, UA: NEGATIVE
Leukocytes, UA: NEGATIVE
Nitrite, UA: NEGATIVE
Protein, UA: 1

## 2016-12-13 MED ORDER — GLYBURIDE 5 MG PO TABS: 5.0000 mg | ORAL_TABLET | Freq: Two times a day (BID) | ORAL | 1 refills | Status: DC

## 2016-12-13 NOTE — Progress Notes (Signed)
Fetal Surveillance Testing today:  Reactive NST   High Risk Pregnancy Diagnosis(es):   CHTN, Class A2/B DM  [id number] [redacted]w[redacted]d Estimated Date of Delivery: 12/25/16  Blood pressure 140/80, pulse 80, weight 237 lb (107.5 kg), last menstrual period 03/20/2016.  Urinalysis: Positive for 1+ protein   HPI: The patient is being seen today for ongoing management of as above. Today she reports CBG are good   BP weight and urine results all reviewed and noted. Patient reports good fetal movement, denies any bleeding and no rupture of membranes symptoms or regular contractions.  Fundal Height:  38 Fetal Heart rate:  135 Edema:  none  Patient is without complaints other than noted in her HPI. All questions were answered.  All lab and sonogram results have been reviewed. Comments:    Assessment:  1.  Pregnancy at [redacted]w[redacted]d,  Estimated Date of Delivery: 12/25/16 :                          2.  CHTN                        3.  Class A2/B DM  Medication(s) Plans:  Glyburide 5 mg, nifedipine xl 30 daily  Treatment Plan:  Sonogram 3 days, proteinc reatinine ratio today  Return in about 3 days (around 12/16/2016) for BPP/sono, HROB. for appointment for high risk OB care  Meds ordered this encounter  Medications  . DISCONTD: glyBURIDE (DIABETA) 5 MG tablet    Sig: Take 1 tablet (5 mg total) by mouth 2 (two) times daily with a meal.    Dispense:  60 tablet    Refill:  1   Orders Placed This Encounter  Procedures  . US FETAL BPP W/NONSTRESS  . [place] UA Cord Doppler  . Protein / Creatinine Ratio, Urine  . POCT urinalysis dipstick

## 2016-12-14 LAB — PROTEIN / CREATININE RATIO, URINE
Creatinine, Urine: 217.4 mg/dL
Protein, Ur: 26.4 mg/dL
Protein/Creat Ratio: 121 mg/g creat (ref 0–200)

## 2016-12-16 ENCOUNTER — Other Ambulatory Visit: Payer: Self-pay | Admitting: Obstetrics & Gynecology

## 2016-12-16 ENCOUNTER — Encounter: Payer: Self-pay | Admitting: Women's Health

## 2016-12-16 ENCOUNTER — Encounter (HOSPITAL_COMMUNITY)
Admission: RE | Admit: 2016-12-16 | Discharge: 2016-12-16 | Disposition: A | Discharge status: Home / Self Care | Payer: Managed Care, Other (non HMO) | Source: Ambulatory Visit | Attending: Obstetrics & Gynecology | Admitting: Obstetrics & Gynecology

## 2016-12-16 ENCOUNTER — Ambulatory Visit (INDEPENDENT_AMBULATORY_CARE_PROVIDER_SITE_OTHER): Payer: Managed Care, Other (non HMO) | Admitting: Women's Health

## 2016-12-16 ENCOUNTER — Ambulatory Visit (INDEPENDENT_AMBULATORY_CARE_PROVIDER_SITE_OTHER): Payer: Managed Care, Other (non HMO)

## 2016-12-16 VITALS — BP 142/80 | HR 98 | Wt 238.0 lb

## 2016-12-16 DIAGNOSIS — O24419 Gestational diabetes mellitus in pregnancy, unspecified control: Secondary | ICD-10-CM | POA: Diagnosis not present

## 2016-12-16 DIAGNOSIS — Z331 Pregnant state, incidental: Secondary | ICD-10-CM | POA: Diagnosis not present

## 2016-12-16 DIAGNOSIS — Z1389 Encounter for screening for other disorder: Secondary | ICD-10-CM | POA: Diagnosis not present

## 2016-12-16 DIAGNOSIS — O10919 Unspecified pre-existing hypertension complicating pregnancy, unspecified trimester: Secondary | ICD-10-CM

## 2016-12-16 DIAGNOSIS — O10913 Unspecified pre-existing hypertension complicating pregnancy, third trimester: Secondary | ICD-10-CM

## 2016-12-16 DIAGNOSIS — O0993 Supervision of high risk pregnancy, unspecified, third trimester: Secondary | ICD-10-CM | POA: Diagnosis not present

## 2016-12-16 HISTORY — DX: Gestational diabetes mellitus in pregnancy, unspecified control: O24.419

## 2016-12-16 LAB — TYPE AND SCREEN
ABO/RH(D): O POS
Antibody Screen: NEGATIVE

## 2016-12-16 LAB — CBC
HCT: 34.4 % — ABNORMAL LOW (ref 36.0–46.0)
Hemoglobin: 11.5 g/dL — ABNORMAL LOW (ref 12.0–15.0)
MCH: 30.8 pg (ref 26.0–34.0)
MCHC: 33.4 g/dL (ref 30.0–36.0)
MCV: 92.2 fL (ref 78.0–100.0)
Platelets: 194 10*3/uL (ref 150–400)
RBC: 3.73 MIL/uL — ABNORMAL LOW (ref 3.87–5.11)
RDW: 14.3 % (ref 11.5–15.5)
WBC: 4.6 10*3/uL (ref 4.0–10.5)

## 2016-12-16 LAB — COMPREHENSIVE METABOLIC PANEL
ALT: 12 U/L — ABNORMAL LOW (ref 14–54)
AST: 19 U/L (ref 15–41)
Albumin: 3.3 g/dL — ABNORMAL LOW (ref 3.5–5.0)
Alkaline Phosphatase: 131 U/L — ABNORMAL HIGH (ref 38–126)
Anion gap: 8 (ref 5–15)
BUN: 9 mg/dL (ref 6–20)
CO2: 21 mmol/L — ABNORMAL LOW (ref 22–32)
Calcium: 9.2 mg/dL (ref 8.9–10.3)
Chloride: 106 mmol/L (ref 101–111)
Creatinine, Ser: 0.66 mg/dL (ref 0.44–1.00)
GFR calc Af Amer: >60 mL/min (ref >60)
GFR calc non Af Amer: >60 mL/min (ref >60)
Glucose, Bld: 89 mg/dL (ref 65–99)
Potassium: 3.1 mmol/L — ABNORMAL LOW (ref 3.5–5.1)
Sodium: 135 mmol/L (ref 135–145)
Total Bilirubin: 0.5 mg/dL (ref 0.3–1.2)
Total Protein: 7 g/dL (ref 6.5–8.1)

## 2016-12-16 LAB — POCT URINALYSIS DIPSTICK
Blood, UA: NEGATIVE
Glucose, UA: NEGATIVE
Ketones, UA: NEGATIVE
Leukocytes, UA: NEGATIVE
Nitrite, UA: NEGATIVE
Protein, UA: NEGATIVE

## 2016-12-16 LAB — ABO/RH: ABO/RH(D): O POS

## 2016-12-16 NOTE — Patient Instructions (Signed)
[place]  12/16/2016   Your procedure is scheduled on:  12/19/2016  Enter through the Main Entrance of [facility] at [place] up the phone at the desk and dial [phone].   Call this number if you have problems the morning of surgery: [phone]   Remember:   Do not eat food:After Midnight.  Do not drink clear liquids: After Midnight.  Take these medicines the morning of surgery with A SIP OF WATER: none   Do not wear jewelry, make-up or nail polish.  Do not wear lotions, powders, or perfumes. Do not wear deodorant.  Do not shave 48 hours prior to surgery.  Do not bring valuables to the hospital.  [facility] is not   responsible for any belongings or valuables brought to the hospital.  Contacts, dentures or bridgework may not be worn into surgery.  Leave suitcase in the car. After surgery it may be brought to your room.  For patients admitted to the hospital, checkout time is 11:00 AM the day of              discharge.   Patients discharged the day of surgery will not be allowed to drive             home.  Name and phone number of your driver: na  Special Instructions:   N/A   Please read over the following fact sheets that you were given:   Surgical Site Infection Prevention

## 2016-12-16 NOTE — Progress Notes (Signed)
[place] 42+8 wks,cephalic,fhr 768 bpm,post pl gr 3,afi 14.6 cm,BPP 8/8,RI .60,.61 78%,EFW 4142 g 95th %

## 2016-12-16 NOTE — Progress Notes (Signed)
High Risk Pregnancy Diagnosis(es): CHTN, A2/BDM [id number] [redacted]w[redacted]d Estimated Date of Delivery: 12/25/16 BP (!) 142/80   Pulse 98   Wt 238 lb (108 kg)   LMP 03/20/2016 (Exact Date)   BMI 44.97 kg/m   Urinalysis: Negative HPI:  Some am headaches, goes away w/ apap. Denies visual changes, ruq/epigastric pain, n/v, etc.  FBS 69-86, 2hr pp: 87-136 (3>120) BP, weight, and urine reviewed.  Reports good fm. Denies regular uc's, lof, vb, uti s/s. No complaints.  Fundal Height:  41 Fetal Heart rate:  154 u/s Edema:  none  Reviewed today's u/s: bpp 8/8, dopp .60 & .61=78%, EFW 95% w/ AC 99%, AFI 14.6cm, vtx Discussed labor s/s, fkc, pre-e s/s All questions were answered Assessment: [redacted]w[redacted]d CHTN, A2/BDM Medication(s) Plans:  Continue glyburide 5mg  BID, procardia xl 30mg  daily, acyclovir tid Treatment Plan:  Has RCS scheduled for Monday 4/30 Follow up 1wk after c/s on 5/7 for incision and bp check

## 2016-12-16 NOTE — Patient Instructions (Signed)
Call the office [phone]) or go to [facility] if:  You begin to have strong, frequent contractions  Your water breaks.  Sometimes it is a big gush of fluid, sometimes it is just a trickle that keeps getting your panties wet or running down your legs  You have vaginal bleeding.  It is normal to have a small amount of spotting if your cervix was checked.   You don't feel your baby moving like normal.  If you don't, get you something to eat and drink and lay down and focus on feeling your baby move.  You should feel at least 10 movements in 2 hours.  If you don't, you should call the office or go to [facility].    Call the office [phone]) or go to [facility] hospital for these signs of pre-eclampsia:  Severe headache that does not go away with Tylenol  Visual changes- seeing spots, double, blurred vision  Pain under your right breast or upper abdomen that does not go away with Tums or heartburn medicine  Nausea and/or vomiting  Severe swelling in your hands, feet, and face      Braxton Hicks Contractions Contractions of the uterus can occur throughout pregnancy, but they are not always a sign that you are in labor. You may have practice contractions called Braxton Hicks contractions. These false labor contractions are sometimes confused with true labor. What are [place] contractions? Braxton Hicks contractions are tightening movements that occur in the muscles of the uterus before labor. Unlike true labor contractions, these contractions do not result in opening (dilation) and thinning of the cervix. Toward the end of pregnancy (32-34 weeks), Braxton Hicks contractions can happen more often and may become stronger. These contractions are sometimes difficult to tell apart from true labor because they can be very uncomfortable. You should not feel embarrassed if you go to the hospital with false labor. Sometimes, the only way to tell if you are in true labor is for your  health care provider to look for changes in the cervix. The health care provider will do a physical exam and may monitor your contractions. If you are not in true labor, the exam should show that your cervix is not dilating and your water has not broken. If there are no prenatal problems or other health problems associated with your pregnancy, it is completely safe for you to be sent home with false labor. You may continue to have Braxton Hicks contractions until you go into true labor. How can I tell the difference between true labor and false labor?  Differences  False labor  Contractions last 30-70 seconds.: Contractions are usually shorter and not as strong as true labor contractions.  Contractions become very regular.: Contractions are usually irregular.  Discomfort is usually felt in the top of the uterus, and it spreads to the lower abdomen and low back.: Contractions are often felt in the front of the lower abdomen and in the groin.  Contractions do not go away with walking.: Contractions may go away when you walk around or change positions while lying down.  Contractions usually become more intense and increase in frequency.: Contractions get weaker and are shorter-lasting as time goes on.  The cervix dilates and gets thinner.: The cervix usually does not dilate or become thin. Follow these instructions at home:  Take over-the-counter and prescription medicines only as told by your health care provider.  Keep up with your usual exercises and follow other instructions from your health  care provider.  Eat and drink lightly if you think you are going into labor.  If Braxton Hicks contractions are making you uncomfortable:  Change your position from lying down or resting to walking, or change from walking to resting.  Sit and rest in a tub of warm water.  Drink enough fluid to keep your urine clear or pale yellow. Dehydration may cause these contractions.  Do slow and deep  breathing several times an hour.  Keep all follow-up prenatal visits as told by your health care provider. This is important. Contact a health care provider if:  You have a fever.  You have continuous pain in your abdomen. Get help right away if:  Your contractions become stronger, more regular, and closer together.  You have fluid leaking or gushing from your vagina.  You pass blood-tinged mucus (bloody show).  You have bleeding from your vagina.  You have low back pain that you never had before.  You feel your baby's head pushing down and causing pelvic pressure.  Your baby is not moving inside you as much as it used to. Summary  Contractions that occur before labor are called Braxton Hicks contractions, false labor, or practice contractions.  Braxton Hicks contractions are usually shorter, weaker, farther apart, and less regular than true labor contractions. True labor contractions usually become progressively stronger and regular and they become more frequent.  Manage discomfort from [facility] contractions by changing position, resting in a warm bath, drinking plenty of water, or practicing deep breathing. This information is not intended to replace advice given to you by your health care provider. Make sure you discuss any questions you have with your health care provider. Document Released: 08/08/2005 Document Revised: 06/27/2016 Document Reviewed: 06/27/2016 Elsevier Interactive Patient Education  2017 [employer].

## 2016-12-17 ENCOUNTER — Encounter (HOSPITAL_COMMUNITY): Payer: Self-pay | Admitting: Anesthesiology

## 2016-12-17 NOTE — Anesthesia Preprocedure Evaluation (Addendum)
Anesthesia Evaluation  Patient identified by MRN, date of birth, ID band Patient awake    Reviewed: Allergy & Precautions, NPO status , Patient's Chart, lab work & pertinent test results  Airway Mallampati: III  TM Distance: >3 FB Neck ROM: Full    Dental  (+) Teeth Intact, Dental Advisory Given   Pulmonary asthma ,    breath sounds clear to auscultation       Cardiovascular hypertension, + Valvular Problems/Murmurs  Rhythm:Regular Rate:Normal     Neuro/Psych  Headaches, negative psych ROS   GI/Hepatic Neg liver ROS, GERD  Medicated,  Endo/Other  negative endocrine ROSdiabetes, Type 2, Oral Hypoglycemic Agents  Renal/GU negative Renal ROS     Musculoskeletal negative musculoskeletal ROS (+)   Abdominal   Peds  Hematology negative hematology ROS (+)   Anesthesia Other Findings Day of surgery medications reviewed with the patient.  Reproductive/Obstetrics (+) Pregnancy                            Anesthesia Physical Anesthesia Plan  ASA: III  Anesthesia Plan: Spinal   Post-op Pain Management:    Induction:   Airway Management Planned: Natural Airway  Additional Equipment:   Intra-op Plan:   Post-operative Plan:   Informed Consent: I have reviewed the patients History and Physical, chart, labs and discussed the procedure including the risks, benefits and alternatives for the proposed anesthesia with the patient or authorized representative who has indicated his/her understanding and acceptance.     Plan Discussed with: CRNA  Anesthesia Plan Comments:        Anesthesia Quick Evaluation

## 2016-12-19 ENCOUNTER — Inpatient Hospital Stay (HOSPITAL_COMMUNITY)
Admission: RE | Admit: 2016-12-19 | Discharge: 2016-12-22 | DRG: 765 | Disposition: A | Discharge status: Home / Self Care | Payer: Managed Care, Other (non HMO) | Source: Ambulatory Visit | Attending: Family Medicine | Admitting: Family Medicine

## 2016-12-19 ENCOUNTER — Encounter (HOSPITAL_COMMUNITY)
Admission: RE | Disposition: A | Discharge status: Home / Self Care | Payer: Self-pay | Source: Ambulatory Visit | Attending: Family Medicine

## 2016-12-19 ENCOUNTER — Inpatient Hospital Stay (HOSPITAL_COMMUNITY): Payer: Managed Care, Other (non HMO) | Admitting: Anesthesiology

## 2016-12-19 ENCOUNTER — Encounter (HOSPITAL_COMMUNITY): Payer: Self-pay

## 2016-12-19 DIAGNOSIS — D649 Anemia, unspecified: Secondary | ICD-10-CM | POA: Diagnosis present

## 2016-12-19 DIAGNOSIS — O34211 Maternal care for low transverse scar from previous cesarean delivery: Principal | ICD-10-CM | POA: Diagnosis present

## 2016-12-19 DIAGNOSIS — A6000 Herpesviral infection of urogenital system, unspecified: Secondary | ICD-10-CM | POA: Diagnosis present

## 2016-12-19 DIAGNOSIS — Z6841 Body Mass Index (BMI) 40.0 and over, adult: Secondary | ICD-10-CM

## 2016-12-19 DIAGNOSIS — O1002 Pre-existing essential hypertension complicating childbirth: Secondary | ICD-10-CM | POA: Diagnosis present

## 2016-12-19 DIAGNOSIS — K219 Gastro-esophageal reflux disease without esophagitis: Secondary | ICD-10-CM | POA: Diagnosis present

## 2016-12-19 DIAGNOSIS — O24425 Gestational diabetes mellitus in childbirth, controlled by oral hypoglycemic drugs: Secondary | ICD-10-CM | POA: Diagnosis present

## 2016-12-19 DIAGNOSIS — O99214 Obesity complicating childbirth: Secondary | ICD-10-CM | POA: Diagnosis present

## 2016-12-19 DIAGNOSIS — Z833 Family history of diabetes mellitus: Secondary | ICD-10-CM | POA: Diagnosis not present

## 2016-12-19 DIAGNOSIS — O1092 Unspecified pre-existing hypertension complicating childbirth: Secondary | ICD-10-CM

## 2016-12-19 DIAGNOSIS — J45909 Unspecified asthma, uncomplicated: Secondary | ICD-10-CM | POA: Diagnosis present

## 2016-12-19 DIAGNOSIS — O9902 Anemia complicating childbirth: Secondary | ICD-10-CM | POA: Diagnosis present

## 2016-12-19 DIAGNOSIS — O9962 Diseases of the digestive system complicating childbirth: Secondary | ICD-10-CM | POA: Diagnosis present

## 2016-12-19 DIAGNOSIS — Z3A39 39 weeks gestation of pregnancy: Secondary | ICD-10-CM

## 2016-12-19 DIAGNOSIS — O9832 Other infections with a predominantly sexual mode of transmission complicating childbirth: Secondary | ICD-10-CM | POA: Diagnosis present

## 2016-12-19 DIAGNOSIS — O9952 Diseases of the respiratory system complicating childbirth: Secondary | ICD-10-CM | POA: Diagnosis present

## 2016-12-19 DIAGNOSIS — Z8249 Family history of ischemic heart disease and other diseases of the circulatory system: Secondary | ICD-10-CM | POA: Diagnosis not present

## 2016-12-19 LAB — GLUCOSE, CAPILLARY
Glucose-Capillary: 62 mg/dL — ABNORMAL LOW (ref 65–99)
Glucose-Capillary: 83 mg/dL (ref 65–99)

## 2016-12-19 LAB — RPR: RPR Ser Ql: REACTIVE — AB

## 2016-12-19 LAB — RPR, QUANT+TP ABS (REFLEX)
Rapid Plasma Reagin, Quant: 1:1 {titer} — ABNORMAL HIGH
T Pallidum Abs: POSITIVE — AB

## 2016-12-19 SURGERY — Surgical Case
Anesthesia: Spinal

## 2016-12-19 MED ORDER — TETANUS-DIPHTH-ACELL PERTUSSIS 5-2.5-18.5 LF-MCG/0.5 IM SUSP: 0.5000 mL | Freq: Once | INTRAMUSCULAR | Status: DC

## 2016-12-19 MED ORDER — SCOPOLAMINE 1 MG/3DAYS TD PT72
1.0000 | MEDICATED_PATCH | Freq: Once | TRANSDERMAL | Status: AC
  Administered 2016-12-19: 1.5 mg via TRANSDERMAL

## 2016-12-19 MED ORDER — FENTANYL CITRATE (PF) 100 MCG/2ML IJ SOLN
INTRAMUSCULAR | Status: AC
  Administered 2016-12-19: 50 ug via INTRAVENOUS
  Filled 2016-12-19: qty 2

## 2016-12-19 MED ORDER — PHENYLEPHRINE 8 MG IN D5W 100 ML (0.08MG/ML) PREMIX OPTIME
INJECTION | INTRAVENOUS | Status: DC | PRN
  Administered 2016-12-19: 30 ug/min via INTRAVENOUS

## 2016-12-19 MED ORDER — OXYTOCIN 10 UNIT/ML IJ SOLN
INTRAMUSCULAR | Status: AC
  Filled 2016-12-19: qty 4

## 2016-12-19 MED ORDER — PRENATAL MULTIVITAMIN CH
1.0000 | ORAL_TABLET | Freq: Every day | ORAL | Status: DC
  Administered 2016-12-20 – 2016-12-21 (×2): 1 via ORAL
  Filled 2016-12-19 (×2): qty 1

## 2016-12-19 MED ORDER — KETOROLAC TROMETHAMINE 30 MG/ML IJ SOLN: 30.0000 mg | Freq: Four times a day (QID) | INTRAMUSCULAR | Status: DC | PRN

## 2016-12-19 MED ORDER — FENTANYL CITRATE (PF) 100 MCG/2ML IJ SOLN
INTRAMUSCULAR | Status: AC
  Filled 2016-12-19: qty 2

## 2016-12-19 MED ORDER — WITCH HAZEL-GLYCERIN EX PADS: 1.0000 "application " | MEDICATED_PAD | CUTANEOUS | Status: DC | PRN

## 2016-12-19 MED ORDER — IBUPROFEN 600 MG PO TABS
600.0000 mg | ORAL_TABLET | Freq: Four times a day (QID) | ORAL | Status: DC
  Administered 2016-12-19 – 2016-12-22 (×11): 600 mg via ORAL
  Filled 2016-12-19 (×11): qty 1

## 2016-12-19 MED ORDER — NALBUPHINE HCL 10 MG/ML IJ SOLN: 5.0000 mg | INTRAMUSCULAR | Status: DC | PRN

## 2016-12-19 MED ORDER — MORPHINE SULFATE (PF) 0.5 MG/ML IJ SOLN
INTRAMUSCULAR | Status: DC | PRN
  Administered 2016-12-19: .2 mg via INTRATHECAL

## 2016-12-19 MED ORDER — DIPHENHYDRAMINE HCL 25 MG PO CAPS
25.0000 mg | ORAL_CAPSULE | Freq: Four times a day (QID) | ORAL | Status: DC | PRN
  Administered 2016-12-20: 25 mg via ORAL

## 2016-12-19 MED ORDER — SENNOSIDES-DOCUSATE SODIUM 8.6-50 MG PO TABS
2.0000 | ORAL_TABLET | ORAL | Status: DC
  Administered 2016-12-20 – 2016-12-22 (×3): 2 via ORAL
  Filled 2016-12-19 (×3): qty 2

## 2016-12-19 MED ORDER — SIMETHICONE 80 MG PO CHEW
80.0000 mg | CHEWABLE_TABLET | ORAL | Status: DC
  Administered 2016-12-20 – 2016-12-22 (×3): 80 mg via ORAL
  Filled 2016-12-19 (×3): qty 1

## 2016-12-19 MED ORDER — ONDANSETRON HCL 4 MG/2ML IJ SOLN
INTRAMUSCULAR | Status: AC
  Filled 2016-12-19: qty 2

## 2016-12-19 MED ORDER — DIPHENHYDRAMINE HCL 50 MG/ML IJ SOLN
12.5000 mg | INTRAMUSCULAR | Status: DC | PRN
  Administered 2016-12-19: 12.5 mg via INTRAVENOUS
  Filled 2016-12-19: qty 1

## 2016-12-19 MED ORDER — PHENYLEPHRINE 8 MG IN D5W 100 ML (0.08MG/ML) PREMIX OPTIME
INJECTION | INTRAVENOUS | Status: AC
  Filled 2016-12-19: qty 100

## 2016-12-19 MED ORDER — NALBUPHINE HCL 10 MG/ML IJ SOLN
5.0000 mg | INTRAMUSCULAR | Status: DC | PRN
  Administered 2016-12-19 (×2): 5 mg via INTRAVENOUS
  Filled 2016-12-19 (×3): qty 1

## 2016-12-19 MED ORDER — SIMETHICONE 80 MG PO CHEW
80.0000 mg | CHEWABLE_TABLET | Freq: Three times a day (TID) | ORAL | Status: DC
  Administered 2016-12-19 – 2016-12-21 (×6): 80 mg via ORAL
  Filled 2016-12-19 (×6): qty 1

## 2016-12-19 MED ORDER — KETOROLAC TROMETHAMINE 30 MG/ML IJ SOLN
INTRAMUSCULAR | Status: AC
  Administered 2016-12-19: 30 mg via INTRAMUSCULAR
  Filled 2016-12-19: qty 1

## 2016-12-19 MED ORDER — FENTANYL CITRATE (PF) 100 MCG/2ML IJ SOLN
25.0000 ug | INTRAMUSCULAR | Status: DC | PRN
  Administered 2016-12-19 (×2): 50 ug via INTRAVENOUS

## 2016-12-19 MED ORDER — MORPHINE SULFATE (PF) 0.5 MG/ML IJ SOLN
INTRAMUSCULAR | Status: AC
  Filled 2016-12-19: qty 10

## 2016-12-19 MED ORDER — LACTATED RINGERS IV SOLN: INTRAVENOUS | Status: DC

## 2016-12-19 MED ORDER — LACTATED RINGERS IV SOLN
INTRAVENOUS | Status: DC
  Administered 2016-12-19 (×2): via INTRAVENOUS

## 2016-12-19 MED ORDER — SCOPOLAMINE 1 MG/3DAYS TD PT72
MEDICATED_PATCH | TRANSDERMAL | Status: AC
  Administered 2016-12-19: 1.5 mg via TRANSDERMAL
  Filled 2016-12-19: qty 1

## 2016-12-19 MED ORDER — LACTATED RINGERS IV SOLN
INTRAVENOUS | Status: DC
  Administered 2016-12-19: 125 mL/h via INTRAVENOUS
  Administered 2016-12-20: 01:00:00 via INTRAVENOUS

## 2016-12-19 MED ORDER — FENTANYL CITRATE (PF) 100 MCG/2ML IJ SOLN
INTRAMUSCULAR | Status: DC | PRN
  Administered 2016-12-19: 10 ug via INTRATHECAL

## 2016-12-19 MED ORDER — KETOROLAC TROMETHAMINE 30 MG/ML IJ SOLN
30.0000 mg | Freq: Four times a day (QID) | INTRAMUSCULAR | Status: DC | PRN
  Administered 2016-12-19: 30 mg via INTRAMUSCULAR

## 2016-12-19 MED ORDER — SODIUM CHLORIDE 0.9 % IR SOLN
Status: DC | PRN
  Administered 2016-12-19: 200 mL

## 2016-12-19 MED ORDER — OXYTOCIN 40 UNITS IN LACTATED RINGERS INFUSION - SIMPLE MED: 2.5000 [IU]/h | INTRAVENOUS | Status: AC

## 2016-12-19 MED ORDER — MEPERIDINE HCL 25 MG/ML IJ SOLN: 6.2500 mg | INTRAMUSCULAR | Status: DC | PRN

## 2016-12-19 MED ORDER — SIMETHICONE 80 MG PO CHEW: 80.0000 mg | CHEWABLE_TABLET | ORAL | Status: DC | PRN

## 2016-12-19 MED ORDER — DIBUCAINE 1 % RE OINT: 1.0000 "application " | TOPICAL_OINTMENT | RECTAL | Status: DC | PRN

## 2016-12-19 MED ORDER — BUPIVACAINE IN DEXTROSE 0.75-8.25 % IT SOLN
INTRATHECAL | Status: AC
  Filled 2016-12-19: qty 2

## 2016-12-19 MED ORDER — AMLODIPINE BESYLATE 5 MG PO TABS
5.0000 mg | ORAL_TABLET | Freq: Every day | ORAL | Status: DC
  Filled 2016-12-19 (×2): qty 1

## 2016-12-19 MED ORDER — ONDANSETRON HCL 4 MG/2ML IJ SOLN
INTRAMUSCULAR | Status: DC | PRN
  Administered 2016-12-19: 4 mg via INTRAVENOUS

## 2016-12-19 MED ORDER — NALBUPHINE HCL 10 MG/ML IJ SOLN: 5.0000 mg | Freq: Once | INTRAMUSCULAR | Status: AC | PRN

## 2016-12-19 MED ORDER — MENTHOL 3 MG MT LOZG: 1.0000 | LOZENGE | OROMUCOSAL | Status: DC | PRN

## 2016-12-19 MED ORDER — ZOLPIDEM TARTRATE 5 MG PO TABS: 5.0000 mg | ORAL_TABLET | Freq: Every evening | ORAL | Status: DC | PRN

## 2016-12-19 MED ORDER — DIPHENHYDRAMINE HCL 25 MG PO CAPS
25.0000 mg | ORAL_CAPSULE | ORAL | Status: DC | PRN
  Filled 2016-12-19: qty 1

## 2016-12-19 MED ORDER — COCONUT OIL OIL: 1.0000 "application " | TOPICAL_OIL | Status: DC | PRN

## 2016-12-19 MED ORDER — PROMETHAZINE HCL 25 MG/ML IJ SOLN: 6.2500 mg | INTRAMUSCULAR | Status: DC | PRN

## 2016-12-19 MED ORDER — SOD CITRATE-CITRIC ACID 500-334 MG/5ML PO SOLN
30.0000 mL | Freq: Once | ORAL | Status: AC
Start: 2016-12-19 — End: 2016-12-19
  Administered 2016-12-19: 30 mL via ORAL
  Filled 2016-12-19: qty 15

## 2016-12-19 MED ORDER — BUPIVACAINE IN DEXTROSE 0.75-8.25 % IT SOLN
INTRATHECAL | Status: DC | PRN
  Administered 2016-12-19: 1.5 mL via INTRATHECAL

## 2016-12-19 MED ORDER — ACETAMINOPHEN 325 MG PO TABS
650.0000 mg | ORAL_TABLET | ORAL | Status: DC | PRN
  Administered 2016-12-19 – 2016-12-22 (×11): 650 mg via ORAL
  Filled 2016-12-19 (×12): qty 2

## 2016-12-19 MED ORDER — EPHEDRINE SULFATE 50 MG/ML IJ SOLN
INTRAMUSCULAR | Status: DC | PRN
  Administered 2016-12-19: 10 mg via INTRAVENOUS

## 2016-12-19 MED ORDER — NALBUPHINE HCL 10 MG/ML IJ SOLN
5.0000 mg | Freq: Once | INTRAMUSCULAR | Status: AC | PRN
  Administered 2016-12-20: 5 mg via INTRAVENOUS

## 2016-12-19 MED ORDER — CEFAZOLIN SODIUM-DEXTROSE 2-4 GM/100ML-% IV SOLN
2.0000 g | INTRAVENOUS | Status: AC
  Administered 2016-12-19: 2 g via INTRAVENOUS
  Filled 2016-12-19: qty 100

## 2016-12-19 MED ORDER — OXYTOCIN 10 UNIT/ML IJ SOLN
INTRAMUSCULAR | Status: DC | PRN
  Administered 2016-12-19: 40 [IU] via INTRAVENOUS

## 2016-12-19 MED ORDER — NALOXONE HCL 0.4 MG/ML IJ SOLN: 0.4000 mg | INTRAMUSCULAR | Status: DC | PRN

## 2016-12-19 MED ORDER — LACTATED RINGERS IV SOLN
INTRAVENOUS | Status: DC | PRN
Start: 2016-12-19 — End: 2016-12-19
  Administered 2016-12-19: 09:00:00 via INTRAVENOUS

## 2016-12-19 MED ORDER — SODIUM CHLORIDE 0.9% FLUSH: 3.0000 mL | INTRAVENOUS | Status: DC | PRN

## 2016-12-19 MED ORDER — NALOXONE HCL 2 MG/2ML IJ SOSY
1.0000 ug/kg/h | PREFILLED_SYRINGE | INTRAVENOUS | Status: DC | PRN
  Filled 2016-12-19: qty 2

## 2016-12-19 MED ORDER — ONDANSETRON HCL 4 MG/2ML IJ SOLN: 4.0000 mg | Freq: Three times a day (TID) | INTRAMUSCULAR | Status: DC | PRN

## 2016-12-19 MED ORDER — AMLODIPINE BESYLATE 10 MG PO TABS
10.0000 mg | ORAL_TABLET | Freq: Every day | ORAL | Status: DC
  Filled 2016-12-19 (×2): qty 1

## 2016-12-19 SURGICAL SUPPLY — 43 items
ADH SKN CLS APL DERMABOND .7 (GAUZE/BANDAGES/DRESSINGS) ×2
CHLORAPREP W/TINT 26ML (MISCELLANEOUS) ×4 IMPLANT
CLAMP CORD UMBIL (MISCELLANEOUS) IMPLANT
CLOTH BEACON ORANGE TIMEOUT ST (SAFETY) ×2 IMPLANT
DERMABOND ADVANCED (GAUZE/BANDAGES/DRESSINGS) ×2
DERMABOND ADVANCED .7 DNX12 (GAUZE/BANDAGES/DRESSINGS) ×2 IMPLANT
DRSG OPSITE POSTOP 4X10 (GAUZE/BANDAGES/DRESSINGS) ×2 IMPLANT
ELECT REM PT RETURN 9FT ADLT (ELECTROSURGICAL) ×2
ELECTRODE REM PT RTRN 9FT ADLT (ELECTROSURGICAL) ×1 IMPLANT
EXTRACTOR VACUUM BELL STYLE (SUCTIONS) IMPLANT
GAUZE SPONGE 4X4 12PLY STRL LF (GAUZE/BANDAGES/DRESSINGS) ×2 IMPLANT
GLOVE BIOGEL PI IND STRL 7.0 (GLOVE) ×1 IMPLANT
GLOVE BIOGEL PI IND STRL 8 (GLOVE) ×1 IMPLANT
GLOVE BIOGEL PI INDICATOR 7.0 (GLOVE) ×1
GLOVE BIOGEL PI INDICATOR 8 (GLOVE) ×1
GLOVE ECLIPSE 8.0 STRL XLNG CF (GLOVE) ×2 IMPLANT
GOWN STRL REUS W/TWL LRG LVL3 (GOWN DISPOSABLE) ×4 IMPLANT
KIT ABG SYR 3ML LUER SLIP (SYRINGE) ×2 IMPLANT
NDL HYPO 18GX1.5 BLUNT FILL (NEEDLE) ×1 IMPLANT
NDL HYPO 25X5/8 SAFETYGLIDE (NEEDLE) ×1 IMPLANT
NEEDLE HYPO 18GX1.5 BLUNT FILL (NEEDLE) ×2 IMPLANT
NEEDLE HYPO 22GX1.5 SAFETY (NEEDLE) ×2 IMPLANT
NEEDLE HYPO 25X5/8 SAFETYGLIDE (NEEDLE) ×2 IMPLANT
NS IRRIG 1000ML POUR BTL (IV SOLUTION) ×2 IMPLANT
PACK C SECTION WH (CUSTOM PROCEDURE TRAY) ×2 IMPLANT
PAD ABD 7.5X8 STRL (GAUZE/BANDAGES/DRESSINGS) ×1 IMPLANT
PAD OB MATERNITY 4.3X12.25 (PERSONAL CARE ITEMS) ×2 IMPLANT
PENCIL SMOKE EVAC W/HOLSTER (ELECTROSURGICAL) ×2 IMPLANT
RETRACTOR TRAXI PANNICULUS (MISCELLANEOUS) IMPLANT
RTRCTR C-SECT PINK 25CM LRG (MISCELLANEOUS) IMPLANT
SUT CHROMIC 0 CT 1 (SUTURE) ×2 IMPLANT
SUT MNCRL 0 VIOLET CTX 36 (SUTURE) ×2 IMPLANT
SUT MONOCRYL 0 CTX 36 (SUTURE) ×2
SUT PLAIN 2 0 (SUTURE) ×2
SUT PLAIN 2 0 XLH (SUTURE) IMPLANT
SUT PLAIN ABS 2-0 CT1 27XMFL (SUTURE) IMPLANT
SUT VIC AB 0 CTX 36 (SUTURE) ×2
SUT VIC AB 0 CTX36XBRD ANBCTRL (SUTURE) ×1 IMPLANT
SUT VIC AB 4-0 KS 27 (SUTURE) IMPLANT
SYR 20CC LL (SYRINGE) ×4 IMPLANT
TOWEL OR 17X24 6PK STRL BLUE (TOWEL DISPOSABLE) ×2 IMPLANT
TRAXI PANNICULUS RETRACTOR (MISCELLANEOUS) ×1
TRAY FOLEY BAG SILVER LF 14FR (SET/KITS/TRAYS/PACK) IMPLANT

## 2016-12-19 NOTE — Progress Notes (Signed)
C.Neill notified that pt's blood pressure 124/65-dose of norvasc held

## 2016-12-19 NOTE — Anesthesia Procedure Notes (Addendum)
Spinal  Patient location during procedure: OR Start time: 12/19/2016 8:58 AM End time: 12/19/2016 9:01 AM Staffing Anesthesiologist: Suella Broad D Performed: anesthesiologist  Preanesthetic Checklist Completed: patient identified, site marked, surgical consent, pre-op evaluation, timeout performed, IV checked, risks and benefits discussed and monitors and equipment checked Spinal Block Patient position: sitting Prep: Betadine Patient monitoring: heart rate, continuous pulse ox, blood pressure and cardiac monitor Approach: midline Location: L4-5 Injection technique: single-shot Needle Needle type: Introducer and Sprotte  Needle gauge: 24 G Needle length: 9 cm Additional Notes Negative paresthesia. Negative blood return. Positive free-flowing CSF. Expiration date of kit checked and confirmed. Patient tolerated procedure well, without complications.

## 2016-12-19 NOTE — Anesthesia Postprocedure Evaluation (Addendum)
Anesthesia Post Note  Patient: Tina Wall  Procedure(s) Performed: Procedure(s) (LRB): REPEAT CESAREAN SECTION (N/A)  Patient location during evaluation: Mother Baby Anesthesia Type: Spinal Level of consciousness: awake and alert and oriented Pain management: satisfactory to patient Vital Signs Assessment: post-procedure vital signs reviewed and stable Respiratory status: spontaneous breathing and nonlabored ventilation Cardiovascular status: stable Postop Assessment: no headache, no backache, patient able to bend at knees, no signs of nausea or vomiting and adequate PO intake Anesthetic complications: no        Last Vitals:  Vitals:   12/19/16 1230 12/19/16 1335  BP: 125/70 130/72  Pulse: (!) 57 63  Resp: 18 18  Temp: 36.5 C 36.7 C    Last Pain:  Vitals:   12/19/16 1335  TempSrc:   PainSc: 5    Pain Goal:                 GREGORY,SUZANNE

## 2016-12-19 NOTE — Anesthesia Postprocedure Evaluation (Signed)
Anesthesia Post Note  Patient: Tina Wall  Procedure(s) Performed: Procedure(s) (LRB): REPEAT CESAREAN SECTION (N/A)  Patient location during evaluation: PACU Anesthesia Type: Spinal Level of consciousness: oriented and awake and alert Pain management: pain level controlled Vital Signs Assessment: post-procedure vital signs reviewed and stable Respiratory status: spontaneous breathing, respiratory function stable and patient connected to nasal cannula oxygen Cardiovascular status: blood pressure returned to baseline and stable Postop Assessment: no headache, no backache and spinal receding Anesthetic complications: no        Last Vitals:  Vitals:   12/19/16 1045 12/19/16 1100  BP: (!) 142/78 116/71  Pulse: 72 (!) 57  Resp: 15 18  Temp:  36.6 C    Last Pain:  Vitals:   12/19/16 1105  TempSrc:   PainSc: 6    Pain Goal:                 Effie Berkshire

## 2016-12-19 NOTE — Addendum Note (Signed)
Addendum  created 12/19/16 1407 by Jonna Munro, CRNA   Sign clinical note

## 2016-12-19 NOTE — Lactation Note (Signed)
This note was copied from a baby's chart. Lactation Consultation Note  Patient Name: Tina Wall [id number] Date: 12/19/2016 Reason for consult: Initial assessment Breastfeeding consultation services and support information given.  Baby is wrapped in blanket and being held by FOB. Baby is 5 hours old and jittery.  FOB states baby has been showing some feeding cues.  Baby positioned skin to skin in football hold.  Taught mom hand expression but no colostrum seen.  Baby latched easily and nursed actively.  Mom shown how to use breast massage for better flow of milk.  Instructed to feed with any feeding cue and call with concerns/assist.  Maternal Data Has patient been taught Hand Expression?: Yes Does the patient have breastfeeding experience prior to this delivery?: Yes  Feeding Feeding Type: Breast Fed Length of feed: 3 min  LATCH Score/Interventions Latch: Grasps breast easily, tongue down, lips flanged, rhythmical sucking. Intervention(s): Adjust position;Assist with latch;Breast massage;Breast compression  Audible Swallowing: A few with stimulation Intervention(s): Hand expression;Skin to skin;Alternate breast massage  Type of Nipple: Everted at rest and after stimulation  Comfort (Breast/Nipple): Soft / non-tender     Hold (Positioning): Assistance needed to correctly position infant at breast and maintain latch. Intervention(s): Breastfeeding basics reviewed;Support Pillows;Position options;Skin to skin  LATCH Score: 8  Lactation Tools Discussed/Used     Consult Status Consult Status: Follow-up Date: 12/20/16 Follow-up type: In-patient    Ave Filter 12/19/2016, 2:59 PM

## 2016-12-19 NOTE — H&P (Signed)
Preoperative History and Physical  Tina Wall is a 36 y.o. [phone] with Patient's last menstrual period was 03/20/2016 (exact date). admitted for a repeat Caesarean section.  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 Asthma   . Bronchitis   . Diabetes mellitus without complication ([place])   . Gestational diabetes   . GI bleeding   . Headache   . Heart murmur   . HSV-2 (herpes simplex virus 2) infection   . Hx of chlamydia infection   . Hx of trichomoniasis   . Hypertension   . Ovarian cyst   . Syphilis     PSH:     Past Surgical History:  Procedure Laterality Date  . CESAREAN SECTION     C/S x 2  . FOOT SURGERY    . TONSILLECTOMY    . WISDOM TOOTH EXTRACTION      POb/GynH:      OB History    Gravida Para Term Preterm AB Living   4 3 3     3    SAB TAB Ectopic Multiple Live Births           3      SH:   Social History  Substance Use Topics  . Smoking status: Never Smoker  . Smokeless tobacco: Never Used  . Alcohol use No     Comment: occasionally    FH:    Family History  Problem Relation Age of Onset  . Hypertension Mother   . Diabetes Mother   . Kidney disease Mother   . Cancer Father     trachea   . Alzheimer's disease Paternal Grandmother      Allergies:  Allergies  Allergen Reactions  . Ace Inhibitors Shortness Of Breath    Short of breath  . Latex Other (See Comments)    Causes irritation.    Medications:       Current Facility-Administered Medications:  .  ceFAZolin (ANCEF) IVPB 2g/100 mL premix, 2 g, Intravenous, On Call to OR, Florian Buff, MD .  lactated ringers infusion, , Intravenous, Continuous, Effie Berkshire, MD .  sodium citrate-citric acid (ORACIT) solution 30 mL, 30 mL, Oral, Once, Effie Berkshire, MD  Review of  Systems:   Review of Systems  Constitutional: Negative for fever, chills, weight loss, malaise/fatigue and diaphoresis.  HENT: Negative for hearing loss, ear pain, nosebleeds, congestion, sore throat, neck pain, tinnitus and ear discharge.   Eyes: Negative for blurred vision, double vision, photophobia, pain, discharge and redness.  Respiratory: Negative for cough, hemoptysis, sputum production, shortness of breath, wheezing and stridor.   Cardiovascular: Negative for chest pain, palpitations, orthopnea, claudication, leg swelling and PND.  Gastrointestinal: Positive for abdominal pain. Negative for heartburn, nausea, vomiting, diarrhea, constipation, blood in stool and melena.  Genitourinary: Negative for dysuria, urgency, frequency, hematuria and flank pain.  Musculoskeletal: Negative for myalgias, back pain, joint pain and falls.  Skin: Negative for itching and rash.  Neurological: Negative for dizziness, tingling, tremors, sensory change, speech change, focal weakness, seizures, loss of consciousness, weakness and headaches.  Endo/Heme/Allergies: Negative for environmental allergies and polydipsia. Does not bruise/bleed easily.  Psychiatric/Behavioral: Negative for depression, suicidal ideas, hallucinations, memory loss and substance abuse. The patient is not nervous/anxious and does not have insomnia.  PHYSICAL EXAM:  Blood pressure (!) 148/82, pulse 98, temperature 98.4 F (36.9 C), temperature source Oral, resp. rate 20, height 5\' 1"  (1.549 m), weight 238 lb (108 kg), last menstrual period 03/20/2016.    Vitals reviewed. Constitutional: She is oriented to person, place, and time. She appears well-developed and well-nourished.  HENT:  Head: Normocephalic and atraumatic.  Right Ear: External ear normal.  Left Ear: External ear normal.  Nose: Nose normal.  Mouth/Throat: Oropharynx is clear and moist.  Eyes: Conjunctivae and EOM are normal. Pupils are equal, round, and reactive  to light. Right eye exhibits no discharge. Left eye exhibits no discharge. No scleral icterus.  Neck: Normal range of motion. Neck supple. No tracheal deviation present. No thyromegaly present.  Cardiovascular: Normal rate, regular rhythm, normal heart sounds and intact distal pulses.  Exam reveals no gallop and no friction rub.   No murmur heard. Respiratory: Effort normal and breath sounds normal. No respiratory distress. She has no wheezes. She has no rales. She exhibits no tenderness.  GI: Soft. Bowel sounds are normal. She exhibits no distension and no mass. There is tenderness. There is no rebound and no guarding.  Genitourinary:       Vulva is normal without lesions Vagina is pink moist without discharge Cervix normal in appearance and pap is normal Uterus is size equals dates Adnexa is negative with normal sized ovaries by sonogram  Musculoskeletal: Normal range of motion. She exhibits no edema and no tenderness.  Neurological: She is alert and oriented to person, place, and time. She has normal reflexes. She displays normal reflexes. No cranial nerve deficit. She exhibits normal muscle tone. Coordination normal.  Skin: Skin is warm and dry. No rash noted. No erythema. No pallor.  Psychiatric: She has a normal mood and affect. Her behavior is normal. Judgment and thought content normal.    Labs: Results for orders placed or performed during the hospital encounter of 12/19/16 (from the past 336 hour(s))  Glucose, capillary   Collection Time: 12/19/16  7:07 AM  Result Value Ref Range   Glucose-Capillary 62 (L) 65 - 99 mg/dL  Results for orders placed or performed during the hospital encounter of 12/16/16 (from the past 336 hour(s))  CBC   Collection Time: 12/16/16  1:30 PM  Result Value Ref Range   WBC 4.6 4.0 - 10.5 K/uL   RBC 3.73 (L) 3.87 - 5.11 MIL/uL   Hemoglobin 11.5 (L) 12.0 - 15.0 g/dL   HCT 34.4 (L) 36.0 - 46.0 %   MCV 92.2 78.0 - 100.0 fL   MCH 30.8 26.0 - 34.0 pg    MCHC 33.4 30.0 - 36.0 g/dL   RDW 14.3 11.5 - 15.5 %   Platelets 194 150 - 400 K/uL  Comprehensive metabolic panel   Collection Time: 12/16/16  1:30 PM  Result Value Ref Range   Sodium 135 135 - 145 mmol/L   Potassium 3.1 (L) 3.5 - 5.1 mmol/L   Chloride 106 101 - 111 mmol/L   CO2 21 (L) 22 - 32 mmol/L   Glucose, Bld 89 65 - 99 mg/dL   BUN 9 6 - 20 mg/dL   Creatinine, Ser 0.66 0.44 - 1.00 mg/dL   Calcium 9.2 8.9 - 10.3 mg/dL   Total Protein 7.0 6.5 - 8.1 g/dL   Albumin 3.3 (L) 3.5 - 5.0 g/dL   AST 19 15 - 41 U/L   ALT 12 (L) 14 - 54 U/L   Alkaline Phosphatase 131 (H) 38 -  126 U/L   Total Bilirubin 0.5 0.3 - 1.2 mg/dL   GFR calc non Af Amer >60 >60 mL/min   GFR calc Af Amer >60 >60 mL/min   Anion gap 8 5 - 15  Type and screen   Collection Time: 12/16/16  1:30 PM  Result Value Ref Range   ABO/RH(D) O POS    Antibody Screen NEG    Sample Expiration 12/19/2016   ABO/Rh   Collection Time: 12/16/16  1:30 PM  Result Value Ref Range   ABO/RH(D) O POS   Results for orders placed or performed in visit on 12/16/16 (from the past 336 hour(s))  POCT urinalysis dipstick   Collection Time: 12/16/16 11:46 AM  Result Value Ref Range   Color, UA     Clarity, UA     Glucose, UA neg    Bilirubin, UA     Ketones, UA neg    Spec Grav, UA  1.010 - 1.025   Blood, UA neg    pH, UA  5.0 - 8.0   Protein, UA neg    Urobilinogen, UA  0.2 or 1.0 E.U./dL   Nitrite, UA neg    Leukocytes, UA Negative Negative  Results for orders placed or performed in visit on 12/13/16 (from the past 336 hour(s))  POCT urinalysis dipstick   Collection Time: 12/13/16 11:30 AM  Result Value Ref Range   Color, UA     Clarity, UA     Glucose, UA neg    Bilirubin, UA     Ketones, UA neg    Spec Grav, UA  1.010 - 1.025   Blood, UA neg    pH, UA  5.0 - 8.0   Protein, UA 1    Urobilinogen, UA  0.2 or 1.0 E.U./dL   Nitrite, UA neg    Leukocytes, UA Negative Negative  Protein / Creatinine Ratio, Urine    Collection Time: 12/13/16 12:00 PM  Result Value Ref Range   Creatinine, Urine 217.4 Not Estab. mg/dL   Protein, Ur 26.4 Not Estab. mg/dL   Protein/Creat Ratio 121 0 - 200 mg/g creat  Results for orders placed or performed in visit on 12/09/16 (from the past 336 hour(s))  POCT urinalysis dipstick   Collection Time: 12/09/16  1:15 PM  Result Value Ref Range   Color, UA     Clarity, UA     Glucose, UA neg    Bilirubin, UA     Ketones, UA neg    Spec Grav, UA  1.010 - 1.025   Blood, UA neg    pH, UA  5.0 - 8.0   Protein, UA trace    Urobilinogen, UA  0.2 or 1.0 E.U./dL   Nitrite, UA neg    Leukocytes, UA Negative Negative  Results for orders placed or performed during the hospital encounter of 12/07/16 (from the past 336 hour(s))  Protein / creatinine ratio, urine   Collection Time: 12/07/16  5:06 PM  Result Value Ref Range   Creatinine, Urine 73.00 mg/dL   Total Protein, Urine 7 mg/dL   Protein Creatinine Ratio 0.10 0.00 - 0.15 mg/mg[Cre]  Comprehensive metabolic panel   Collection Time: 12/07/16  5:33 PM  Result Value Ref Range   Sodium 135 135 - 145 mmol/L   Potassium 3.1 (L) 3.5 - 5.1 mmol/L   Chloride 104 101 - 111 mmol/L   CO2 22 22 - 32 mmol/L   Glucose, Bld 63 (L) 65 - 99 mg/dL   BUN 6  6 - 20 mg/dL   Creatinine, Ser 0.63 0.44 - 1.00 mg/dL   Calcium 8.9 8.9 - 10.3 mg/dL   Total Protein 7.5 6.5 - 8.1 g/dL   Albumin 3.5 3.5 - 5.0 g/dL   AST 21 15 - 41 U/L   ALT 15 14 - 54 U/L   Alkaline Phosphatase 122 38 - 126 U/L   Total Bilirubin 0.6 0.3 - 1.2 mg/dL   GFR calc non Af Amer >60 >60 mL/min   GFR calc Af Amer >60 >60 mL/min   Anion gap 9 5 - 15  CBC   Collection Time: 12/07/16  5:33 PM  Result Value Ref Range   WBC 5.5 4.0 - 10.5 K/uL   RBC 3.85 (L) 3.87 - 5.11 MIL/uL   Hemoglobin 11.8 (L) 12.0 - 15.0 g/dL   HCT 35.8 (L) 36.0 - 46.0 %   MCV 93.0 78.0 - 100.0 fL   MCH 30.6 26.0 - 34.0 pg   MCHC 33.0 30.0 - 36.0 g/dL   RDW 14.5 11.5 - 15.5 %   Platelets 175  150 - 400 K/uL  Results for orders placed or performed in visit on 12/07/16 (from the past 336 hour(s))  POCT urinalysis dipstick   Collection Time: 12/07/16  3:37 PM  Result Value Ref Range   Color, UA     Clarity, UA     Glucose, UA neg    Bilirubin, UA     Ketones, UA neg    Spec Grav, UA  1.010 - 1.025   Blood, UA neg    pH, UA  5.0 - 8.0   Protein, UA neg    Urobilinogen, UA  0.2 or 1.0 E.U./dL   Nitrite, UA neg    Leukocytes, UA Negative Negative  Results for orders placed or performed in visit on 12/06/16 (from the past 336 hour(s))  POCT urinalysis dipstick   Collection Time: 12/06/16  1:51 PM  Result Value Ref Range   Color, UA     Clarity, UA     Glucose, UA neg    Bilirubin, UA     Ketones, UA neg    Spec Grav, UA  1.010 - 1.025   Blood, UA neg    pH, UA  5.0 - 8.0   Protein, UA trace    Urobilinogen, UA  0.2 or 1.0 E.U./dL   Nitrite, UA neg    Leukocytes, UA Negative Negative  Results for orders placed or performed in visit on 12/02/16 (from the past 336 hour(s))  GC/Chlamydia Probe Amp   Collection Time: 12/05/16  1:30 PM  Result Value Ref Range   Chlamydia trachomatis, NAA Negative Negative   Neisseria gonorrhoeae by PCR Negative Negative  Culture, beta strep (group b only)   Collection Time: 12/05/16  1:30 PM  Result Value Ref Range   Strep Gp B Culture Positive (A) Negative    EKG: No orders found for this or any previous visit.  Imaging Studies: US Ob Follow Up  Result Date: 11/22/2016 FOLLOW UP SONOGRAM TASHAWNDA BLEILER is in the office for a follow up sonogram for EFW,BPP and cord doppler. She is a 36 y.o. year old [id number] with Estimated Date of Delivery: 12/25/16 by LMP now at  [redacted]w[redacted]d weeks gestation. Thus far the pregnancy has been complicated by chtn,gdm. GESTATION: SINGLETON PRESENTATION: cephalic FETAL ACTIVITY:          Heart rate         148  The fetus is active. AMNIOTIC FLUID: The amniotic fluid volume is  normal, 10 cm. PLACENTA  LOCALIZATION:  posterior GRADE 2 CERVIX: Limited view ADNEXA: wnl GESTATIONAL AGE AND  BIOMETRICS: Gestational criteria: Estimated Date of Delivery: 12/25/16 by LMP now at [redacted]w[redacted]d Previous Scans:8          BIPARIETAL DIAMETER           8.83 cm         35+4 weeks HEAD CIRCUMFERENCE           31.91 cm         35+6 weeks ABDOMINAL CIRCUMFERENCE           33.47 cm         37+2 weeks   96% FEMUR LENGTH           7.16 cm         36+4 weeks                                                       AVERAGE EGA(BY THIS SCAN):  36+2 weeks                                                 ESTIMATED FETAL WEIGHT:       3047  grams, 74 % BIOPHYSCIAL PROFILE:                                                                                                      COMMENTS GROSS BODY MOVEMENT                 2  TONE                2  RESPIRATIONS                2  AMNIOTIC FLUID                2                                                          SCORE:  8/8 (Note: NST was not performed as part of this antepartum testing) DOPPLER FLOW STUDIES: UMBILICAL ARTERY RI RATIOS:   0.62, 0.68, 82% ANATOMICAL SURVEY  COMMENTS CEREBRAL VENTRICLES yes normal  CHOROID PLEXUS yes normal  CEREBELLUM yes normal                          4 CHAMBERED HEART yes normal      DIAPHRAGM yes normal  STOMACH yes normal  RENAL REGION yes normal  BLADDER yes normal      3 VESSEL CORD yes normal              GENITALIA yes normal female     SUSPECTED ABNORMALITIES:  AC 96% QUALITY OF SCAN: satisfactory TECHNICIAN COMMENTS: [place] 53+6 wks,cephalic,fhr 644 bpm,BPP 0/3,KVQQ pl gr 2,bilat adnexa's wnl,afi 10 cm,RI .62,.68 82%,EFW 3047 g 74%,AC 96% A copy of this report including all images has been saved and backed up to a second source for retrieval if needed. All measures and details of the anatomical scan, placentation, fluid volume and pelvic anatomy are contained in that report. Amber Heide Guile 11/22/2016  4:06 PM Clinical Impression and recommendations: I have reviewed the sonogram results above, combined with the patient's current clinical course, below are my impressions and any appropriate recommendations for management based on the sonographic findings. 1.  [id number] Estimated Date of Delivery: 12/25/16 by serial sonographic evaluations 2.  Fetal sonographic surveillance findings: a). Normal fluid volume b). Normal antepartum fetal assessment with BPP 8/8 c). Normal fetal Doppler ratios with consistent diastolic flow , 56%EPPI d). Normal growth percentile with appropriate interval growth, 74%tule 3.  Normal general sonographic findings Recommend continued prenatal evaluations and care based on this sonogram and as clinically indicated from the patient's clinical course. Florian Buff 11/22/2016 4:15 PM   US Fetal Bpp Wo Non Stress  Result Date: 11/22/2016 FOLLOW UP SONOGRAM LUGENE HITT is in the office for a follow up sonogram for EFW,BPP and cord doppler. She is a 36 y.o. year old [id number] with Estimated Date of Delivery: 12/25/16 by LMP now at  [redacted]w[redacted]d weeks gestation. Thus far the pregnancy has been complicated by chtn,gdm. GESTATION: SINGLETON PRESENTATION: cephalic FETAL ACTIVITY:          Heart rate         148          The fetus is active. AMNIOTIC FLUID: The amniotic fluid volume is  normal, 10 cm. PLACENTA LOCALIZATION:  posterior GRADE 2 CERVIX: Limited view ADNEXA: wnl GESTATIONAL AGE AND  BIOMETRICS: Gestational criteria: Estimated Date of Delivery: 12/25/16 by LMP now at [redacted]w[redacted]d Previous Scans:8          BIPARIETAL DIAMETER           8.83 cm         35+4 weeks HEAD CIRCUMFERENCE           31.91 cm         35+6 weeks ABDOMINAL CIRCUMFERENCE           33.47 cm         37+2 weeks   96% FEMUR LENGTH           7.16 cm         36+4 weeks                                                       AVERAGE EGA(BY THIS SCAN):  36+2 weeks  ESTIMATED FETAL WEIGHT:       3047   grams, 74 % BIOPHYSCIAL PROFILE:                                                                                                      COMMENTS GROSS BODY MOVEMENT                 2  TONE                2  RESPIRATIONS                2  AMNIOTIC FLUID                2                                                          SCORE:  8/8 (Note: NST was not performed as part of this antepartum testing) DOPPLER FLOW STUDIES: UMBILICAL ARTERY RI RATIOS:   0.62, 0.68, 82% ANATOMICAL SURVEY                                                                            COMMENTS CEREBRAL VENTRICLES yes normal  CHOROID PLEXUS yes normal  CEREBELLUM yes normal                          4 CHAMBERED HEART yes normal      DIAPHRAGM yes normal  STOMACH yes normal  RENAL REGION yes normal  BLADDER yes normal      3 VESSEL CORD yes normal              GENITALIA yes normal female     SUSPECTED ABNORMALITIES:  AC 96% QUALITY OF SCAN: satisfactory TECHNICIAN COMMENTS: [place] 03+5 wks,cephalic,fhr 009 bpm,BPP 3/8,HWEX pl gr 2,bilat adnexa's wnl,afi 10 cm,RI .62,.68 82%,EFW 3047 g 74%,AC 96% A copy of this report including all images has been saved and backed up to a second source for retrieval if needed. All measures and details of the anatomical scan, placentation, fluid volume and pelvic anatomy are contained in that report. Amber Heide Guile 11/22/2016 4:06 PM Clinical Impression and recommendations: I have reviewed the sonogram results above, combined with the patient's current clinical course, below are my impressions and any appropriate recommendations for management based on the sonographic findings. 1.  [id number] Estimated Date of Delivery: 12/25/16 by serial sonographic evaluations 2.  Fetal sonographic surveillance findings: a). Normal fluid volume b). Normal antepartum fetal assessment with BPP 8/8 c). Normal fetal Doppler ratios with consistent diastolic flow ,  82%tile d). Normal growth percentile with appropriate interval growth, 74%tule 3.   Normal general sonographic findings Recommend continued prenatal evaluations and care based on this sonogram and as clinically indicated from the patient's clinical course. Florian Buff 11/22/2016 4:15 PM   [place] Ua Cord Doppler  Result Date: 11/22/2016 FOLLOW UP SONOGRAM VARA MAIRENA is in the office for a follow up sonogram for EFW,BPP and cord doppler. She is a 36 y.o. year old [id number] with Estimated Date of Delivery: 12/25/16 by LMP now at  [redacted]w[redacted]d weeks gestation. Thus far the pregnancy has been complicated by chtn,gdm. GESTATION: SINGLETON PRESENTATION: cephalic FETAL ACTIVITY:          Heart rate         148          The fetus is active. AMNIOTIC FLUID: The amniotic fluid volume is  normal, 10 cm. PLACENTA LOCALIZATION:  posterior GRADE 2 CERVIX: Limited view ADNEXA: wnl GESTATIONAL AGE AND  BIOMETRICS: Gestational criteria: Estimated Date of Delivery: 12/25/16 by LMP now at [redacted]w[redacted]d Previous Scans:8          BIPARIETAL DIAMETER           8.83 cm         35+4 weeks HEAD CIRCUMFERENCE           31.91 cm         35+6 weeks ABDOMINAL CIRCUMFERENCE           33.47 cm         37+2 weeks   96% FEMUR LENGTH           7.16 cm         36+4 weeks                                                       AVERAGE EGA(BY THIS SCAN):  36+2 weeks                                                 ESTIMATED FETAL WEIGHT:       3047  grams, 74 % BIOPHYSCIAL PROFILE:                                                                                                      COMMENTS GROSS BODY MOVEMENT                 2  TONE                2  RESPIRATIONS                2  AMNIOTIC FLUID                2  SCORE:  8/8 (Note: NST was not performed as part of this antepartum testing) DOPPLER FLOW STUDIES: UMBILICAL ARTERY RI RATIOS:   0.62, 0.68, 82% ANATOMICAL SURVEY                                                                            COMMENTS CEREBRAL VENTRICLES yes normal  CHOROID  PLEXUS yes normal  CEREBELLUM yes normal                          4 CHAMBERED HEART yes normal      DIAPHRAGM yes normal  STOMACH yes normal  RENAL REGION yes normal  BLADDER yes normal      3 VESSEL CORD yes normal              GENITALIA yes normal female     SUSPECTED ABNORMALITIES:  AC 96% QUALITY OF SCAN: satisfactory TECHNICIAN COMMENTS: [place] 13+0 wks,cephalic,fhr 865 bpm,BPP 7/8,IONG pl gr 2,bilat adnexa's wnl,afi 10 cm,RI .62,.68 82%,EFW 3047 g 74%,AC 96% A copy of this report including all images has been saved and backed up to a second source for retrieval if needed. All measures and details of the anatomical scan, placentation, fluid volume and pelvic anatomy are contained in that report. Amber Heide Guile 11/22/2016 4:06 PM Clinical Impression and recommendations: I have reviewed the sonogram results above, combined with the patient's current clinical course, below are my impressions and any appropriate recommendations for management based on the sonographic findings. 1.  [id number] Estimated Date of Delivery: 12/25/16 by serial sonographic evaluations 2.  Fetal sonographic surveillance findings: a). Normal fluid volume b). Normal antepartum fetal assessment with BPP 8/8 c). Normal fetal Doppler ratios with consistent diastolic flow , 32%GMWN d). Normal growth percentile with appropriate interval growth, 74%tule 3.  Normal general sonographic findings Recommend continued prenatal evaluations and care based on this sonogram and as clinically indicated from the patient's clinical course. Florian Buff 11/22/2016 4:15 PM      Assessment: 101w1d Estimated Date of Delivery: 12/25/16  Chronic hypertennison on nifedipine 30 xl Class A2/B DM on glyburide 5 BID Previous C section x 2, declines Trial of labor  Patient Active Problem List   Diagnosis Date Noted  . Asthma affecting pregnancy, antepartum 09/16/2016  . Bronchitis with bronchospasm 09/12/2016  . Hyponatremia with extracellular fluid depletion 08/30/2016  .  Gestational diabetes mellitus, class A2/B 05/27/2016  . Chronic hypertension affecting pregnancy 05/23/2016  . Supervision of high risk pregnancy, antepartum 05/23/2016  . Pregnancy with history of cesarean section, antepartum 05/23/2016  . Fibroid 05/23/2016  . Asthma 04/12/2016  . Borderline diabetes 04/02/2015  . Essential hypertension 04/02/2015  . HSV-2 (herpes simplex virus 2) infection 12/31/2012    Plan: Repeat Caesarean section Pt declines tubal sterilization procedure  Riyanshi Wahab H 12/19/2016 8:35 AM

## 2016-12-19 NOTE — Transfer of Care (Signed)
Immediate Anesthesia Transfer of Care Note  Patient: Tina Wall  Procedure(s) Performed: Procedure(s): REPEAT CESAREAN SECTION (N/A)  Patient Location: PACU  Anesthesia Type:Spinal  Level of Consciousness: awake, alert  and oriented  Airway & Oxygen Therapy: Patient Spontanous Breathing  Post-op Assessment: Report given to RN and Post -op Vital signs reviewed and stable  Post vital signs: Reviewed and stable  Last Vitals:  Vitals:   12/19/16 0720  BP: (!) 148/82  Pulse: 98  Resp: 20  Temp: 36.9 C    Last Pain:  Vitals:   12/19/16 0720  TempSrc: Oral         Complications: No apparent anesthesia complications

## 2016-12-19 NOTE — Op Note (Signed)
Cesarean Section Operative Report  Tina Wall  12/19/2016  Indications: Scheduled Proceedure/Maternal Request   Pre-operative Diagnosis: previous c/s.   Post-operative Diagnosis: Same   Surgeon: Surgeon(s) and Role:    * Florian Buff, MD    * Waldemar Dickens, MD   Assistants: none  Anesthesia: spinal    Estimated Blood Loss: 625 ml   Specimens: ABG  Findings: Viable female infant in cephalic presentation; Apgars 8 and 8 ; arterial cord pH pending; clear amniotic fluid; intact placenta with three vessel cord; normal uterus, fallopian tubes and ovaries bilaterally.  Baby condition / location:  Couplet care / Skin to Skin   Complications: no complications  Indications: [name] is a 36 y.o. [id number] with an IUP [redacted]w[redacted]d presenting for scheduled repeat.  Procedure Details:  The patient was taken back to the operative suite where spinal anesthesia was placed.  A time out was held and the above information confirmed.   After induction of anesthesia, the patient was draped and prepped in the usual sterile manner and placed in a dorsal supine position with a leftward tilt. A Pfannenstiel incision was made and carried down through the subcutaneous tissue to the fascia. Fascial incision was made and sharply extended transversely. The fascia was separated from the underlying rectus tissue superiorly and inferiorly. The peritoneum was identified and bluntly entered and extended longitudinaly. A low transverse uterine incision was made and extended bluntly. Delivered from cephalic presentation was a viable infant with Apgars and weight as above.  After waiting 60 seconds for delayed cord cutting, the umbilical cord was clamped and cut cord blood was obtained for evaluation. Cord ph was sent. The placenta was removed Intact and appeared normal. The uterine outline, tubes and ovaries appeared normal. The uterine incision was closed with running locked sutures of 73monocryl with  an imbricating layer of the same.   Hemostasis was observed. The peritoneum was closed with 2-0  Plain gut. The rectus muscles were examined and hemostasis observed. The fascia was then reapproximated with running sutures of 0Vicryl. The subcuticular closure was performed using 2-0plain gut. The skin was closed with 4-0Vicryl.   Instrument, sponge, and needle counts were correct prior the abdominal closure and were correct at the conclusion of the case.     Disposition: PACU - hemodynamically stable.       SignedBrayton Mars 12/19/2016 11:23 AM

## 2016-12-20 ENCOUNTER — Encounter (HOSPITAL_COMMUNITY): Payer: Self-pay | Admitting: Obstetrics & Gynecology

## 2016-12-20 LAB — CBC
HCT: 27.4 % — ABNORMAL LOW (ref 36.0–46.0)
Hemoglobin: 9 g/dL — ABNORMAL LOW (ref 12.0–15.0)
MCH: 30.7 pg (ref 26.0–34.0)
MCHC: 32.8 g/dL (ref 30.0–36.0)
MCV: 93.5 fL (ref 78.0–100.0)
Platelets: 165 10*3/uL (ref 150–400)
RBC: 2.93 MIL/uL — ABNORMAL LOW (ref 3.87–5.11)
RDW: 14.4 % (ref 11.5–15.5)
WBC: 6 10*3/uL (ref 4.0–10.5)

## 2016-12-20 MED ORDER — OXYCODONE HCL 5 MG PO TABS
5.0000 mg | ORAL_TABLET | ORAL | Status: DC | PRN
  Administered 2016-12-20 – 2016-12-22 (×7): 5 mg via ORAL
  Filled 2016-12-20 (×7): qty 1

## 2016-12-20 MED ORDER — AMLODIPINE BESYLATE 5 MG PO TABS
5.0000 mg | ORAL_TABLET | Freq: Every day | ORAL | Status: DC
  Administered 2016-12-20 – 2016-12-22 (×3): 5 mg via ORAL
  Filled 2016-12-20 (×4): qty 1

## 2016-12-20 NOTE — Progress Notes (Signed)
Post Partum Day #1 Subjective: no complaints, up ad lib and voiding  Objective: Blood pressure (!) 115/52, pulse 61, temperature 98.1 F (36.7 C), temperature source Oral, resp. rate 18, height 5\' 1"  (1.549 m), weight 108 kg (238 lb), last menstrual period 03/20/2016, SpO2 100 %, unknown if currently breastfeeding.  Physical Exam:  General: alert Lochia: appropriate Uterine Fundus: firm and appropriately tender at U Incision: Dressing: c/d/i DVT Evaluation: No evidence of DVT seen on physical exam.   Recent Labs  12/20/16 0535  HGB 9.0*  HCT 27.4*    Assessment/Plan: Continue current care She would like a circumcision for her son. She is attempting to breast feed.   LOS: 1 day   Emily Filbert 12/20/2016, 6:57 AM

## 2016-12-20 NOTE — Lactation Note (Signed)
This note was copied from a baby's chart. Lactation Consultation Note  Patient Name: Tina Wall VOUZH'Q Date: 12/20/2016   Visited with Mom, baby 21 hrs old.  Mom choosing to exclusively pump and bottle feed baby.  Set up [employer] DEBP, and assisted with Mom's first pumping.  Teaching on basic routine and cleaning shared.  Encouraged breast massage, and hand expression along with double pumping.  Encouraged STS, and feeding baby often on cue.  Volume parameter handout given, and encouraged baby to receive 15-30 ml today.   Offered Mom to call for assistance as needed.  Lactation to follow up 12/21/16  Broadus John 12/20/2016, 11:26 AM

## 2016-12-20 NOTE — Lactation Note (Signed)
This note was copied from a baby's chart. Lactation Consultation Note Baby had just been given formula. Regurgitating some in mouth, but not spitting out. Got baby out of crib, given to mom to hold up right. Encouraged mom to call for next feeding for assistance.  Mom has LARGE pendulum breast w/everted compressible nipple slightly turning inwards towards body at the end of breast. Mom BF her 2nd child 3 months breast/formula. Breast after delivery.  Patient Name: Boy Mitzie Marlar DTHYH'O Date: 12/20/2016 Reason for consult: Follow-up assessment;Other (Comment) (hypoglycemia)   Maternal Data    Feeding Feeding Type: Formula  LATCH Score/Interventions                      Lactation Tools Discussed/Used     Consult Status Consult Status: Follow-up Date: 12/20/16 Follow-up type: In-patient    Theodoro Kalata 12/20/2016, 12:49 AM

## 2016-12-21 MED ORDER — IBUPROFEN 600 MG PO TABS: 600.0000 mg | ORAL_TABLET | Freq: Four times a day (QID) | ORAL | 0 refills | Status: DC

## 2016-12-21 MED ORDER — OXYCODONE HCL 5 MG PO TABS: 5.0000 mg | ORAL_TABLET | ORAL | 0 refills | Status: DC | PRN

## 2016-12-21 NOTE — Discharge Instructions (Signed)

## 2016-12-21 NOTE — Discharge Summary (Signed)
OB Discharge Summary  Patient Name: Tina Wall DOB: 10-21-1980 MRN: [id number]  Date of admission: 12/19/2016 Delivering MD: Tania Ade H   Date of discharge: 12/21/2016  Admitting diagnosis: PREV CS Intrauterine pregnancy: [redacted]w[redacted]d     Secondary diagnosis:Active Problems:   History of cesarean delivery  Additional problems: morbid obesity, GDM, chronic HTN, anemia, + RPR     Discharge diagnosis: Term Pregnancy Delivered, Gestational Hypertension, CHTN and Anemia                                                                       Complications: None  Hospital course:  Sceduled C/S   36 y.o. yo [id number] at [redacted]w[redacted]d was admitted to the hospital 12/19/2016 for scheduled cesarean section with the following indication:Elective Repeat.  Membrane Rupture Time/Date: 9:22 AM ,12/19/2016   Patient delivered a Viable infant.12/19/2016  Details of operation can be found in separate operative note.  Pateint had an uncomplicated postpartum course.  She is ambulating, tolerating a regular diet, passing flatus, and urinating well. Patient is discharged home in stable condition on  12/21/16         Physical exam  Vitals:   12/20/16 0805 12/20/16 0958 12/20/16 1900 12/21/16 0544  BP: 121/60 (!) 158/81 136/67 135/89  Pulse: 78  93 68  Resp: 18  18 18   Temp: 98.4 F (36.9 C)  98.6 F (37 C) 98.7 F (37.1 C)  TempSrc: Oral  Oral Oral  SpO2:   100%   Weight:      Height:       General: alert Lochia: appropriate Uterine Fundus: firm Incision: Dressing is clean, dry, and intact DVT Evaluation: No evidence of DVT seen on physical exam. Labs: Lab Results  Component Value Date   WBC 6.0 12/20/2016   HGB 9.0 (L) 12/20/2016   HCT 27.4 (L) 12/20/2016   MCV 93.5 12/20/2016   PLT 165 12/20/2016   CMP Latest Ref Rng & Units 12/16/2016  Glucose 65 - 99 mg/dL 89  BUN 6 - 20 mg/dL 9  Creatinine 0.44 - 1.00 mg/dL 0.66  Sodium 135 - 145 mmol/L 135  Potassium 3.5 - 5.1 mmol/L 3.1(L)   Chloride 101 - 111 mmol/L 106  CO2 22 - 32 mmol/L 21(L)  Calcium 8.9 - 10.3 mg/dL 9.2  Total Protein 6.5 - 8.1 g/dL 7.0  Total Bilirubin 0.3 - 1.2 mg/dL 0.5  Alkaline Phos 38 - 126 U/L 131(H)  AST 15 - 41 U/L 19  ALT 14 - 54 U/L 12(L)    Discharge instruction: per After Visit Summary and "Baby and Me Booklet".  After Visit Meds:  Allergies as of 12/21/2016      Reactions   Ace Inhibitors Shortness Of Breath   Short of breath   Latex Other (See Comments)   Causes irritation.      Medication List    STOP taking these medications   ACCU-CHEK FASTCLIX LANCETS Misc   acetaminophen 500 MG tablet Commonly known as:  TYLENOL   acyclovir 400 MG tablet Commonly known as:  ZOVIRAX   aspirin 81 MG chewable tablet Commonly known as:  ASPIRIN CHILDRENS   glucose blood test strip   glyBURIDE 2.5 MG tablet Commonly known  as:  DIABETA   glyBURIDE 5 MG tablet Commonly known as:  DIABETA   NIFEdipine 30 MG 24 hr tablet Commonly known as:  PROCARDIA-XL/ADALAT CC     TAKE these medications   albuterol 108 (90 Base) MCG/ACT inhaler Commonly known as:  PROAIR HFA Inhale 2 puffs into the lungs every 4 (four) hours as needed for wheezing or shortness of breath. Reported on 10/08/2015 What changed:  when to take this  additional instructions   ibuprofen 600 MG tablet Commonly known as:  ADVIL,MOTRIN Take 1 tablet (600 mg total) by mouth every 6 (six) hours.   ipratropium-albuterol 0.5-2.5 (3) MG/3ML Soln Commonly known as:  DUONEB Take 3 mLs by nebulization every 4 (four) hours. What changed:  when to take this  reasons to take this   omeprazole 20 MG capsule Commonly known as:  PRILOSEC Take 1 capsule (20 mg total) by mouth daily.   oxyCODONE 5 MG immediate release tablet Commonly known as:  Oxy IR/ROXICODONE Take 1 tablet (5 mg total) by mouth every 4 (four) hours as needed for moderate pain.   prenatal multivitamin Tabs tablet Take 1 tablet by mouth daily at 12  noon.       Diet: routine diet  Activity: Advance as tolerated. Pelvic rest for 6 weeks.   Outpatient follow up:She has a BP/incision check appt in 1 week and will schedule a 6 week postpartum visit. Follow up Appt:Future Appointments Date Time Provider [place]  12/26/2016 11:45 AM Florian Buff, MD [facility]  08/23/2017 8:15 AM Pleas Koch, NP [facility]   Follow up visit: No Follow-up on file.  Postpartum contraception: plans [facility]UD at [facility] visit  Newborn Data: Live born female  Birth Weight: 8 lb 4.8 oz (3765 g) APGAR: 8, 8  Baby Feeding: Bottle Disposition:home with mother   12/21/2016 Emily Filbert, MD

## 2016-12-21 NOTE — Lactation Note (Signed)
This note was copied from a baby's chart. Lactation Consultation Note  Baby 47 hours old.  Mother is pumping and bottle feeding. Reminded her to pump q3 hours, hand express before and after breastfeeding.  She is getting ready to pump. She has her own DEBP.  Denies concerns or questions. Follow up PRN.  Patient Name: Boy Mallory Schaad RSWNI'O Date: 12/21/2016 Reason for consult: Follow-up assessment   Maternal Data    Feeding Feeding Type: Bottle Fed - Formula  LATCH Score/Interventions                      Lactation Tools Discussed/Used     Consult Status Consult Status: Follow-up Date: 12/22/16 Follow-up type: In-patient    Vivianne Master [facility] 12/21/2016, 11:55 AM

## 2016-12-22 ENCOUNTER — Encounter (HOSPITAL_COMMUNITY): Payer: Self-pay | Admitting: Obstetrics & Gynecology

## 2016-12-22 MED ORDER — NIFEDIPINE ER OSMOTIC RELEASE 30 MG PO TB24: 30.0000 mg | ORAL_TABLET | Freq: Every day | ORAL | 6 refills | Status: DC

## 2016-12-22 NOTE — Lactation Note (Signed)
This note was copied from a baby's chart. Lactation Consultation Note: Mother is bottle feeding and breast feeding. Mother reports that she attempt to latch infant during the night and the infant refused the breast. Mother reports that she pumped 20 ml when last pumped. Mother has a Ameda Finesse pump. She reports that she likes her pump. Advised to continue to post pump every 2-3 hours for 15-20 mins. Mother was given treatment guidelines to prevent engorgement and reviewed S/S of Mastitis. Mother denies having any concerns or questions. Mother is aware of available [place] services.   Patient Name: Tina Wall MRAJH'H Date: 12/22/2016 Reason for consult: Follow-up assessment   Maternal Data    Feeding Feeding Type: Formula Nipple Type: Slow - flow  LATCH Score/Interventions                      Lactation Tools Discussed/Used     Consult Status Consult Status: Complete Date: 12/22/16 Follow-up type: In-patient    Jess Barters [facility] 12/22/2016, 10:03 AM

## 2016-12-22 NOTE — Discharge Summary (Addendum)
Discharge Summary  Patient Name: Tina Wall DOB: 24-Jul-1981 MRN: [id number]  Date of admission: 12/19/2016 Delivering MD: Tania Ade H   Date of discharge: 12/22/2016  Admitting diagnosis: PREV CS Intrauterine pregnancy: [redacted]w[redacted]d     Secondary diagnosis:Active Problems:   History of cesarean delivery  Additional problems: morbid obesity, GDM, chronic HTN, anemia, + RPR                                     Discharge diagnosis: Term Pregnancy Delivered, Gestational Hypertension, CHTN and Anemia                                                                                 Complications: None  Hospital course:  Sceduled C/S   36 y.o. yo [id number] at [redacted]w[redacted]d was admitted to the hospital 12/19/2016 for scheduled cesarean section with the following indication:Elective Repeat.  Membrane Rupture Time/Date: 9:22 AM ,12/19/2016   Patient delivered a Viable infant.12/19/2016  Details of operation can be found in separate operative note.  Pateint had an uncomplicated postpartum course.  She is ambulating, tolerating a regular diet, passing flatus, and urinating well. Patient is discharged home in stable condition on  12/21/16         Physical exam        Vitals:   12/20/16 0805 12/20/16 0958 12/20/16 1900 12/21/16 0544  BP: 121/60 (!) 158/81 136/67 135/89  Pulse: 78  93 68  Resp: 18  18 18   Temp: 98.4 F (36.9 C)  98.6 F (37 C) 98.7 F (37.1 C)  TempSrc: Oral  Oral Oral  SpO2:   100%   Weight:      Height:       General: alert Lochia: appropriate Uterine Fundus: firm Incision: Dressing is clean, dry, and intact DVT Evaluation: No evidence of DVT seen on physical exam. Labs: Recent Labs       Lab Results  Component Value Date   WBC 6.0 12/20/2016   HGB 9.0 (L) 12/20/2016   HCT 27.4 (L) 12/20/2016   MCV 93.5 12/20/2016   PLT 165 12/20/2016     CMP Latest Ref Rng & Units 12/16/2016  Glucose 65 - 99 mg/dL 89  BUN 6 - 20 mg/dL 9  Creatinine 0.44  - 1.00 mg/dL 0.66  Sodium 135 - 145 mmol/L 135  Potassium 3.5 - 5.1 mmol/L 3.1(L)  Chloride 101 - 111 mmol/L 106  CO2 22 - 32 mmol/L 21(L)  Calcium 8.9 - 10.3 mg/dL 9.2  Total Protein 6.5 - 8.1 g/dL 7.0  Total Bilirubin 0.3 - 1.2 mg/dL 0.5  Alkaline Phos 38 - 126 U/L 131(H)  AST 15 - 41 U/L 19  ALT 14 - 54 U/L 12(L)    Discharge instruction: per After Visit Summary and "Baby and Me Booklet".  After Visit Meds:      Allergies as of 12/21/2016      Reactions   Ace Inhibitors Shortness Of Breath   Short of breath   Latex Other (See Comments)   Causes irritation.         Medication List  STOP taking these medications   ACCU-CHEK FASTCLIX LANCETS Misc   acetaminophen 500 MG tablet Commonly known as:  TYLENOL   acyclovir 400 MG tablet Commonly known as:  ZOVIRAX   aspirin 81 MG chewable tablet Commonly known as:  ASPIRIN CHILDRENS   glucose blood test strip   glyBURIDE 2.5 MG tablet Commonly known as:  DIABETA   glyBURIDE 5 MG tablet Commonly known as:  DIABETA   NIFEdipine 30 MG 24 hr tablet Commonly known as:  PROCARDIA-XL/ADALAT CC     TAKE these medications   albuterol 108 (90 Base) MCG/ACT inhaler Commonly known as:  PROAIR HFA Inhale 2 puffs into the lungs every 4 (four) hours as needed for wheezing or shortness of breath. Reported on 10/08/2015 What changed:  when to take this  additional instructions   ibuprofen 600 MG tablet Commonly known as:  ADVIL,MOTRIN Take 1 tablet (600 mg total) by mouth every 6 (six) hours.   ipratropium-albuterol 0.5-2.5 (3) MG/3ML Soln Commonly known as:  DUONEB Take 3 mLs by nebulization every 4 (four) hours. What changed:  when to take this  reasons to take this Procardia XL 30 mg daily  omeprazole 20 MG capsule Commonly known as:  PRILOSEC Take 1 capsule (20 mg total) by mouth daily.   oxyCODONE 5 MG immediate release tablet Commonly known as:  Oxy IR/ROXICODONE Take 1 tablet (5 mg  total) by mouth every 4 (four) hours as needed for moderate pain.   prenatal multivitamin Tabs tablet Take 1 tablet by mouth daily at 12 noon.       Diet: routine diet  Activity: Advance as tolerated. Pelvic rest for 6 weeks.   Outpatient follow up:She has a BP/incision check appt in 1 week and will schedule a 6 week postpartum visit. Follow up Appt:Future Appointments Date Time Provider [place]  12/26/2016 11:45 AM Florian Buff, MD [facility]  08/23/2017 8:15 AM Pleas Koch, NP [facility]   Follow up visit: No Follow-up on file.  Postpartum contraception: plans IUD at [facility] visit  Newborn Data: Live born female  Birth Weight: 8 lb 4.8 oz (3765 g) APGAR: 8, 8  Baby Feeding: Bottle Disposition:home with mother

## 2016-12-26 ENCOUNTER — Ambulatory Visit: Payer: Managed Care, Other (non HMO) | Admitting: Women's Health

## 2016-12-26 ENCOUNTER — Encounter: Payer: Self-pay | Admitting: Women's Health

## 2016-12-26 VITALS — BP 142/90 | HR 91 | Wt 219.0 lb

## 2016-12-26 DIAGNOSIS — I10 Essential (primary) hypertension: Secondary | ICD-10-CM

## 2016-12-26 DIAGNOSIS — B009 Herpesviral infection, unspecified: Secondary | ICD-10-CM

## 2016-12-26 DIAGNOSIS — Z8632 Personal history of gestational diabetes: Secondary | ICD-10-CM

## 2016-12-26 DIAGNOSIS — Z98891 History of uterine scar from previous surgery: Secondary | ICD-10-CM

## 2016-12-26 DIAGNOSIS — Z4889 Encounter for other specified surgical aftercare: Secondary | ICD-10-CM

## 2016-12-26 MED ORDER — SILVER SULFADIAZINE 1 % EX CREA: 1.0000 "application " | TOPICAL_CREAM | Freq: Two times a day (BID) | CUTANEOUS | 0 refills | Status: DC

## 2016-12-26 MED ORDER — AMLODIPINE BESYLATE 5 MG PO TABS: 5.0000 mg | ORAL_TABLET | Freq: Every day | ORAL | 3 refills | Status: DC

## 2016-12-26 NOTE — Progress Notes (Signed)
   [facility]  Patient name: Tina Wall MRN [id number]  Date of birth: 1981/01/12  CC & HPI:  Tina Wall is a 36 y.o. [id number] African American female 1wk s/p RLTCS presenting today for incision check. On procardia xl 30mg  daily for [facility], took this am about 2hrs ago. Denies ha, visual changes, ruq/epigastric pain, n/v.  Was on norvasc 5mg  daily prior to pregnancy. Breastfeeding- pumping only, decreased supply, having to supplement w/ formula. A2/BDM during pregnancy. Hasn't looked at incision, is 'afraid to lift belly up'. Felt hot/dizzy like she was going to pass out yesterday, hadn't had anything to eat.  No LMP recorded. The current method of family planning is abstinence and plans for IUD.  Pertinent History Reviewed:  Medical & Surgical Hx:   Past medical, surgical, family, and social history reviewed in electronic medical record Medications: Reviewed & Updated - see associated section Allergies: Reviewed in electronic medical record  Objective Findings:  Vitals: BP (!) 142/90 (BP Location: Left Arm, Patient Position: Sitting, Cuff Size: Large)   Pulse 91   Wt 219 lb (99.3 kg)   Breastfeeding? Yes   BMI 41.38 kg/m  Body mass index is 41.38 kg/m.  Physical Examination: General appearance - alert, well appearing, and in no distress Abdomen - 2 healing round open areas on Lt lower abdomen above incision from where c/s drape adhesive was, no s/s infection C/S incision: lots of gunky dried dermabond removed to be able to see incision- incision healing well- some open areas, but healing nicely, edges approximated, no abnormal drainage or s/s infection   No results found for this or any previous visit (from the past 24 hour(s)).   Assessment & Plan:  A:   1wk s/p RLTCS  Breastfeeding, decreased supply  CHTN  A2/BDM  2 open areas on lower abd from adhesive  P:  Rx silvadene cream- apply to 2 open areas bid  Lift panus up in shower- wash incision w/ warm  soapy water, pat dry and keep clean and dry  Stop procardia, rx norvasc 5mg  daily  Recommended f/u in 2d for bp check, pt would rather check bp at home, to check bid, if consistently sbp>=140 or dbp >=90 to let us know  Gave printed info on increasing milk supply  Eat small frequent snacks w/ protein (dizzy spell)  Return in about 4 weeks (around 01/23/2017) for postpartum visit, then 7wks from now for 2hr sugar test and IUD insertion.  Tawnya Crook CNM, [facility] 12/26/2016 12:55 PM

## 2016-12-26 NOTE — Patient Instructions (Signed)
Take your blood pressure twice daily (while resting), if top number consistently 140 or greater, or bottom number 90 or greater- call me and let me know  Apply silvadene cream to 2 open areas on abdomen twice daily  Wash incision with warm soapy water, pat dry, keep clean and dry  NO SEX UNTIL AFTER YOU GET YOUR BIRTH CONTROL   Tips To Increase Milk Supply  Lots of water! Enough so that your urine is clear  Plenty of calories, if you're not getting enough calories, your milk supply can decrease  Breastfeed/pump often, every 2-3 hours x 20-85mins  Fenugreek 3 pills 3 times a day, this may make your urine smell like maple syrup  Mother's Milk Tea  Lactation cookies, google for the recipe  Real oatmeal

## 2017-01-20 NOTE — Addendum Note (Signed)
Addendum  created 01/20/17 1308 by Effie Berkshire, MD   Sign clinical note

## 2017-01-23 ENCOUNTER — Ambulatory Visit (INDEPENDENT_AMBULATORY_CARE_PROVIDER_SITE_OTHER): Payer: Managed Care, Other (non HMO) | Admitting: Women's Health

## 2017-01-23 ENCOUNTER — Encounter: Payer: Self-pay | Admitting: Women's Health

## 2017-01-23 DIAGNOSIS — I10 Essential (primary) hypertension: Secondary | ICD-10-CM

## 2017-01-23 DIAGNOSIS — Z3202 Encounter for pregnancy test, result negative: Secondary | ICD-10-CM

## 2017-01-23 DIAGNOSIS — O24419 Gestational diabetes mellitus in pregnancy, unspecified control: Secondary | ICD-10-CM

## 2017-01-23 DIAGNOSIS — K629 Disease of anus and rectum, unspecified: Secondary | ICD-10-CM

## 2017-01-23 LAB — POCT URINE PREGNANCY: Preg Test, Ur: NEGATIVE

## 2017-01-23 NOTE — Patient Instructions (Addendum)
NO SEX UNTIL AFTER YOU GET YOUR TUBES TIED   Laparoscopic Tubal Ligation Laparoscopic tubal ligation is a procedure to close the fallopian tubes. This is done so that you cannot get pregnant. When the fallopian tubes are closed, the eggs that your ovaries release cannot enter the uterus, and sperm cannot reach the released eggs. A laparoscopic tubal ligation is sometimes called "getting your tubes tied." You should not have this procedure if you want to get pregnant someday or if you are unsure about having more children. Tell a health care provider about:  Any allergies you have.  All medicines you are taking, including vitamins, herbs, eye drops, creams, and over-the-counter medicines.  Any problems you or family members have had with anesthetic medicines.  Any blood disorders you have.  Any surgeries you have had.  Any medical conditions you have.  Whether you are pregnant or may be pregnant.  Any past pregnancies. What are the risks? Generally, this is a safe procedure. However, problems may occur, including:  Infection.  Bleeding.  Injury to surrounding organs.  Side effects from anesthetics.  Failure of the procedure.  This procedure can increase your risk of a kind of pregnancy in which a fertilized egg attaches to the outside of the uterus (ectopic pregnancy). What happens before the procedure?  Ask your health care provider about: ? Changing or stopping your regular medicines. This is especially important if you are taking diabetes medicines or blood thinners. ? Taking medicines such as aspirin and ibuprofen. These medicines can thin your blood. Do not take these medicines before your procedure if your health care provider instructs you not to.  Follow instructions from your health care provider about eating and drinking restrictions.  Plan to have someone take you home after the procedure.  If you go home right after the procedure, plan to have someone with  you for 24 hours. What happens during the procedure?  You will be given one or more of the following: ? A medicine to help you relax (sedative). ? A medicine to numb the area (local anesthetic). ? A medicine to make you fall asleep (general anesthetic). ? A medicine that is injected into an area of your body to numb everything below the injection site (regional anesthetic).  An IV tube will be inserted into one of your veins. It will be used to give you medicines and fluids during the procedure.  Your bladder may be emptied with a small tube (catheter).  If you have been given a general anesthetic, a tube will be put down your throat to help you breathe.  Two small cuts (incisions) will be made in your lower abdomen and near your belly button.  Your abdomen will be inflated with a gas. This will let the surgeon see better and will give the surgeon room to work.  A thin, lighted tube (laparoscope) with a camera attached will be inserted into your abdomen through one of the incisions. Small instruments will be inserted through the other incision.  The fallopian tubes will be tied off, burned (cauterized), or blocked with a clip, ring, or clamp. A small portion in the center of each fallopian tube may be removed.  The gas will be released from the abdomen.  The incisions will be closed with stitches (sutures).  A bandage (dressing) will be placed over the incisions. The procedure may vary among health care providers and hospitals. What happens after the procedure?  Your blood pressure, heart rate, breathing rate, and  blood oxygen level will be monitored often until the medicines you were given have worn off.  You will be given medicine to help with pain, nausea, and vomiting as needed. This information is not intended to replace advice given to you by your health care provider. Make sure you discuss any questions you have with your health care provider. Document Released: 11/14/2000  Document Revised: 01/14/2016 Document Reviewed: 07/19/2015 Elsevier Interactive Patient Education  [employer].

## 2017-01-23 NOTE — Progress Notes (Signed)
Subjective:    Tina Wall is a 36 y.o. G61P4004 African American female who presents for a postpartum visit. She is 4 weeks postpartum following a repeat cesarean section, low transverse incision at 39.1 gestational weeks. Anesthesia: spinal. I have fully reviewed the prenatal and intrapartum course. Pregnancy complicated by [facility]- on Norvasc 5mg  daily and A2/BDM. Postpartum course has been uncomplicated. Baby's course has been uncomplicated. Baby is feeding by bottle. Bleeding no bleeding. Bowel function is bm's daily, feels like something is 'blocking' bm, is uncomfortable, so only goes small amounts but more frequently. Denies straining/hard stools, bleeding. Does have h/o rectal bleeding x 2wks few years ago, but stopped. H/O hemorrhoids, but states this does not feel like a hemorrhoid. Bladder function is normal. Patient is sexually active. Last sexual activity: this morning. Contraception method is coitus interruptus and originally planned IUD, but now wants BTL. Postpartum depression screening: negative. Score 1.  Last pap 07/2015 at PCP and was normal.  The following portions of the patient's history were reviewed and updated as appropriate: allergies, current medications, past medical history, past surgical history and problem list.  Review of Systems Pertinent items are noted in HPI.   Vitals:   01/23/17 1154  BP: 130/86  Pulse: 64  Weight: 217 lb (98.4 kg)   No LMP recorded.  Objective:   General:  alert, cooperative and no distress   Breasts:  deferred, no complaints  Lungs: clear to auscultation bilaterally  Heart:  regular rate and rhythm  Abdomen: soft, nontender, pinkish spot Lt corner of incision, feels like hard knot below- started draining white when I palpated. Co-exam w/ [facility]- feels it is a suture abscess- will take few more weeks to heal, keep clean & dry, can apply her silvadene bid    Vulva: normal  Vagina: normal vagina  Cervix:  closed  Corpus: Well-involuted   Adnexa:  Non-palpable  Rectal Exam: No external hemorrhoids, no abnormalities felt on rectal exam        Assessment:   Postpartum exam 4 wks s/p RCS CHTN A2/BDM during pregnancy Bottlefeeding Now desires BTL Perception of something in rectum blocking bm's Depression screening Contraception counseling   Plan:  Contraception: abstinence until BTL Follow up in: asap for pre-op w/ MD, then 6/22 for 2hr GTT Continue norvasc 5mg  daily Discussed GI referral- pt wants, ordered today, let us know if hasn't heard from them w/in 1wk Keep suture abscess area clean/dry, can apply silvadene bid  Tawnya Crook CNM, [facility] 01/23/2017 12:08 PM

## 2017-01-24 ENCOUNTER — Encounter: Payer: Self-pay | Admitting: Internal Medicine

## 2017-01-30 ENCOUNTER — Ambulatory Visit (INDEPENDENT_AMBULATORY_CARE_PROVIDER_SITE_OTHER): Payer: Managed Care, Other (non HMO) | Admitting: Obstetrics & Gynecology

## 2017-01-30 ENCOUNTER — Encounter: Payer: Self-pay | Admitting: Obstetrics & Gynecology

## 2017-01-30 VITALS — BP 140/90 | HR 85 | Wt 221.0 lb

## 2017-01-30 DIAGNOSIS — Z3202 Encounter for pregnancy test, result negative: Secondary | ICD-10-CM | POA: Diagnosis not present

## 2017-01-30 DIAGNOSIS — Z3009 Encounter for other general counseling and advice on contraception: Secondary | ICD-10-CM

## 2017-01-30 LAB — POCT URINE PREGNANCY: Preg Test, Ur: NEGATIVE

## 2017-01-30 NOTE — Progress Notes (Signed)
Chief Complaint  Patient presents with  . Contraception    pt was considering BTL but does not want now    Blood pressure 140/90, pulse 85, weight 221 lb (100.2 kg), not currently breastfeeding.  36 y.o. [id number] No LMP recorded. The current method of family planning is none.  Outpatient Encounter Prescriptions as of 01/30/2017  Medication Sig  . albuterol (PROAIR HFA) 108 (90 Base) MCG/ACT inhaler Inhale 2 puffs into the lungs every 4 (four) hours as needed for wheezing or shortness of breath. Reported on 10/08/2015 (Patient taking differently: Inhale 2 puffs into the lungs every 6 (six) hours as needed for wheezing or shortness of breath. Reported on 10/08/2015)  . amLODipine (NORVASC) 5 MG tablet Take 1 tablet (5 mg total) by mouth daily.  Marland Kitchen ibuprofen (ADVIL,MOTRIN) 600 MG tablet Take 1 tablet (600 mg total) by mouth every 6 (six) hours.  Marland Kitchen ipratropium-albuterol (DUONEB) 0.5-2.5 (3) MG/3ML SOLN Take 3 mLs by nebulization every 4 (four) hours. (Patient taking differently: Take 3 mLs by nebulization every 4 (four) hours as needed (shortness of breath). )  . silver sulfADIAZINE (SILVADENE) 1 % cream Apply 1 application topically 2 (two) times daily. (Patient not taking: Reported on 01/23/2017)   No facility-administered encounter medications on file as of 01/30/2017.     Subjective Patient is a little bit of a jam She came here today originally discuss having a tubal ligation but she is really not 100% sure she wants to do that so I strongly advised her against considering that at this point Even if she decides to do it in a couple years until she is ready to do it minimally and psychologically sonogram tied the However she's been having intercourse most recently this morning So she will need to stop having intercourse for the next 2 weeks Come in in 2 weeks to have a quantitative hCG done and she states she wants to go with the Mirena IUD If her hCG is negative on the 25th then she'll  come back in on the 26th at the [facility] IUD placed with either camera Manus Gunning We discussed FO birth control pills next mom but she really wants to go with the Mirena IUD  Objective   Pertinent ROS   Labs or studies     Impression Diagnoses this Encounter::   ICD-10-CM   1. Counseling for birth control regarding intrauterine device (IUD) Z30.09 hCG, quantitative, pregnancy  2. Pregnancy examination or test, negative result Z32.02 POCT urine pregnancy    Established relevant diagnosis(es):   Plan/Recommendations: No orders of the defined types were placed in this encounter.   Labs or Scans Ordered: Orders Placed This Encounter  Procedures  . hCG, quantitative, pregnancy  . POCT urine pregnancy    Management:: HCG quantitative in 2 weeks follow the next day by a placement of a Mirena IUD per patient request after thorough counseling Patient understands she is to absolutely positively not have vaginal intercourse in the next 2 weeks until her Mirena IUD is placed She understands and states she will comply  Follow up Return in about 2 weeks (around 02/14/2017) for Mirena IUD placement with Maudie Mercury or Manus Gunning.        Face to face time:  15 minutes  Greater than 50% of the visit time was spent in counseling and coordination of care with the patient.  The summary and outline of the counseling and care coordination is summarized in the note above.   All questions  were answered.  Past Medical History:  Diagnosis Date  . Asthma   . Bronchitis   . Diabetes mellitus without complication ([place])   . Gestational diabetes   . GI bleeding   . Headache   . Heart murmur   . HSV-2 (herpes simplex virus 2) infection   . Hx of chlamydia infection   . Hx of trichomoniasis   . Hypertension   . Ovarian cyst   . Syphilis     Past Surgical History:  Procedure Laterality Date  . CESAREAN SECTION     C/S x 2  . CESAREAN SECTION N/A 12/19/2016   Procedure: REPEAT CESAREAN SECTION;   Surgeon: Florian Buff, MD;  Location: [facility];  Service: Obstetrics;  Laterality: N/A;  . FOOT SURGERY    . TONSILLECTOMY    . WISDOM TOOTH EXTRACTION      OB History    Gravida Para Term Preterm AB Living   4 4 4     4    SAB TAB Ectopic Multiple Live Births         0 4      Allergies  Allergen Reactions  . Ace Inhibitors Shortness Of Breath    Short of breath  . Latex Other (See Comments)    Causes irritation.    Social History   Social History  . Marital status: Divorced    Spouse name: N/A  . Number of children: N/A  . Years of education: N/A   Social History Main Topics  . Smoking status: Never Smoker  . Smokeless tobacco: Never Used  . Alcohol use No     Comment: occasionally  . Drug use: No  . Sexual activity: Yes    Partners: Male    Birth control/ protection: None   Other Topics Concern  . None   Social History Narrative  . None    Family History  Problem Relation Age of Onset  . Hypertension Mother   . Diabetes Mother   . Kidney disease Mother   . Cancer Father        trachea   . Alzheimer's disease Paternal Grandmother

## 2017-02-10 ENCOUNTER — Other Ambulatory Visit: Payer: Managed Care, Other (non HMO)

## 2017-02-10 ENCOUNTER — Encounter: Payer: Self-pay | Admitting: *Deleted

## 2017-02-10 DIAGNOSIS — R7303 Prediabetes: Secondary | ICD-10-CM

## 2017-02-11 LAB — GLUCOSE TOLERANCE, 2 HOURS W/ 1HR
Glucose, 1 hour: 170 mg/dL (ref 65–179)
Glucose, 2 hour: 144 mg/dL (ref 65–152)
Glucose, Fasting: 105 mg/dL — ABNORMAL HIGH (ref 65–91)

## 2017-02-13 ENCOUNTER — Telehealth: Payer: Self-pay | Admitting: *Deleted

## 2017-02-13 ENCOUNTER — Other Ambulatory Visit: Payer: Self-pay | Admitting: Women's Health

## 2017-02-13 ENCOUNTER — Other Ambulatory Visit: Payer: Managed Care, Other (non HMO)

## 2017-02-13 ENCOUNTER — Ambulatory Visit: Payer: Managed Care, Other (non HMO) | Admitting: Women's Health

## 2017-02-13 DIAGNOSIS — R7309 Other abnormal glucose: Secondary | ICD-10-CM

## 2017-02-13 DIAGNOSIS — Z8632 Personal history of gestational diabetes: Secondary | ICD-10-CM

## 2017-02-13 NOTE — Telephone Encounter (Signed)
LMOVM to return call.

## 2017-02-14 ENCOUNTER — Telehealth: Payer: Self-pay | Admitting: *Deleted

## 2017-02-14 NOTE — Telephone Encounter (Signed)
LMOVM that pp gtt was abnormal and needs A1C drawn. Advised to call and schedule appt.

## 2017-02-20 ENCOUNTER — Encounter: Payer: Self-pay | Admitting: Women's Health

## 2017-02-20 ENCOUNTER — Ambulatory Visit (INDEPENDENT_AMBULATORY_CARE_PROVIDER_SITE_OTHER): Payer: Managed Care, Other (non HMO) | Admitting: Women's Health

## 2017-02-20 VITALS — BP 140/80 | HR 84 | Wt 223.0 lb

## 2017-02-20 DIAGNOSIS — Z30430 Encounter for insertion of intrauterine contraceptive device: Secondary | ICD-10-CM | POA: Diagnosis not present

## 2017-02-20 DIAGNOSIS — Z3202 Encounter for pregnancy test, result negative: Secondary | ICD-10-CM

## 2017-02-20 DIAGNOSIS — I10 Essential (primary) hypertension: Secondary | ICD-10-CM

## 2017-02-20 LAB — POCT URINE PREGNANCY: Preg Test, Ur: NEGATIVE

## 2017-02-20 NOTE — Progress Notes (Signed)
Tina Wall is a 36 y.o. year old G15P4004 African American female who presents for placement of a Mirena IUD. She is ~8wks s/p RCS. Has CHTN, on norvasc 5mg  daily- hasn't taken today, states she usually takes at night.  Had A2DM during pregnancy, was also pre-diabetic prior to pregnancy. Repeat 2hr GTT 6/22 elevated 105/170/144, needs A1C.   Patient's last menstrual period was 01/30/2017. BP 140/80   Pulse 84   Wt 223 lb (101.2 kg)   LMP 01/30/2017   BMI 42.14 kg/m  Last sexual intercourse was >2wks ago, and pregnancy test today was neg  The risks and benefits of the method and placement have been thouroughly reviewed with the patient and all questions were answered.  Specifically the patient is aware of failure rate of 08/998, expulsion of the IUD and of possible perforation.  The patient is aware of irregular bleeding due to the method and understands the incidence of irregular bleeding diminishes with time.  Signed copy of informed consent in chart.   Time out was performed.  A graves speculum was placed in the vagina.  The cervix was visualized, prepped using Betadine, and grasped with a single tooth tenaculum. The uterus was found to be neutral and it sounded to 8 cm.  Mirena IUD placed per manufacturer's recommendations.   The strings were trimmed to 3 cm.  Sonogram was performed by amber, ultrasonographer and the proper placement of the IUD was verified via transvaginal u/s.   The patient was given post procedure instructions, including signs and symptoms of infection and to check for the strings after each menses or each month, and refraining from intercourse or anything in the vagina for 3 days.  She was given a Mirena care card with date IUD placed, and date IUD to be removed.  She is scheduled for a f/u appointment in 4 weeks.  Tawnya Crook CNM, [facility] 02/20/2017 4:40 PM

## 2017-02-20 NOTE — Patient Instructions (Signed)
 Nothing in vagina for 3 days (no sex, douching, tampons, etc...)  Check your strings once a month to make sure you can feel them, if you are not able to please let us know  If you develop a fever of 100.4 or more in the next few weeks, or if you develop severe abdominal pain, please let us know  Use a backup method of birth control, such as condoms, for 2 weeks   Levonorgestrel intrauterine device (IUD) What is this medicine? LEVONORGESTREL IUD (LEE voe nor jes trel) is a contraceptive (birth control) device. The device is placed inside the uterus by a healthcare professional. It is used to prevent pregnancy. This device can also be used to treat heavy bleeding that occurs during your period. This medicine may be used for other purposes; ask your health care provider or pharmacist if you have questions. COMMON BRAND NAME(S): Kyleena, LILETTA, Mirena, Skyla What should I tell my health care provider before I take this medicine? They need to know if you have any of these conditions: -abnormal Pap smear -cancer of the breast, uterus, or cervix -diabetes -endometritis -genital or pelvic infection now or in the past -have more than one sexual partner or your partner has more than one partner -heart disease -history of an ectopic or tubal pregnancy -immune system problems -IUD in place -liver disease or tumor -problems with blood clots or take blood-thinners -seizures -use intravenous drugs -uterus of unusual shape -vaginal bleeding that has not been explained -an unusual or allergic reaction to levonorgestrel, other hormones, silicone, or polyethylene, medicines, foods, dyes, or preservatives -pregnant or trying to get pregnant -breast-feeding How should I use this medicine? This device is placed inside the uterus by a health care professional. Talk to your pediatrician regarding the use of this medicine in children. Special care may be needed. Overdosage: If you think you have  taken too much of this medicine contact a poison control center or emergency room at once. NOTE: This medicine is only for you. Do not share this medicine with others. What if I miss a dose? This does not apply. Depending on the brand of device you have inserted, the device will need to be replaced every 3 to 5 years if you wish to continue using this type of birth control. What may interact with this medicine? Do not take this medicine with any of the following medications: -amprenavir -bosentan -fosamprenavir This medicine may also interact with the following medications: -aprepitant -armodafinil -barbiturate medicines for inducing sleep or treating seizures -bexarotene -boceprevir -griseofulvin -medicines to treat seizures like carbamazepine, ethotoin, felbamate, oxcarbazepine, phenytoin, topiramate -modafinil -pioglitazone -rifabutin -rifampin -rifapentine -some medicines to treat HIV infection like atazanavir, efavirenz, indinavir, lopinavir, nelfinavir, tipranavir, ritonavir -St. John's wort -warfarin This list may not describe all possible interactions. Give your health care provider a list of all the medicines, herbs, non-prescription drugs, or dietary supplements you use. Also tell them if you smoke, drink alcohol, or use illegal drugs. Some items may interact with your medicine. What should I watch for while using this medicine? Visit your doctor or health care professional for regular check ups. See your doctor if you or your partner has sexual contact with others, becomes HIV positive, or gets a sexual transmitted disease. This product does not protect you against HIV infection (AIDS) or other sexually transmitted diseases. You can check the placement of the IUD yourself by reaching up to the top of your vagina with clean fingers to feel the threads. Do   not pull on the threads. It is a good habit to check placement after each menstrual period. Call your doctor right away if  you feel more of the IUD than just the threads or if you cannot feel the threads at all. The IUD may come out by itself. You may become pregnant if the device comes out. If you notice that the IUD has come out use a backup birth control method like condoms and call your health care provider. Using tampons will not change the position of the IUD and are okay to use during your period. This IUD can be safely scanned with magnetic resonance imaging (MRI) only under specific conditions. Before you have an MRI, tell your healthcare provider that you have an IUD in place, and which type of IUD you have in place. What side effects may I notice from receiving this medicine? Side effects that you should report to your doctor or health care professional as soon as possible: -allergic reactions like skin rash, itching or hives, swelling of the face, lips, or tongue -fever, flu-like symptoms -genital sores -high blood pressure -no menstrual period for 6 weeks during use -pain, swelling, warmth in the leg -pelvic pain or tenderness -severe or sudden headache -signs of pregnancy -stomach cramping -sudden shortness of breath -trouble with balance, talking, or walking -unusual vaginal bleeding, discharge -yellowing of the eyes or skin Side effects that usually do not require medical attention (report to your doctor or health care professional if they continue or are bothersome): -acne -breast pain -change in sex drive or performance -changes in weight -cramping, dizziness, or faintness while the device is being inserted -headache -irregular menstrual bleeding within first 3 to 6 months of use -nausea This list may not describe all possible side effects. Call your doctor for medical advice about side effects. You may report side effects to FDA at 1-800-FDA-1088. Where should I keep my medicine? This does not apply. NOTE: This sheet is a summary. It may not cover all possible information. If you have  questions about this medicine, talk to your doctor, pharmacist, or health care provider.  2018 Elsevier/Gold Standard (2016-05-20 14:14:56)  Intrauterine Device Insertion, Care After This sheet gives you information about how to care for yourself after your procedure. Your health care provider may also give you more specific instructions. If you have problems or questions, contact your health care provider. What can I expect after the procedure? After the procedure, it is common to have:  Cramps and pain in the abdomen.  Light bleeding (spotting) or heavier bleeding that is like your menstrual period. This may last for up to a few days.  Lower back pain.  Dizziness.  Headaches.  Nausea.  Follow these instructions at home:  Before resuming sexual activity, check to make sure that you can feel the IUD string(s). You should be able to feel the end of the string(s) below the opening of your cervix. If your IUD string is in place, you may resume sexual activity. ? If you had a hormonal IUD inserted more than 7 days after your most recent period started, you will need to use a backup method of birth control for 7 days after IUD insertion. Ask your health care provider whether this applies to you.  Continue to check that the IUD is still in place by feeling for the string(s) after every menstrual period, or once a month.  Take over-the-counter and prescription medicines only as told by your health care provider.  Do  not drive or use heavy machinery while taking prescription pain medicine.  Keep all follow-up visits as told by your health care provider. This is important. Contact a health care provider if:  You have bleeding that is heavier or lasts longer than a normal menstrual cycle.  You have a fever.  You have cramps or abdominal pain that get worse or do not get better with medicine.  You develop abdominal pain that is new or is not in the same area of earlier cramping and  pain.  You feel lightheaded or weak.  You have abnormal or bad-smelling discharge from your vagina.  You have pain during sexual activity.  You have any of the following problems with your IUD string(s): ? The string bothers or hurts you or your sexual partner. ? You cannot feel the string. ? The string has gotten longer.  You can feel the IUD in your vagina.  You think you may be pregnant, or you miss your menstrual period.  You think you may have an STI (sexually transmitted infection). Get help right away if:  You have flu-like symptoms.  You have a fever and chills.  You can feel that your IUD has slipped out of place. Summary  After the procedure, it is common to have cramps and pain in the abdomen. It is also common to have light bleeding (spotting) or heavier bleeding that is like your menstrual period.  Continue to check that the IUD is still in place by feeling for the string(s) after every menstrual period, or once a month.  Keep all follow-up visits as told by your health care provider. This is important.  Contact your health care provider if you have problems with your IUD string(s), such as the string getting longer or bothering you or your sexual partner. This information is not intended to replace advice given to you by your health care provider. Make sure you discuss any questions you have with your health care provider. Document Released: 04/06/2011 Document Revised: 06/29/2016 Document Reviewed: 06/29/2016 Elsevier Interactive Patient Education  2017 [employer].

## 2017-02-27 DIAGNOSIS — Z029 Encounter for administrative examinations, unspecified: Secondary | ICD-10-CM

## 2017-03-01 ENCOUNTER — Telehealth: Payer: Self-pay | Admitting: Gastroenterology

## 2017-03-01 ENCOUNTER — Ambulatory Visit: Payer: Managed Care, Other (non HMO) | Admitting: Gastroenterology

## 2017-03-01 ENCOUNTER — Encounter: Payer: Self-pay | Admitting: Gastroenterology

## 2017-03-01 NOTE — Telephone Encounter (Signed)
PT WAS A NO SHOW AND LETTER SENT  °

## 2017-03-20 ENCOUNTER — Other Ambulatory Visit: Payer: Managed Care, Other (non HMO) | Admitting: Women's Health

## 2017-04-11 ENCOUNTER — Encounter: Payer: Self-pay | Admitting: *Deleted

## 2017-04-11 ENCOUNTER — Other Ambulatory Visit: Payer: Managed Care, Other (non HMO) | Admitting: Women's Health

## 2017-05-19 ENCOUNTER — Other Ambulatory Visit: Payer: Self-pay | Admitting: Women's Health

## 2017-06-02 IMAGING — CR DG CHEST 2V
2 series · 2 of 2 positions shown · non-contrast
Comparison: Chest radiograph dated 09/24/2012

ADDENDUM:
Please note there is a typographical error in the clinical data
section of the report. The correct history should read: 35-year-old
female, 25 week pregnant presenting with cough and dyspnea.
CLINICAL DATA: 35-year-old female, 25 rib fragment presenting with
cough and dyspnea.

EXAM:
CHEST  2 VIEW

[chest pa]
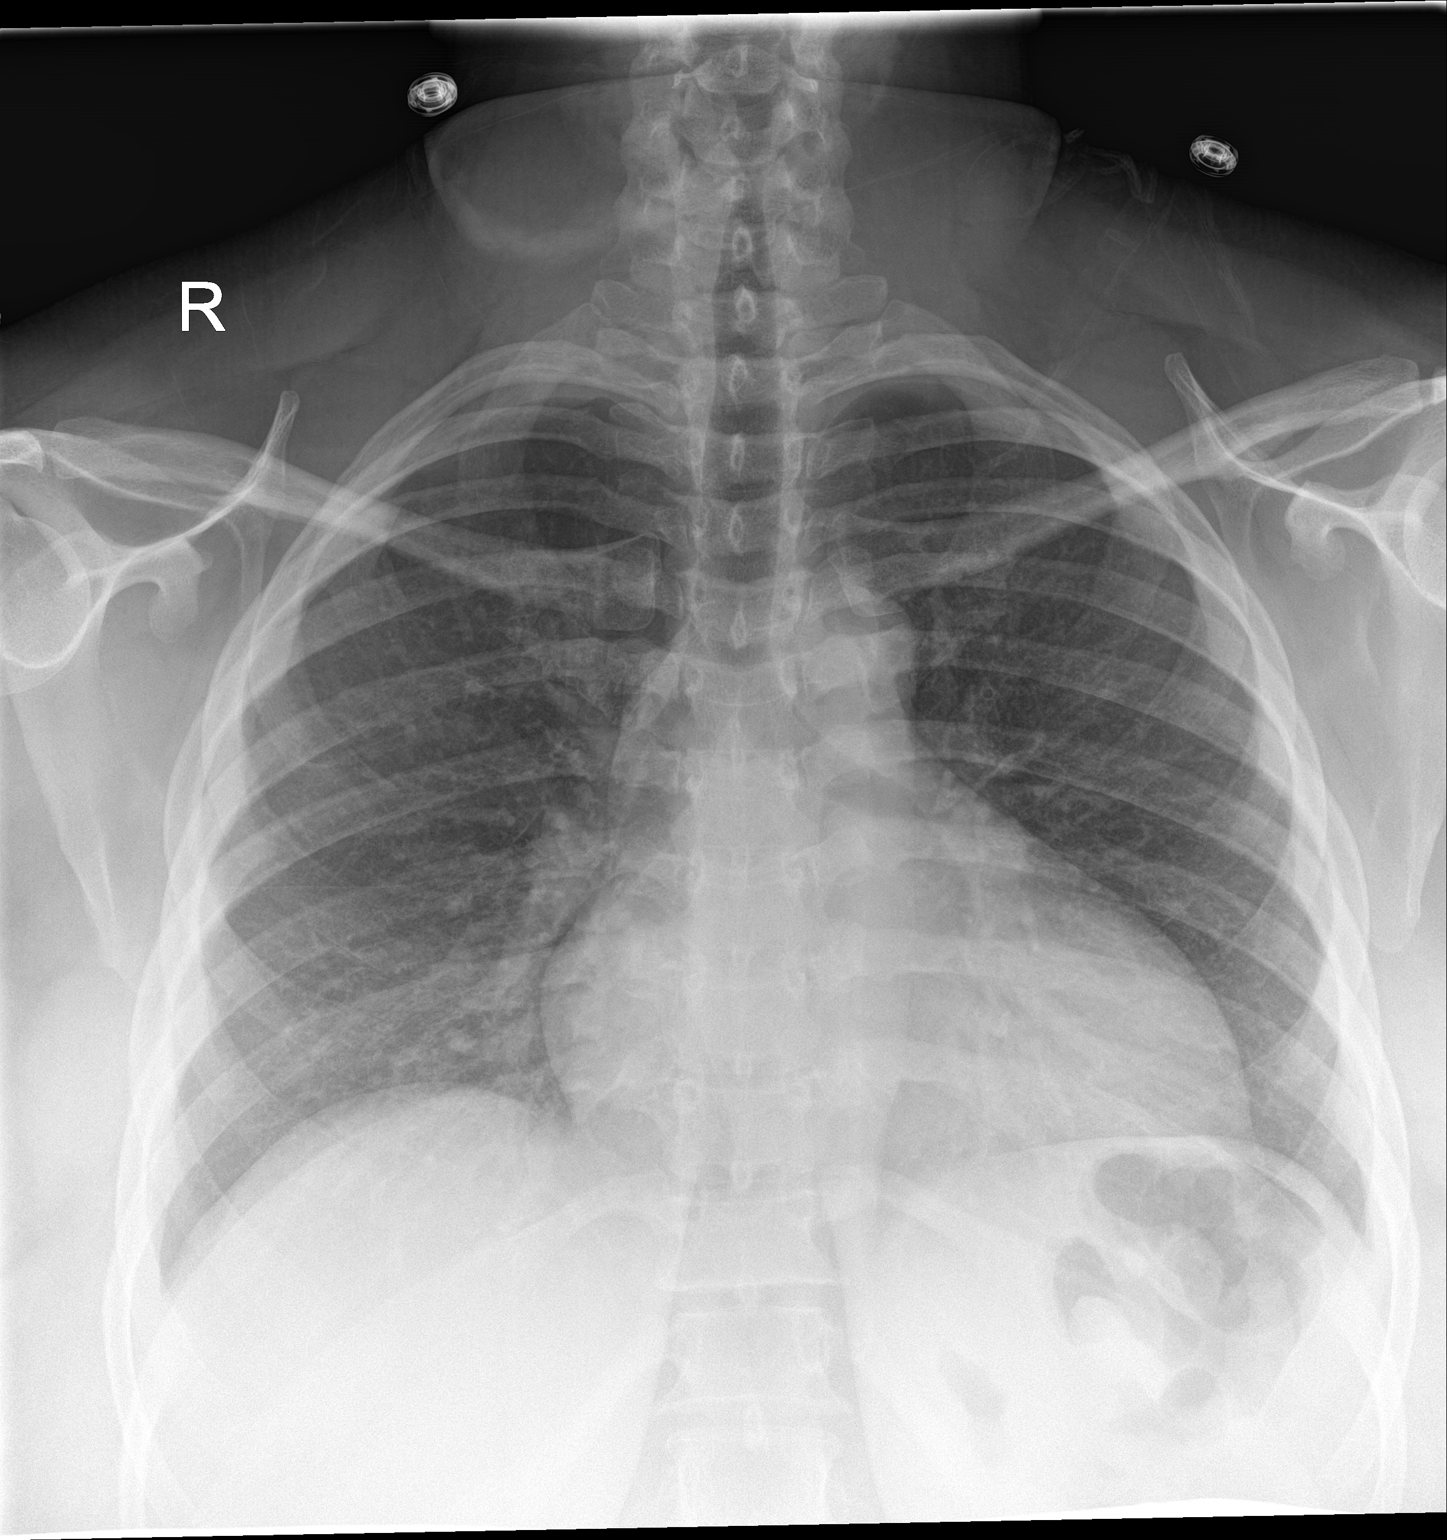

[chest lat]
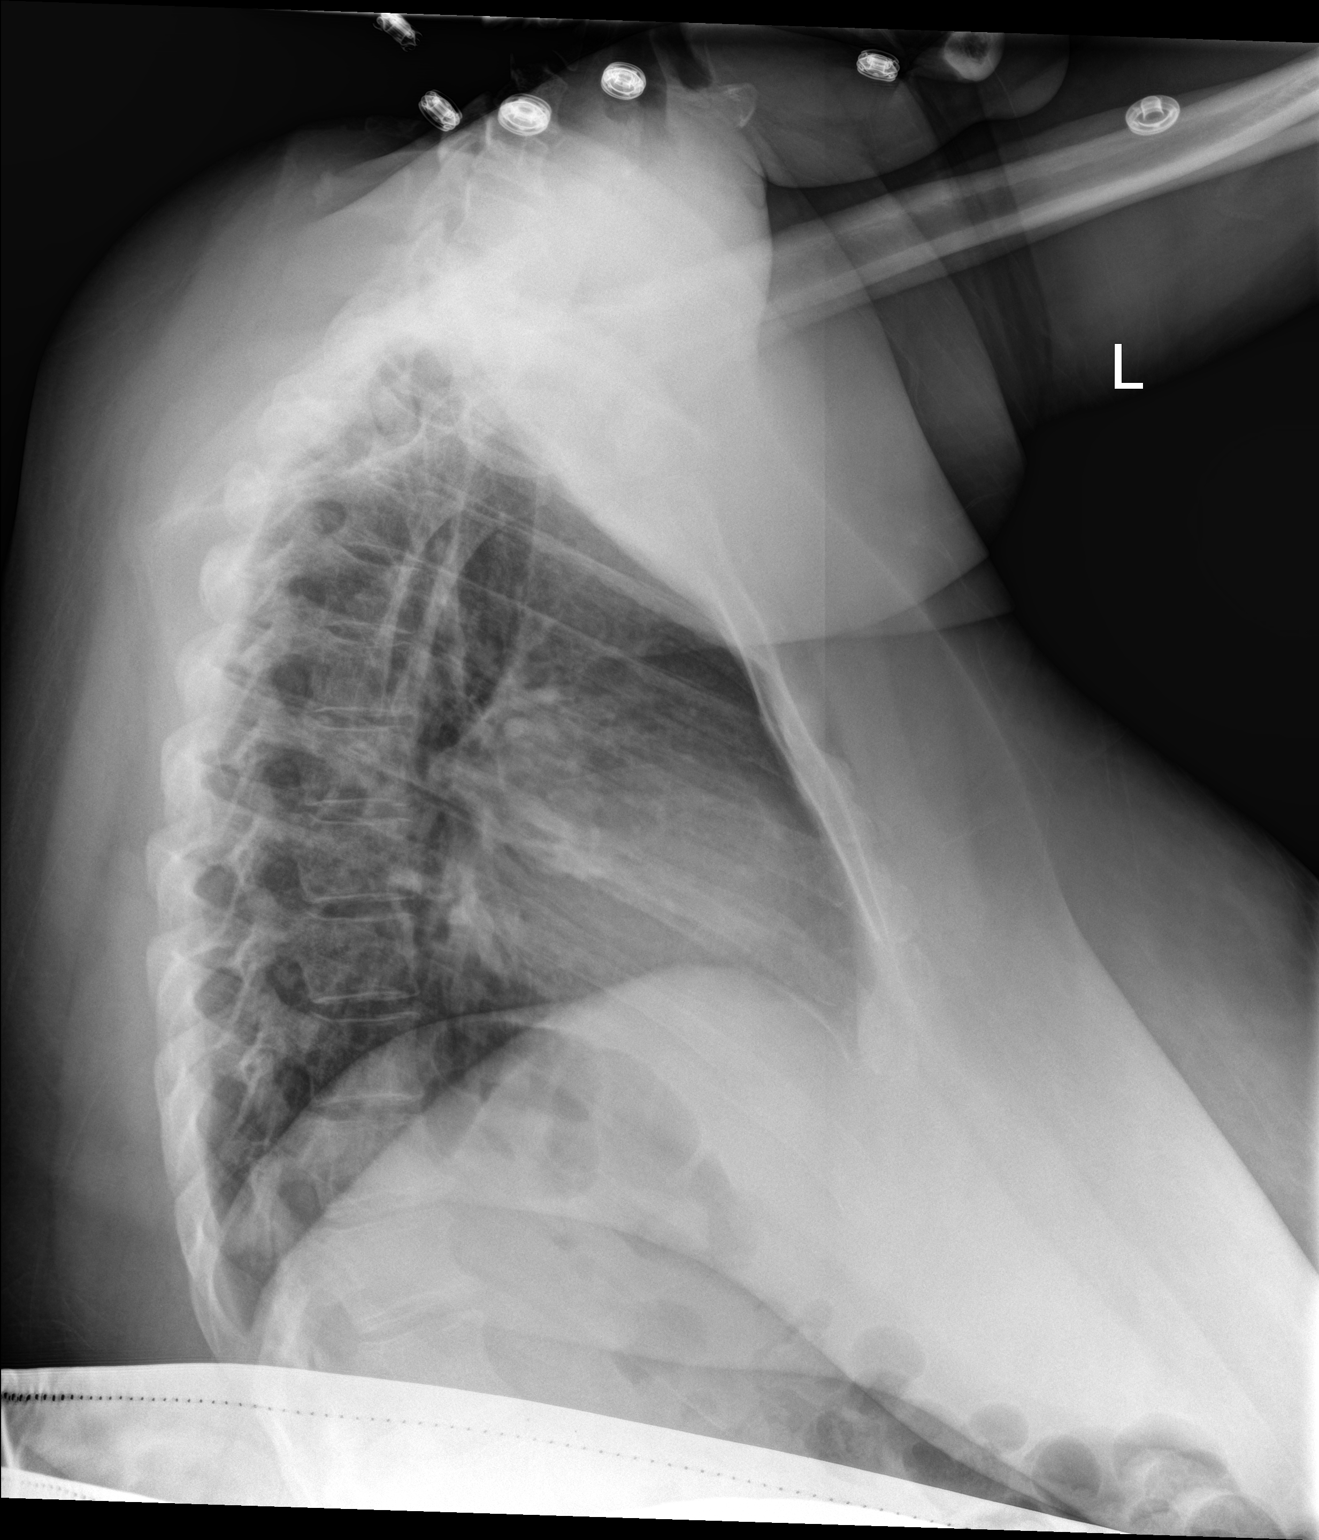

[2 of 2 positions shown; findings below may reference images not displayed]

FINDINGS: There is mild cardiomegaly, relatively similar to prior radiograph
accounting for difference in the degree of inspiration. No vascular
congestion or edema. There is no focal consolidation, pleural
effusion, or pneumothorax. No acute osseous pathology.
IMPRESSION: Mild cardiomegaly.  No vascular congestion or edema.

## 2017-08-23 ENCOUNTER — Encounter: Payer: Managed Care, Other (non HMO) | Admitting: Primary Care

## 2017-08-23 DIAGNOSIS — Z0289 Encounter for other administrative examinations: Secondary | ICD-10-CM

## 2017-12-22 ENCOUNTER — Encounter (HOSPITAL_COMMUNITY): Payer: Self-pay | Admitting: Emergency Medicine

## 2017-12-22 ENCOUNTER — Emergency Department (HOSPITAL_COMMUNITY)
Admission: EM | Admit: 2017-12-22 | Discharge: 2017-12-22 | Disposition: A | Discharge status: Home / Self Care | Payer: Self-pay | Attending: Emergency Medicine | Admitting: Emergency Medicine

## 2017-12-22 ENCOUNTER — Emergency Department (HOSPITAL_COMMUNITY): Payer: Self-pay

## 2017-12-22 ENCOUNTER — Other Ambulatory Visit: Payer: Self-pay

## 2017-12-22 ENCOUNTER — Ambulatory Visit: Payer: Self-pay

## 2017-12-22 DIAGNOSIS — R0981 Nasal congestion: Secondary | ICD-10-CM

## 2017-12-22 DIAGNOSIS — E876 Hypokalemia: Secondary | ICD-10-CM

## 2017-12-22 DIAGNOSIS — R059 Cough, unspecified: Secondary | ICD-10-CM

## 2017-12-22 DIAGNOSIS — R0789 Other chest pain: Secondary | ICD-10-CM

## 2017-12-22 DIAGNOSIS — J45909 Unspecified asthma, uncomplicated: Secondary | ICD-10-CM | POA: Insufficient documentation

## 2017-12-22 DIAGNOSIS — Z79899 Other long term (current) drug therapy: Secondary | ICD-10-CM | POA: Insufficient documentation

## 2017-12-22 DIAGNOSIS — G44209 Tension-type headache, unspecified, not intractable: Secondary | ICD-10-CM

## 2017-12-22 DIAGNOSIS — R05 Cough: Secondary | ICD-10-CM | POA: Insufficient documentation

## 2017-12-22 DIAGNOSIS — I10 Essential (primary) hypertension: Secondary | ICD-10-CM | POA: Insufficient documentation

## 2017-12-22 DIAGNOSIS — E119 Type 2 diabetes mellitus without complications: Secondary | ICD-10-CM | POA: Insufficient documentation

## 2017-12-22 LAB — BASIC METABOLIC PANEL
Anion gap: 10 (ref 5–15)
BUN: 8 mg/dL (ref 6–20)
CO2: 24 mmol/L (ref 22–32)
Calcium: 9 mg/dL (ref 8.9–10.3)
Chloride: 102 mmol/L (ref 101–111)
Creatinine, Ser: 0.79 mg/dL (ref 0.44–1.00)
GFR calc Af Amer: >60 mL/min (ref >60)
GFR calc non Af Amer: >60 mL/min (ref >60)
Glucose, Bld: 109 mg/dL — ABNORMAL HIGH (ref 65–99)
Potassium: 3.2 mmol/L — ABNORMAL LOW (ref 3.5–5.1)
Sodium: 136 mmol/L (ref 135–145)

## 2017-12-22 LAB — CBC WITH DIFFERENTIAL/PLATELET
Basophils Absolute: 0 10*3/uL (ref 0.0–0.1)
Basophils Relative: 1 %
Eosinophils Absolute: 0.4 10*3/uL (ref 0.0–0.7)
Eosinophils Relative: 7 %
HCT: 34.9 % — ABNORMAL LOW (ref 36.0–46.0)
Hemoglobin: 11.2 g/dL — ABNORMAL LOW (ref 12.0–15.0)
Lymphocytes Relative: 59 %
Lymphs Abs: 3.1 10*3/uL (ref 0.7–4.0)
MCH: 28.6 pg (ref 26.0–34.0)
MCHC: 32.1 g/dL (ref 30.0–36.0)
MCV: 89.3 fL (ref 78.0–100.0)
Monocytes Absolute: 0.4 10*3/uL (ref 0.1–1.0)
Monocytes Relative: 7 %
Neutro Abs: 1.4 10*3/uL — ABNORMAL LOW (ref 1.7–7.7)
Neutrophils Relative %: 26 %
Platelets: 270 10*3/uL (ref 150–400)
RBC: 3.91 MIL/uL (ref 3.87–5.11)
RDW: 15 % (ref 11.5–15.5)
WBC: 5.2 10*3/uL (ref 4.0–10.5)

## 2017-12-22 MED ORDER — IBUPROFEN 600 MG PO TABS: 600.0000 mg | ORAL_TABLET | Freq: Four times a day (QID) | ORAL | 0 refills | Status: DC | PRN

## 2017-12-22 MED ORDER — PREDNISONE 20 MG PO TABS: ORAL_TABLET | ORAL | 0 refills | Status: DC

## 2017-12-22 MED ORDER — METHOCARBAMOL 500 MG PO TABS: 1000.0000 mg | ORAL_TABLET | Freq: Three times a day (TID) | ORAL | 0 refills | Status: DC | PRN

## 2017-12-22 MED ORDER — POTASSIUM CHLORIDE CRYS ER 20 MEQ PO TBCR
40.0000 meq | EXTENDED_RELEASE_TABLET | Freq: Once | ORAL | Status: AC
  Administered 2017-12-22: 40 meq via ORAL
  Filled 2017-12-22: qty 2

## 2017-12-22 MED ORDER — ALBUTEROL SULFATE HFA 108 (90 BASE) MCG/ACT IN AERS
1.0000 | INHALATION_SPRAY | RESPIRATORY_TRACT | Status: DC | PRN
  Administered 2017-12-22: 2 via RESPIRATORY_TRACT
  Filled 2017-12-22: qty 6.7

## 2017-12-22 MED ORDER — AMLODIPINE BESYLATE 5 MG PO TABS: 5.0000 mg | ORAL_TABLET | Freq: Every day | ORAL | 0 refills | Status: DC

## 2017-12-22 MED ORDER — FLUTICASONE PROPIONATE 50 MCG/ACT NA SUSP: 2.0000 | Freq: Every day | NASAL | 0 refills | Status: DC

## 2017-12-22 NOTE — ED Triage Notes (Signed)
Patient has multiple complaints. Per patient increased blood pressure with headache and productive cough x3 days. Patient states thick yellow sputum. Unsure of any fevers. Patient states takes medication for HTN but has "ran out of her medication." Patient states ran out medication 3 months ago. Patient also c/o left side chest pain and neck pain. Denies any nausea, vomiting, weakness, or dizziness. Hx of enlarged heart. Patient instructed to come to ED by PCP due to chest pain.

## 2017-12-22 NOTE — ED Provider Notes (Signed)
[facility] EMERGENCY DEPARTMENT Provider Note   CSN: [id number] Arrival date & time: 12/22/17  1747     History   Chief Complaint Chief Complaint  Patient presents with  . Cough    HPI Tina Wall is a 37 y.o. female.  HPI With history of asthma, hypertension.  States she is out of her blood pressure medication for several months.  She is had 3 weeks of coughing and wheezing.  She has he has been using an inhaler intermittently.  Cough is mostly dry though occasionally does have some acute sputum production.  Endorses nasal congestion but denies sinus pressure.  States she does have a gradual onset right posterior headache for the past 3 days.  No photophobia.  No focal weakness or numbness.  She occasionally has some sharp left-sided chest pain but denies any pain currently.  Not brought on by exertion.  Denies lower extremity swelling or pain. Past Medical History:  Diagnosis Date  . Asthma   . Bronchitis   . Diabetes mellitus without complication ([place])   . Gestational diabetes   . GI bleeding   . Headache   . Heart murmur   . HSV-2 (herpes simplex virus 2) infection   . Hx of chlamydia infection   . Hx of trichomoniasis   . Hypertension   . Ovarian cyst   . Syphilis     Patient Active Problem List   Diagnosis Date Noted  . Encounter for insertion of mirena IUD 02/20/2017  . Bronchitis with bronchospasm 09/12/2016  . History of gestational diabetes 05/27/2016  . Previous cesarean section 05/23/2016  . Fibroid 05/23/2016  . Asthma 04/12/2016  . Borderline diabetes 04/02/2015  . Essential hypertension 04/02/2015  . 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 CESAREAN SECTION     C/S x 2  . CESAREAN SECTION N/A 12/19/2016   Procedure: REPEAT CESAREAN SECTION;  Surgeon: Florian Buff, MD;  Location: [place];  Service: Obstetrics;  Laterality: N/A;  . FOOT SURGERY    . TONSILLECTOMY      . WISDOM TOOTH EXTRACTION       OB History    Gravida  4   Para  4   Term  4   Preterm      AB      Living  4     SAB      TAB      Ectopic      Multiple  0   Live Births  4            Home Medications    Prior to Admission medications   Medication Sig Start Date End Date Taking? Authorizing Provider  albuterol (PROAIR HFA) 108 (90 Base) MCG/ACT inhaler Inhale 2 puffs into the lungs every 4 (four) hours as needed for wheezing or shortness of breath. Reported on 10/08/2015 Patient not taking: Reported on 02/20/2017 09/20/16   Eloise Levels, MD  amLODipine (NORVASC) 5 MG tablet Take 1 tablet (5 mg total) by mouth daily. 12/22/17   Julianne Rice, MD  fluticasone (FLONASE) 50 MCG/ACT nasal spray Place 2 sprays into both nostrils daily. 12/22/17   Julianne Rice, MD  ibuprofen (ADVIL,MOTRIN) 600 MG tablet Take 1 tablet (600 mg total) by mouth every 6 (six) hours as needed for headache or moderate pain. 12/22/17   Julianne Rice, MD  ipratropium-albuterol (DUONEB) 0.5-2.5 (3) MG/3ML SOLN Take 3 mLs by  nebulization every 4 (four) hours. Patient not taking: Reported on 02/20/2017 09/20/16   Truett Mainland, DO  methocarbamol (ROBAXIN) 500 MG tablet Take 2 tablets (1,000 mg total) by mouth every 8 (eight) hours as needed for muscle spasms. 12/22/17   Julianne Rice, MD  predniSONE (DELTASONE) 20 MG tablet 3 tabs po day one, then 2 po daily x 4 days 12/22/17   Julianne Rice, MD  silver sulfADIAZINE (SILVADENE) 1 % cream Apply 1 application topically 2 (two) times daily. Patient not taking: Reported on 01/23/2017 12/26/16   Roma Schanz, CNM    Family History Family History  Problem Relation Age of Onset  . Hypertension Mother   . Diabetes Mother   . Kidney disease Mother   . Cancer Father        trachea   . Alzheimer's disease Paternal Grandmother     Social History Social History   Tobacco Use  . Smoking status: Never Smoker  . Smokeless tobacco: Never Used   Substance Use Topics  . Alcohol use: Yes    Alcohol/week: 0.0 oz    Comment: occasionally  . Drug use: No     Allergies   Ace inhibitors and Latex   Review of Systems Review of Systems  Constitutional: Negative for chills and fever.  HENT: Positive for congestion. Negative for sinus pressure, sinus pain and sore throat.   Eyes: Negative for photophobia and visual disturbance.  Respiratory: Positive for cough, shortness of breath and wheezing. Negative for chest tightness.   Cardiovascular: Positive for chest pain. Negative for palpitations and leg swelling.  Gastrointestinal: Negative for abdominal pain, diarrhea, nausea and vomiting.  Musculoskeletal: Positive for myalgias and neck pain. Negative for back pain and neck stiffness.  Skin: Negative for rash and wound.  Neurological: Positive for headaches. Negative for dizziness, syncope, speech difficulty, weakness, light-headedness and numbness.  All other systems reviewed and are negative.    Physical Exam Updated Vital Signs BP (!) 153/106 (BP Location: Right Arm)   Pulse 77   Temp 98.6 F (37 C) (Oral)   Resp 20   Ht 5' (1.524 m)   Wt 105.7 kg (233 lb)   SpO2 100%   BMI 45.50 kg/m   Physical Exam  Constitutional: She is oriented to person, place, and time. She appears well-developed and well-nourished. No distress.  HENT:  Head: Normocephalic and atraumatic.  Mouth/Throat: Oropharynx is clear and moist.  Bilateral nasal mucosal edema.  Oropharynx is clear.  No sinus tenderness to percussion.  Patient has tenderness to palpation over the right posterior scalp extending to the right trapezius musculature.    Eyes: Pupils are equal, round, and reactive to light. EOM are normal.  Neck: Normal range of motion. Neck supple.  She has no posterior midline cervical tenderness to palpation.  No meningismus.    Cardiovascular: Normal rate and regular rhythm. Exam reveals no gallop and no friction rub.  No murmur  heard. Pulmonary/Chest: Effort normal and breath sounds normal. No stridor. No respiratory distress. She has no wheezes. She has no rales. She exhibits no tenderness.  Mildly prolonged expiratory phase.  Abdominal: Soft. Bowel sounds are normal. There is no tenderness. There is no rebound and no guarding.  Musculoskeletal: Normal range of motion. She exhibits no edema or tenderness.  No lower extremity swelling, asymmetry or tenderness.  Distal pulses are 2+.  Lymphadenopathy:    She has no cervical adenopathy.  Neurological: She is alert and oriented to person, place, and time.  Moves all extremities without focal deficit.  Sensation fully intact.  Skin: Skin is warm and dry. Capillary refill takes less than 2 seconds. No rash noted. She is not diaphoretic. No erythema.  Psychiatric: She has a normal mood and affect. Her behavior is normal.  Nursing note and vitals reviewed.    ED Treatments / Results  Labs (all labs ordered are listed, but only abnormal results are displayed) Labs Reviewed  CBC WITH DIFFERENTIAL/PLATELET - Abnormal; Notable for the following components:      Result Value   Hemoglobin 11.2 (*)    HCT 34.9 (*)    Neutro Abs 1.4 (*)    All other components within normal limits  BASIC METABOLIC PANEL - Abnormal; Notable for the following components:   Potassium 3.2 (*)    Glucose, Bld 109 (*)    All other components within normal limits    EKG EKG Interpretation  Date/Time:  Friday Dec 22 2017 18:10:50 EDT Ventricular Rate:  87 PR Interval:  164 QRS Duration: 72 QT Interval:  366 QTC Calculation: 440 R Axis:   8 Text Interpretation:  Normal sinus rhythm Possible Left atrial enlargement Borderline ECG Confirmed by Julianne Rice [phone]) on 12/22/2017 8:49:59 PM   Radiology Dg Chest 2 View  Result Date: 12/22/2017 CLINICAL DATA:  Productive cough for 3 days. Hypertension and headache. EXAM: CHEST - 2 VIEW COMPARISON:  Two-view chest x-ray 09/16/2016  FINDINGS: Heart size is normal. There is no edema or effusion to suggest failure. No focal airspace disease is present. The visualized soft tissues and bony thorax are unremarkable. IMPRESSION: Negative two view chest x-ray Electronically Signed   By: San Morelle M.D.   On: 12/22/2017 18:32    Procedures Procedures (including critical care time)  Medications Ordered in ED Medications  potassium chloride SA (K-DUR,KLOR-CON) CR tablet 40 mEq (has no administration in time range)  albuterol (PROVENTIL HFA;VENTOLIN HFA) 108 (90 Base) MCG/ACT inhaler 1-2 puff (has no administration in time range)     Initial Impression / Assessment and Plan / ED Course  I have reviewed the triage vital signs and the nursing notes.  Pertinent labs & imaging results that were available during my care of the patient were reviewed by me and considered in my medical decision making (see chart for details).    Chest pain is very atypical.  EKG with no ischemic findings.  Likely related to her chronic cough and wheezing.  Will start on short course of prednisone and have given inhaler in the emergency department.  Patient's headache is reproduced with palpation over the musculature of the occipital scalp.  Likely tension related.  Normal neurologic exam.  Do not believe that imaging is necessary at this point.  We will treat symptomatically.  Mildly elevated blood pressure.  Will refill blood pressure medication.  Patient is encouraged to follow-up closely with her primary physician for management.  Potassium is a little low.  Given oral replacement in the emergency department.  Strict return precautions have been given.   Final Clinical Impressions(s) / ED Diagnoses   Final diagnoses:  Cough  Atypical chest pain  Nasal sinus congestion  Hypokalemia  Tension headache    ED Discharge Orders        Ordered    amLODipine (NORVASC) 5 MG tablet  Daily     12/22/17 2106    predniSONE (DELTASONE) 20 MG  tablet     12/22/17 2106    fluticasone (FLONASE) 50 MCG/ACT nasal spray  Daily     12/22/17 2106    methocarbamol (ROBAXIN) 500 MG tablet  Every 8 hours PRN     12/22/17 2106    ibuprofen (ADVIL,MOTRIN) 600 MG tablet  Every 6 hours PRN     12/22/17 2106       Julianne Rice, MD 12/22/17 2114

## 2017-12-22 NOTE — Telephone Encounter (Signed)
C/o headache, elevated BP and "mild" chest discomfort x 2 days.  BP 156/96 @ 12:00 PM, and 156/108 @ 12:05 PM.  Stated chest pain is mild in left chest; denied radiation of pain.  Further explained she is having "sharp" pain in the right and left chest, intermittently.  C/o feeling like she is losing her breath at times, when she coughs.  Reported coughing up yellow mucus. Unsure if she has fever.  Denied dizziness, blurred vision, speech problems, weakness in unilateral extremities, or balance issues.  Reported she has been out of her BP medication for 3 months, due to lack of insurance, and inability to get into the office for refills.  Recommended to go to ER for evaluation due to uncontrolled BP, headache, and chest pain.  Voiced concern of not having insurance.  Encouraged to go to ER and that they will treat her with Medicaid.  Pt. verb. understanding.        Reason for Disposition . [1] Systolic BP  >= 245 OR Diastolic >= 809 AND [9] cardiac or neurologic symptoms (e.g., chest pain, difficulty breathing, unsteady gait, blurred vision)    C/o elevated BP, headache, and chest discomfort for 2 days.  Answer Assessment - Initial Assessment Questions 1. BLOOD PRESSURE: "What is the blood pressure?" "Did you take at least two measurements 5 minutes apart?"     156/96 @ approx. 12:00 PM;   156/108 @ approx. 12:05 PM 2. ONSET: "When did you take your blood pressure?"    See above  3. HOW: "How did you obtain the blood pressure?" (e.g., visiting nurse, automatic home BP monitor)     Digital machine 4. HISTORY: "Do you have a history of high blood pressure?"     Yes; has been out of BP medication for about 3 mos.  5. MEDICATIONS: "Are you taking any medications for blood pressure?" "Have you missed any doses recently?"     Out of medication x 3 mos. 6. OTHER SYMPTOMS: "Do you have any symptoms?" (e.g., headache, chest pain, blurred vision, difficulty breathing, weakness)     Headache for 2 days; c/o  "mild" intermittent left ant. chest pain x 2 days; denied radiation of pain; Feels she is losing her breath with cough; coughing up yellow mucus; unsure if has fever;  c/o intermittent pain in upper body /right and left chest.; denied dizziness, blurring of vision, facial droop, speech difficulties, weakness of unilateral extremities, balance,     7. PREGNANCY: "Is there any chance you are pregnant?" "When was your last menstrual period?"     No; has IUD  Protocols used: HIGH BLOOD PRESSURE-A-AH

## 2017-12-22 NOTE — Telephone Encounter (Signed)
Noted and agree. Patient has not been seen since December 2017 and will need re-evaluation for refills.

## 2017-12-28 ENCOUNTER — Ambulatory Visit: Payer: Self-pay | Admitting: Primary Care

## 2018-01-19 ENCOUNTER — Ambulatory Visit: Payer: Self-pay | Admitting: Primary Care

## 2018-02-09 ENCOUNTER — Ambulatory Visit: Payer: Self-pay | Admitting: Primary Care

## 2018-12-22 ENCOUNTER — Emergency Department: Payer: Self-pay

## 2018-12-22 ENCOUNTER — Encounter: Payer: Self-pay | Admitting: Emergency Medicine

## 2018-12-22 ENCOUNTER — Other Ambulatory Visit: Payer: Self-pay

## 2018-12-22 ENCOUNTER — Emergency Department
Admission: EM | Admit: 2018-12-22 | Discharge: 2018-12-22 | Disposition: A | Discharge status: Home / Self Care | Payer: Self-pay | Attending: Emergency Medicine | Admitting: Emergency Medicine

## 2018-12-22 DIAGNOSIS — J45909 Unspecified asthma, uncomplicated: Secondary | ICD-10-CM | POA: Insufficient documentation

## 2018-12-22 DIAGNOSIS — Z79899 Other long term (current) drug therapy: Secondary | ICD-10-CM | POA: Insufficient documentation

## 2018-12-22 DIAGNOSIS — I10 Essential (primary) hypertension: Secondary | ICD-10-CM

## 2018-12-22 DIAGNOSIS — F419 Anxiety disorder, unspecified: Secondary | ICD-10-CM

## 2018-12-22 DIAGNOSIS — E119 Type 2 diabetes mellitus without complications: Secondary | ICD-10-CM | POA: Insufficient documentation

## 2018-12-22 DIAGNOSIS — R079 Chest pain, unspecified: Secondary | ICD-10-CM

## 2018-12-22 LAB — CBC WITH DIFFERENTIAL/PLATELET
Abs Immature Granulocytes: 0.01 10*3/uL (ref 0.00–0.07)
Basophils Absolute: 0.1 10*3/uL (ref 0.0–0.1)
Basophils Relative: 1 %
Eosinophils Absolute: 0.4 10*3/uL (ref 0.0–0.5)
Eosinophils Relative: 8 %
HCT: 38.6 % (ref 36.0–46.0)
Hemoglobin: 12.8 g/dL (ref 12.0–15.0)
Immature Granulocytes: 0 %
Lymphocytes Relative: 58 %
Lymphs Abs: 3.4 10*3/uL (ref 0.7–4.0)
MCH: 30 pg (ref 26.0–34.0)
MCHC: 33.2 g/dL (ref 30.0–36.0)
MCV: 90.4 fL (ref 80.0–100.0)
Monocytes Absolute: 0.4 10*3/uL (ref 0.1–1.0)
Monocytes Relative: 6 %
Neutro Abs: 1.6 10*3/uL — ABNORMAL LOW (ref 1.7–7.7)
Neutrophils Relative %: 27 %
Platelets: 264 10*3/uL (ref 150–400)
RBC: 4.27 MIL/uL (ref 3.87–5.11)
RDW: 12.8 % (ref 11.5–15.5)
WBC: 5.9 10*3/uL (ref 4.0–10.5)
nRBC: 0 % (ref 0.0–0.2)

## 2018-12-22 LAB — POCT PREGNANCY, URINE: Preg Test, Ur: NEGATIVE

## 2018-12-22 LAB — BASIC METABOLIC PANEL
Anion gap: 7 (ref 5–15)
BUN: 14 mg/dL (ref 6–20)
CO2: 26 mmol/L (ref 22–32)
Calcium: 9 mg/dL (ref 8.9–10.3)
Chloride: 104 mmol/L (ref 98–111)
Creatinine, Ser: 1.02 mg/dL — ABNORMAL HIGH (ref 0.44–1.00)
GFR calc Af Amer: >60 mL/min (ref >60)
GFR calc non Af Amer: >60 mL/min (ref >60)
Glucose, Bld: 116 mg/dL — ABNORMAL HIGH (ref 70–99)
Potassium: 3.4 mmol/L — ABNORMAL LOW (ref 3.5–5.1)
Sodium: 137 mmol/L (ref 135–145)

## 2018-12-22 LAB — TROPONIN I: Troponin I: <0.03 ng/mL (ref <0.03)

## 2018-12-22 MED ORDER — AMLODIPINE BESYLATE 5 MG PO TABS: 10.0000 mg | ORAL_TABLET | Freq: Every day | ORAL | 0 refills | Status: DC

## 2018-12-22 MED ORDER — AMLODIPINE BESYLATE 5 MG PO TABS: 10.0000 mg | ORAL_TABLET | Freq: Every day | ORAL | 1 refills | Status: DC

## 2018-12-22 NOTE — ED Notes (Signed)
PA Menshew in room at this time.

## 2018-12-22 NOTE — ED Notes (Signed)
Collected urine sample and completed POC

## 2018-12-22 NOTE — Discharge Instructions (Addendum)
Your exam, labs, and CXR are normal. Your symptoms may represent a mild situational anxiety. You have no signs of heart attack or heart damage. Your symptoms could also be due to undiagnosed sleep apnea. Follow-up with  your provider for referral for a sleep study. Take your BP med (10 mg daily) as directed. Follow-up with Cardiology for further evaluation, including a Holter monitor. Return to the ED as needed.

## 2018-12-22 NOTE — ED Provider Notes (Signed)
[facility] Emergency Department Provider Note ____________________________________________  Time seen: 1716  I have reviewed the triage vital signs and the nursing notes.  HISTORY  Chief Complaint  Chest Pain  HPI Tina Wall is a 38 y.o. female presents herself to the ED for evaluation of a 4-day complaint of chest pain.  Patient describes constant chest pain to the left side of her sternal chest with radiation into the left arm.  She recalls the pain usually wakes her from her sleep overnight. She endorses fast breathing and heart racing. She is unable to return to sleep. She is also reporting some shortness of breath that is worsened by exertion.  Patient denies any fevers, chills, cough, sweats, diaphoresis, syncope, nausea, or vomiting.  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  Patient was tested on 4/20, for COVID-19 and that test was negative. She states that she reported to the ED due to concern over a possible heart attack. 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 She denies any outright concern, as she has ample PPE at work. She has been poorly compliant with her BP med.   Past Medical History:  Diagnosis Date  . Asthma   . Bronchitis   . Diabetes mellitus without complication ([place])   . Gestational diabetes   . GI bleeding   . Headache   . Heart murmur   . HSV-2 (herpes simplex virus 2) infection   . Hx of chlamydia infection   . Hx of trichomoniasis   . Hypertension   . Ovarian cyst   . Syphilis     Patient Active Problem List   Diagnosis Date Noted  . Encounter for insertion of mirena IUD 02/20/2017  . Bronchitis with bronchospasm 09/12/2016  . History of gestational diabetes 05/27/2016  . Previous cesarean section 05/23/2016  . Fibroid 05/23/2016  . Asthma 04/12/2016  . Borderline diabetes 04/02/2015  . Essential hypertension 04/02/2015  . 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 CESAREAN SECTION     C/S x 2  . CESAREAN SECTION N/A 12/19/2016   Procedure: REPEAT CESAREAN SECTION;  Surgeon: Florian Buff, MD;  Location: [place];  Service: Obstetrics;  Laterality: N/A;  . FOOT SURGERY    . TONSILLECTOMY    . WISDOM TOOTH EXTRACTION      Prior to Admission medications   Medication Sig Start Date End Date Taking? Authorizing Provider  amLODipine (NORVASC) 5 MG tablet Take 5 mg by mouth daily.   Yes [provider]  albuterol (PROAIR HFA) 108 (90 Base) MCG/ACT inhaler Inhale 2 puffs into the lungs every 4 (four) hours as needed for wheezing or shortness of breath. Reported on 10/08/2015 Patient not taking: Reported on 02/20/2017 09/20/16   Eloise Levels, MD  amLODipine (NORVASC) 5 MG tablet Take 2 tablets (10 mg total) by mouth daily. 12/22/18 02/20/19  Nykayla Marcelli, Dannielle Karvonen, PA-C  fluticasone (FLONASE) 50 MCG/ACT nasal spray Place 2 sprays into both nostrils daily. 12/22/17   Julianne Rice, MD  ibuprofen (ADVIL,MOTRIN) 600 MG tablet Take 1 tablet (600 mg total) by mouth every 6 (six) hours as needed for headache or moderate pain. 12/22/17   Julianne Rice, MD  ipratropium-albuterol (DUONEB) 0.5-2.5 (3) MG/3ML SOLN Take 3 mLs by nebulization every 4 (four) hours. Patient not taking: Reported on 02/20/2017 09/20/16   Truett Mainland, DO  methocarbamol (  ROBAXIN) 500 MG tablet Take 2 tablets (1,000 mg total) by mouth every 8 (eight) hours as needed for muscle spasms. 12/22/17   Julianne Rice, MD    Allergies Ace inhibitors and Latex  Family History  Problem Relation Age of Onset  . Hypertension Mother   . Diabetes Mother   . Kidney disease Mother   . Cancer Father        trachea   . Alzheimer's disease Paternal Grandmother     Social History Social History   Tobacco Use  . Smoking status: Never Smoker  . Smokeless tobacco: Never Used  Substance Use Topics  . Alcohol use: Yes    Alcohol/week:  0.0 standard drinks    Comment: occasionally  . Drug use: No    Review of Systems  Constitutional: Negative for fever. Eyes: Negative for visual changes. ENT: Negative for sore throat. Cardiovascular: Positive for chest pain. Respiratory: Positive for shortness of breath. Gastrointestinal: Negative for abdominal pain, vomiting and diarrhea. Genitourinary: Negative for dysuria. Musculoskeletal: Negative for back pain. Skin: Negative for rash. Neurological: Negative for headaches, focal weakness or numbness. ____________________________________________  PHYSICAL EXAM:  VITAL SIGNS: ED Triage Vitals  Enc Vitals Group     BP 12/22/18 1631 (!) 171/101     Pulse Rate 12/22/18 1631 90     Resp 12/22/18 1631 18     Temp 12/22/18 1631 98.3 F (36.8 C)     Temp Source 12/22/18 1631 Oral     SpO2 12/22/18 1631 99 %     Weight 12/22/18 1629 220 lb (99.8 kg)     Height 12/22/18 1629 5' (1.524 m)     Head Circumference --      Peak Flow --      Pain Score 12/22/18 1629 6     Pain Loc --      Pain Edu? --      Excl. in [place]? --     Constitutional: Alert and oriented. Well appearing and in no distress. Head: Normocephalic and atraumatic. Eyes: Conjunctivae are normal. Normal extraocular movements Ears: Canals clear. TMs intact bilaterally. Neck: Supple. No thyromegaly. Cardiovascular: Normal rate, regular rhythm. Normal distal pulses. Respiratory: Normal respiratory effort. No wheezes/rales/rhonchi. Gastrointestinal: Soft and nontender. No distention. Musculoskeletal: Nontender with normal range of motion in all extremities.  Neurologic:  Normal gait without ataxia. Normal speech and language. No gross focal neurologic deficits are appreciated. Skin:  Skin is warm, dry and intact. No rash noted. Psychiatric: Mood is anxious and affect are normal. Patient exhibits appropriate insight and judgment. ____________________________________________   LABS (pertinent  positives/negatives) Labs Reviewed  CBC WITH DIFFERENTIAL/PLATELET - Abnormal; Notable for the following components:      Result Value   Neutro Abs 1.6 (*)    All other components within normal limits  BASIC METABOLIC PANEL - Abnormal; Notable for the following components:   Potassium 3.4 (*)    Glucose, Bld 116 (*)    Creatinine, Ser 1.02 (*)    All other components within normal limits  TROPONIN I  POCT PREGNANCY, URINE  ____________________________________________  EKG  NSR 94 bpm Mild left atrial enlargement Normal intervals No STEMI ____________________________________________   RADIOLOGY  CXR Negative ____________________________________________  PROCEDURES  Procedures ____________________________________________  INITIAL IMPRESSION / ASSESSMENT AND PLAN / ED COURSE  Tina Wall was evaluated in Emergency Department on 12/22/2018 for the symptoms described in the history of present illness. She was evaluated in the context of the global COVID-19 pandemic, which necessitated consideration  that the patient might be at risk for infection with the SARS-CoV-2 virus that causes COVID-19. Institutional protocols and algorithms that pertain to the evaluation of patients at risk for COVID-19 are in a state of rapid change based on information released by regulatory bodies including the CDC and federal and state organizations. These policies and algorithms were followed during the patient's care in the ED.  Patient with a history of HTN, borderline diabetes, and angina presents with complaints of chest pain, SOB, and anxiety. Her exam, labs, and CXR are reasurring. Her primary concern was for a cardiac injury. Her exam likely represents some situational anxiety, and chest pain due to probable undiagnosed OSA. There is no indication of ACS, AMI, CAP; she is not septic or in acute respiratory distress. 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 She has declined a COVID-19 test at this time. She will follow-up with her PCP for referral for a sleep study. We have increased her amlodipine to 10 mg QD, and she is referred to cardiology (again) for further evaluation. She  Understands her return discharge instructions and precautions.  ____________________________________________  FINAL CLINICAL IMPRESSION(S) / ED DIAGNOSES  Final diagnoses:  Essential hypertension  Anxiety  Nonspecific chest pain      Carmie End, Dannielle Karvonen, PA-C 12/22/18 Merlene Morse, [place], MD 12/22/18 2237

## 2018-12-22 NOTE — ED Triage Notes (Signed)
Pt to ED via POV c/o chest pain x 4 days. Pt states that the pain is constant. Pain is located in the left side of her chest and radiates into her arm some. Pt states that she is also having shortness of breath, worse with exertion. 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 Pt is in NAD.

## 2018-12-24 ENCOUNTER — Telehealth: Payer: Self-pay

## 2018-12-24 NOTE — Telephone Encounter (Signed)
Noted, will evaluate. 

## 2018-12-24 NOTE — Telephone Encounter (Signed)
[facility] Patient Name: Tina Wall Gender: Female DOB: July 12, 1981 Age: 38 Y 1 M 27 D Return Phone Number: [phone] (Primary) Address: City/State/Zip: [place] [address] Client [facility] Night - Client Client Site [facility] Physician Alma Friendly - NP Contact Type Call Who Is Calling Patient / Member / Family / Caregiver Call Type Triage / Clinical Relationship To Patient Self Return Phone Number [phone] (Primary) Chief Complaint CHEST PAIN (>=21 years) - pain, pressure, heaviness or tightness Reason for Call Symptomatic / Request for [place] states for a few days has been having a high heart rate, pain in her chest, arms and back, have questions? Translation No Nurse Assessment Nurse: Richardson Landry, RN, Aldona Bar Date/Time (Eastern Time): 12/22/2018 3:56:46 PM Confirm and document reason for call. If symptomatic, describe symptoms. ---Caller states she sometimes has difficulty breathing with it. Pt went walking earlier today, breathing seemed different. Denies cold. has cough in am, sx began 4 days ago. Has the patient had close contact with a person known or suspected to have the novel coronavirus illness OR traveled / lives in area with major community spread (including international travel) in the last 14 days from the onset of symptoms? * If Asymptomatic, screen for exposure and travel within the last 14 days. ---Yes Does the patient have any new or worsening symptoms? ---Yes Will a triage be completed? ---Yes Related visit to physician within the last 2 weeks? ---No Does the PT have any chronic conditions? (i.e. diabetes, asthma, this includes High risk factors for pregnancy, etc.) ---Yes List chronic conditions. ---asthma, prediabetic Is the patient pregnant or possibly  pregnant? (Ask all females between the ages of 37-55) ---No Is this a behavioral health or substance abuse call? ---No Guidelines Guideline Title Affirmed Question Affirmed Notes Nurse Date/Time (Eastern Time) Coronavirus (COVID-19) - Diagnosed or Suspected Difficult to awaken or acting confused (e.g., Richardson Landry, RN, Samantha 12/22/2018 3:58:07 PM PLEASE NOTE: All timestamps contained within this report are represented as [place] Standard Time. CONFIDENTIALTY NOTICE: This fax transmission is intended only for the addressee. It contains information that is legally privileged, confidential or otherwise protected from use or disclosure. If you are not the intended recipient, you are strictly prohibited from reviewing, disclosing, copying using or disseminating any of this information or taking any action in reliance on or regarding this information. If you have received this fax in error, please notify us immediately by telephone so that we can arrange for its return to [place]. Phone: [phone], Toll-Free: [phone], Fax: [phone] Page: 2 of 2 Call Id: [id number] Guidelines Guideline Title Affirmed Question Affirmed Notes Nurse Date/Time Eilene Ghazi Time) disoriented, slurred speech) Disp. Time Eilene Ghazi Time) Disposition Final User 12/22/2018 3:55:39 PM Send to Urgent Tawanna Cooler 12/22/2018 4:03:09 PM 911 Outcome Documentation Richardson Landry, RN, Aldona Bar Reason: Pt verbalizes understanding of RN recommendation to call 911, refuses, states she will have someone drive her to ED. RN advised caller to wear a mask and to tell the first staff person she sees of possible COVID, pt verbalizes understanding and agrees. 12/22/2018 4:01:23 PM Call EMS 911 Now Yes Richardson Landry, RN, Lucretia Field Disagree/Comply Disagree Caller Understands Yes PreDisposition Did not know what to do Care Advice Given Per Guideline CALL EMS 911 NOW: * Immediate medical attention is needed. You need to hang up and call  911 (or an ambulance). CARE ADVICE given per CORONAVIRUS (COVID-19) -  DIAGNOSED OR SUSPECTED (Adult) guideline. Referrals GO TO FACILITY UNDECIDED

## 2018-12-24 NOTE — Telephone Encounter (Signed)
Spoke with patient.  She is doing better but still has ongoing episodes of chest pain, radiating down in to arm.  Patient refused COVID testing at the hospital over the weekend.  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  Due to her high risk status, patient set up for ER f/u via doxy with her PCP tomorrow at 1140am.  She was given instructions for the doxy and that she will be contacted 10-19mins ahead of appointment time to sign on and get ready for the provider.

## 2018-12-25 ENCOUNTER — Encounter: Payer: Self-pay | Admitting: Primary Care

## 2018-12-25 ENCOUNTER — Ambulatory Visit (INDEPENDENT_AMBULATORY_CARE_PROVIDER_SITE_OTHER): Payer: Self-pay | Admitting: Primary Care

## 2018-12-25 DIAGNOSIS — R7303 Prediabetes: Secondary | ICD-10-CM

## 2018-12-25 DIAGNOSIS — I10 Essential (primary) hypertension: Secondary | ICD-10-CM

## 2018-12-25 DIAGNOSIS — J4541 Moderate persistent asthma with (acute) exacerbation: Secondary | ICD-10-CM

## 2018-12-25 NOTE — Patient Instructions (Signed)
Call our main line at [phone] to schedule your lab only appointment.  Make sure to notify me of the labs you will need for your upcoming surgery.   Start exercising. You should be getting 150 minutes of moderate intensity exercise weekly.  Continue to work on [employer].  Continue Amlodipine 10 mg daily, please send me blood pressure readings via My Chart in 2 weeks.   It was a pleasure to see you today! Allie Bossier, NP-C

## 2018-12-25 NOTE — Progress Notes (Signed)
Subjective:    Patient ID: Tina Wall, female    DOB: 10-01-1980, 38 y.o.   MRN: [id number]  HPI  Virtual Visit via Video Note  I connected with Tina Wall on 12/25/18 at 11:40 AM EDT by a video enabled telemedicine application and verified that I am speaking with the correct person using two identifiers.  Location: Patient: Home Provider: Office   I discussed the limitations of evaluation and management by telemedicine and the availability of in person appointments. The patient expressed understanding and agreed to proceed.  History of Present Illness:  Ms. Tina Wall is a 38 year old female who presents today for emergency department follow up and general follow up. She's not been seen in our clinic for over one year.   1) Emergency Department Follow Up: She presented to [facility] ED on 12/22/18 with a chief complaint of chest pain. She endorsed a four day history of chest pain to the left chest with radiation to left arm. Also with palpitations and tachypnea. 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 Also endorsed that she'd be non compliant to her antihypertensive medication.   During her stay in the ED she underwent testing including chest xray (negative for pneumonia), labs (negative including troponin), ECG (stable and unchanged). She was discharged home later that day with recommendations for cardiologist follow up and Holter Monitor evaluation. Her amlodipine was increased from 5 mg to 10 mg.  Since her ED visit she has not checked her BP. She is compliant to her Amlodipine 10 mg as prescribed. She denies chest pain/pressure today.   2) Prediabetes: No recent evaluation, glucose from recent ED visit of 116. No recent A1C on file. She will be undergoing a tummy tuck with liposuction in mid June 2020.  Diet currently consists of: She just started eating raw foods and drinking smoothies today. Diet previously consisted of:  Breakfast: Fast food Lunch:  Animator: Fast food Snacks: None Desserts: Daily  Beverages: Water  3) Asthma: Intermittent. Using albuterol inhaler PRN. No recent use. She denies wheezing, shortness of breath.    BP Readings from Last 3 Encounters:  12/22/18 (!) 148/104  12/22/17 (!) 154/104  02/20/17 140/80      Observations/Objective:  Alert and oriented. Appears well, not sickly. No distress. Speaking in complete sentences.   Assessment and Plan:  See problem based charting.  Follow Up Instructions:  Call our main line at [phone] to schedule your lab only appointment.  Make sure to notify me of the labs you will need for your upcoming surgery.   Start exercising. You should be getting 150 minutes of moderate intensity exercise weekly.  Continue to work on [employer].  Continue Amlodipine 10 mg daily, please send me blood pressure readings via My Chart in 2 weeks.   It was a pleasure to see you today! Allie Bossier, NP-C    I discussed the assessment and treatment plan with the patient. The patient was provided an opportunity to ask questions and all were answered. The patient agreed with the plan and demonstrated an understanding of the instructions.   The patient was advised to call back or seek an in-person evaluation if the symptoms worsen or if the condition fails to improve as anticipated.     Pleas Koch, NP    Review of Systems  Constitutional: Negative for fatigue.  Eyes: Negative for visual disturbance.  Respiratory: Negative for shortness of breath and wheezing.  Cardiovascular: Negative for chest pain.  Neurological: Negative for dizziness and numbness.       Past Medical History:  Diagnosis Date  . Asthma   . Bronchitis   . Diabetes mellitus without complication ([place])   . Gestational diabetes   . GI bleeding   . Headache   . Heart murmur   . HSV-2 (herpes simplex virus 2) infection   . Hx of chlamydia infection   . Hx of trichomoniasis    . Hypertension   . Ovarian cyst   . Syphilis      Social History   Socioeconomic History  . Marital status: Divorced    Spouse name: Not on file  . Number of children: Not on file  . Years of education: Not on file  . Highest education level: Not on file  Occupational History  . Not on file  Social Needs  . Financial resource strain: Not on file  . Food insecurity:    Worry: Not on file    Inability: Not on file  . Transportation needs:    Medical: Not on file    Non-medical: Not on file  Tobacco Use  . Smoking status: Never Smoker  . Smokeless tobacco: Never Used  Substance and Sexual Activity  . Alcohol use: Yes    Alcohol/week: 0.0 standard drinks    Comment: occasionally  . Drug use: No  . Sexual activity: Not Currently    Partners: Male    Birth control/protection: None  Lifestyle  . Physical activity:    Days per week: Not on file    Minutes per session: Not on file  . Stress: Not on file  Relationships  . Social connections:    Talks on phone: Not on file    Gets together: Not on file    Attends religious service: Not on file    Active member of club or organization: Not on file    Attends meetings of clubs or organizations: Not on file    Relationship status: Not on file  . Intimate partner violence:    Fear of current or ex partner: Not on file    Emotionally abused: Not on file    Physically abused: Not on file    Forced sexual activity: Not on file  Other Topics Concern  . Not on file  Social History Narrative  . Not on file    Past Surgical History:  Procedure Laterality Date  . CESAREAN SECTION     C/S x 2  . CESAREAN SECTION N/A 12/19/2016   Procedure: REPEAT CESAREAN SECTION;  Surgeon: Florian Buff, MD;  Location: [facility];  Service: Obstetrics;  Laterality: N/A;  . FOOT SURGERY    . TONSILLECTOMY    . WISDOM TOOTH EXTRACTION      Family History  Problem Relation Age of Onset  . Hypertension Mother   . Diabetes Mother    . Kidney disease Mother   . Cancer Father        trachea   . Alzheimer's disease Paternal Grandmother     Allergies  Allergen Reactions  . Ace Inhibitors Shortness Of Breath    Short of breath  . Latex Other (See Comments)    Causes irritation.    Current Outpatient Medications on File Prior to Visit  Medication Sig Dispense Refill  . amLODipine (NORVASC) 5 MG tablet Take 2 tablets (10 mg total) by mouth daily. 60 tablet 1  . fluticasone (FLONASE) 50 MCG/ACT nasal spray  Place 2 sprays into both nostrils daily. 16 g 0  . ibuprofen (ADVIL,MOTRIN) 600 MG tablet Take 1 tablet (600 mg total) by mouth every 6 (six) hours as needed for headache or moderate pain. 30 tablet 0  . ipratropium-albuterol (DUONEB) 0.5-2.5 (3) MG/3ML SOLN Take 3 mLs by nebulization every 4 (four) hours. 360 mL 0  . albuterol (PROAIR HFA) 108 (90 Base) MCG/ACT inhaler Inhale 2 puffs into the lungs every 4 (four) hours as needed for wheezing or shortness of breath. Reported on 10/08/2015 (Patient not taking: Reported on 12/25/2018) 1 Inhaler 3   No current facility-administered medications on file prior to visit.     There were no vitals taken for this visit.   Objective:   Physical Exam  Constitutional: She is oriented to person, place, and time. She appears well-nourished.  Respiratory: Effort normal. No respiratory distress.  Neurological: She is alert and oriented to person, place, and time.  Psychiatric: She has a normal mood and affect.           Assessment & Plan:

## 2018-12-25 NOTE — Assessment & Plan Note (Signed)
No recent A1C on file. She will schedule a lab only appointment for lab draw. She will provide [place] with the labs needed for her upcoming surgery.   Discussed the importance of a healthy diet and regular exercise in order for weight loss, and to reduce the risk of any potential medical problems.

## 2018-12-25 NOTE — Assessment & Plan Note (Signed)
Intermittent, uncomplicated. No wheezing/cough on exam. Continue PRN inhalers.

## 2018-12-25 NOTE — Assessment & Plan Note (Signed)
Above goal during recent ED visit, dose of Amlodipine was increased to 10 mg. She is compliant. Discussed for her to start checking BP once daily and send readings via my chart in 2 weeks.   BMP from recent hospital visit unremarkable.

## 2019-01-18 NOTE — Telephone Encounter (Signed)
Message left for patient to return my call.  

## 2019-01-22 ENCOUNTER — Other Ambulatory Visit: Payer: Self-pay

## 2019-01-22 ENCOUNTER — Ambulatory Visit: Payer: Self-pay | Admitting: Primary Care

## 2019-01-22 ENCOUNTER — Encounter: Payer: Self-pay | Admitting: Primary Care

## 2019-01-22 VITALS — BP 124/84 | HR 83 | Temp 98.5°F | Ht 60.0 in | Wt 217.8 lb

## 2019-01-22 DIAGNOSIS — J4541 Moderate persistent asthma with (acute) exacerbation: Secondary | ICD-10-CM

## 2019-01-22 DIAGNOSIS — Z01818 Encounter for other preprocedural examination: Secondary | ICD-10-CM | POA: Insufficient documentation

## 2019-01-22 DIAGNOSIS — R7303 Prediabetes: Secondary | ICD-10-CM

## 2019-01-22 DIAGNOSIS — I10 Essential (primary) hypertension: Secondary | ICD-10-CM

## 2019-01-22 NOTE — Assessment & Plan Note (Signed)
Will be undergoing liposuction on 02/06/19. Exam and HPI today unremarkable. BP at goal. ECG with NSR, no ST changes, t-wave inversion. Grossly unchanged from one month ago. Pending labs she should be cleared for surgery. Will need to fax labs and office notes once available.

## 2019-01-22 NOTE — Assessment & Plan Note (Signed)
Stable in the office today, continue amlodipine 5 mg. BMP pending.

## 2019-01-22 NOTE — Patient Instructions (Signed)
Stop by the lab prior to leaving today. I will notify you of your results once received.   [facility] in [place].   It was a pleasure to see you today!

## 2019-01-22 NOTE — Assessment & Plan Note (Signed)
No recent use of Duoneb treatment. Continue to monitor.

## 2019-01-22 NOTE — Assessment & Plan Note (Signed)
Labs pending. Discussed the importance of a healthy diet and regular exercise in order for weight loss, and to reduce the risk of any potential medical problems.

## 2019-01-22 NOTE — Progress Notes (Signed)
Subjective:    Patient ID: Tina Wall, female    DOB: 06/02/1981, 38 y.o.   MRN: [id number]  HPI  Ms. Tina Wall is a 38 year old female who presents today for surgical clearance.   She will be undergoing liposuction to the abdomen with transfer to the gluteus. She is scheduled for surgery on 02/06/19.   BP Readings from Last 3 Encounters:  01/22/19 124/84  12/22/18 (!) 148/104  12/22/17 (!) 154/104     Review of Systems  Constitutional: Negative for unexpected weight change.  HENT: Negative for rhinorrhea.   Respiratory: Negative for cough and shortness of breath.   Cardiovascular: Negative for chest pain.  Gastrointestinal: Negative for constipation and diarrhea.  Genitourinary: Negative for difficulty urinating and menstrual problem.       IUD in place  Musculoskeletal: Negative for arthralgias and myalgias.  Skin: Negative for rash.  Allergic/Immunologic: Negative for environmental allergies.  Neurological: Negative for dizziness and headaches.       Past Medical History:  Diagnosis Date  . Asthma   . Bronchitis   . Diabetes mellitus without complication ([place])   . Gestational diabetes   . GI bleeding   . Headache   . Heart murmur   . History of gestational diabetes 05/27/2016   Dx @ 9wks    A1C: 5.7  Glyburide 5mg  BID    2hr pp GTT: 105/170/144   A1C:___  . HSV-2 (herpes simplex virus 2) infection   . Hx of chlamydia infection   . Hx of trichomoniasis   . Hypertension   . Ovarian cyst   . Previous cesarean section 05/23/2016   Vag then c/s x 3  1st: FTP @ 6cm, 2nd & 3rd: repeat     . Syphilis      Social History   Socioeconomic History  . Marital status: Divorced    Spouse name: Not on file  . Number of children: Not on file  . Years of education: Not on file  . Highest education level: Not on file  Occupational History  . Not on file  Social Needs  . Financial resource strain: Not on file  . Food insecurity:    Worry: Not on file    Inability:  Not on file  . Transportation needs:    Medical: Not on file    Non-medical: Not on file  Tobacco Use  . Smoking status: Never Smoker  . Smokeless tobacco: Never Used  Substance and Sexual Activity  . Alcohol use: Yes    Alcohol/week: 0.0 standard drinks    Comment: occasionally  . Drug use: No  . Sexual activity: Not Currently    Partners: Male    Birth control/protection: None  Lifestyle  . Physical activity:    Days per week: Not on file    Minutes per session: Not on file  . Stress: Not on file  Relationships  . Social connections:    Talks on phone: Not on file    Gets together: Not on file    Attends religious service: Not on file    Active member of club or organization: Not on file    Attends meetings of clubs or organizations: Not on file    Relationship status: Not on file  . Intimate partner violence:    Fear of current or ex partner: Not on file    Emotionally abused: Not on file    Physically abused: Not on file    Forced sexual activity: Not  on file  Other Topics Concern  . Not on file  Social History Narrative  . Not on file    Past Surgical History:  Procedure Laterality Date  . CESAREAN SECTION     C/S x 2  . CESAREAN SECTION N/A 12/19/2016   Procedure: REPEAT CESAREAN SECTION;  Surgeon: Florian Buff, MD;  Location: [facility];  Service: Obstetrics;  Laterality: N/A;  . FOOT SURGERY    . TONSILLECTOMY    . WISDOM TOOTH EXTRACTION      Family History  Problem Relation Age of Onset  . Hypertension Mother   . Diabetes Mother   . Kidney disease Mother   . Cancer Father        trachea   . Alzheimer's disease Paternal Grandmother     Allergies  Allergen Reactions  . Ace Inhibitors Shortness Of Breath    Short of breath  . Latex Other (See Comments)    Causes irritation.    Current Outpatient Medications on File Prior to Visit  Medication Sig Dispense Refill  . albuterol (PROAIR HFA) 108 (90 Base) MCG/ACT inhaler Inhale 2 puffs  into the lungs every 4 (four) hours as needed for wheezing or shortness of breath. Reported on 10/08/2015 1 Inhaler 3  . amLODipine (NORVASC) 5 MG tablet Take 2 tablets (10 mg total) by mouth daily. 60 tablet 1  . fluticasone (FLONASE) 50 MCG/ACT nasal spray Place 2 sprays into both nostrils daily. 16 g 0  . ibuprofen (ADVIL,MOTRIN) 600 MG tablet Take 1 tablet (600 mg total) by mouth every 6 (six) hours as needed for headache or moderate pain. 30 tablet 0  . ipratropium-albuterol (DUONEB) 0.5-2.5 (3) MG/3ML SOLN Take 3 mLs by nebulization every 4 (four) hours. 360 mL 0   No current facility-administered medications on file prior to visit.     BP 124/84   Pulse 83   Temp 98.5 F (36.9 C) (Oral)   Ht 5' (1.524 m)   Wt 217 lb 12 oz (98.8 kg)   SpO2 99%   BMI 42.53 kg/m    Objective:   Physical Exam  Constitutional: She is oriented to person, place, and time. She appears well-nourished.  HENT:  Mouth/Throat: No oropharyngeal exudate.  Eyes: Pupils are equal, round, and reactive to light. EOM are normal.  Neck: Neck supple. No thyromegaly present.  Cardiovascular: Normal rate and regular rhythm.  Respiratory: Effort normal and breath sounds normal.  GI: Soft. Bowel sounds are normal. There is no abdominal tenderness.  Musculoskeletal: Normal range of motion.  Neurological: She is alert and oriented to person, place, and time.  Skin: Skin is warm and dry.  Psychiatric: She has a normal mood and affect.           Assessment & Plan:

## 2019-01-23 LAB — CBC
HCT: 39.6 % (ref 36.0–46.0)
Hemoglobin: 12.7 g/dL (ref 12.0–15.0)
MCHC: 32.1 g/dL (ref 30.0–36.0)
MCV: 93.9 fl (ref 78.0–100.0)
Platelets: 216 10*3/uL (ref 150.0–400.0)
RBC: 4.21 Mil/uL (ref 3.87–5.11)
RDW: 14.3 % (ref 11.5–15.5)
WBC: 4.3 10*3/uL (ref 4.0–10.5)

## 2019-01-23 LAB — BASIC METABOLIC PANEL
BUN: 12 mg/dL (ref 6–23)
CO2: 27 mEq/L (ref 19–32)
Calcium: 9 mg/dL (ref 8.4–10.5)
Chloride: 103 mEq/L (ref 96–112)
Creatinine, Ser: 0.9 mg/dL (ref 0.40–1.20)
GFR: 85.13 mL/min (ref >60.00)
Glucose, Bld: 83 mg/dL (ref 70–99)
Potassium: 3.9 mEq/L (ref 3.5–5.1)
Sodium: 136 mEq/L (ref 135–145)

## 2019-01-23 LAB — APTT: aPTT: 28.1 s (ref 23.4–32.7)

## 2019-01-23 LAB — PROTIME-INR
INR: 1 ratio (ref 0.8–1.0)
Prothrombin Time: 12.1 s (ref 9.6–13.1)

## 2019-01-25 ENCOUNTER — Ambulatory Visit: Payer: Self-pay | Admitting: Primary Care

## 2019-01-25 ENCOUNTER — Encounter: Payer: Self-pay | Admitting: *Deleted

## 2019-01-25 LAB — HCG, SERUM, QUALITATIVE: Preg, Serum: NEGATIVE

## 2019-01-25 LAB — GLYCOHEMOGLOBIN, TOTAL: Glycohemoglobin (GHb),Total: 7 % (ref 5.4–7.4)

## 2019-01-25 LAB — HIV ANTIBODY (ROUTINE TESTING W REFLEX): HIV 1&2 Ab, 4th Generation: NONREACTIVE

## 2019-01-28 ENCOUNTER — Telehealth: Payer: Self-pay | Admitting: Primary Care

## 2019-01-28 NOTE — Telephone Encounter (Signed)
Spoken to patient and inform her that we did a complete physical, this included the follow up from a few weeks ago. Patient verbalized understanding.

## 2019-01-28 NOTE — Telephone Encounter (Signed)
I called pt back and did confirm labs were sent to [facility]. Also, she wants a callback if there is anything additional needed that was not covered at 6/2 OV.    Copied from [place] [phone]. Topic: General - Call Back - No Documentation >> Jan 25, 2019  4:37 PM Erick Blinks wrote: Reason for CRM: Requesting call back  [phone]

## 2019-03-09 ENCOUNTER — Emergency Department: Payer: Self-pay

## 2019-03-09 ENCOUNTER — Emergency Department
Admission: EM | Admit: 2019-03-09 | Discharge: 2019-03-09 | Disposition: A | Discharge status: Home / Self Care | Payer: Self-pay | Attending: Emergency Medicine | Admitting: Emergency Medicine

## 2019-03-09 ENCOUNTER — Other Ambulatory Visit: Payer: Self-pay

## 2019-03-09 ENCOUNTER — Encounter: Payer: Self-pay | Admitting: Emergency Medicine

## 2019-03-09 DIAGNOSIS — I10 Essential (primary) hypertension: Secondary | ICD-10-CM | POA: Insufficient documentation

## 2019-03-09 DIAGNOSIS — E119 Type 2 diabetes mellitus without complications: Secondary | ICD-10-CM | POA: Insufficient documentation

## 2019-03-09 DIAGNOSIS — J069 Acute upper respiratory infection, unspecified: Secondary | ICD-10-CM | POA: Insufficient documentation

## 2019-03-09 DIAGNOSIS — L7634 Postprocedural seroma of skin and subcutaneous tissue following other procedure: Secondary | ICD-10-CM | POA: Insufficient documentation

## 2019-03-09 DIAGNOSIS — J45909 Unspecified asthma, uncomplicated: Secondary | ICD-10-CM | POA: Insufficient documentation

## 2019-03-09 DIAGNOSIS — N83201 Unspecified ovarian cyst, right side: Secondary | ICD-10-CM | POA: Insufficient documentation

## 2019-03-09 DIAGNOSIS — N83202 Unspecified ovarian cyst, left side: Secondary | ICD-10-CM | POA: Insufficient documentation

## 2019-03-09 DIAGNOSIS — R1031 Right lower quadrant pain: Secondary | ICD-10-CM | POA: Insufficient documentation

## 2019-03-09 DIAGNOSIS — S301XXA Contusion of abdominal wall, initial encounter: Secondary | ICD-10-CM

## 2019-03-09 LAB — COMPREHENSIVE METABOLIC PANEL
ALT: 17 U/L (ref 0–44)
AST: 22 U/L (ref 15–41)
Albumin: 3.8 g/dL (ref 3.5–5.0)
Alkaline Phosphatase: 39 U/L (ref 38–126)
Anion gap: 7 (ref 5–15)
BUN: 12 mg/dL (ref 6–20)
CO2: 26 mmol/L (ref 22–32)
Calcium: 8.8 mg/dL — ABNORMAL LOW (ref 8.9–10.3)
Chloride: 104 mmol/L (ref 98–111)
Creatinine, Ser: 0.81 mg/dL (ref 0.44–1.00)
GFR calc Af Amer: >60 mL/min (ref >60)
GFR calc non Af Amer: >60 mL/min (ref >60)
Glucose, Bld: 115 mg/dL — ABNORMAL HIGH (ref 70–99)
Potassium: 3.8 mmol/L (ref 3.5–5.1)
Sodium: 137 mmol/L (ref 135–145)
Total Bilirubin: 0.6 mg/dL (ref 0.3–1.2)
Total Protein: 7.3 g/dL (ref 6.5–8.1)

## 2019-03-09 LAB — URINALYSIS, COMPLETE (UACMP) WITH MICROSCOPIC
Bilirubin Urine: NEGATIVE
Glucose, UA: NEGATIVE mg/dL
Hgb urine dipstick: NEGATIVE
Ketones, ur: NEGATIVE mg/dL
Leukocytes,Ua: NEGATIVE
Nitrite: NEGATIVE
Protein, ur: NEGATIVE mg/dL
Specific Gravity, Urine: 1.002 — ABNORMAL LOW (ref 1.005–1.030)
pH: 7 (ref 5.0–8.0)

## 2019-03-09 LAB — CBC
HCT: 35.9 % — ABNORMAL LOW (ref 36.0–46.0)
Hemoglobin: 11.6 g/dL — ABNORMAL LOW (ref 12.0–15.0)
MCH: 30.1 pg (ref 26.0–34.0)
MCHC: 32.3 g/dL (ref 30.0–36.0)
MCV: 93 fL (ref 80.0–100.0)
Platelets: 216 10*3/uL (ref 150–400)
RBC: 3.86 MIL/uL — ABNORMAL LOW (ref 3.87–5.11)
RDW: 13.6 % (ref 11.5–15.5)
WBC: 4.4 10*3/uL (ref 4.0–10.5)
nRBC: 0 % (ref 0.0–0.2)

## 2019-03-09 LAB — LIPASE, BLOOD: Lipase: 35 U/L (ref 11–51)

## 2019-03-09 LAB — POCT PREGNANCY, URINE: Preg Test, Ur: NEGATIVE

## 2019-03-09 MED ORDER — SODIUM CHLORIDE 0.9% FLUSH: 3.0000 mL | Freq: Once | INTRAVENOUS | Status: DC

## 2019-03-09 MED ORDER — MELOXICAM 15 MG PO TABS: 15.0000 mg | ORAL_TABLET | Freq: Every day | ORAL | 0 refills | Status: DC

## 2019-03-09 MED ORDER — IOHEXOL 300 MG/ML  SOLN
100.0000 mL | Freq: Once | INTRAMUSCULAR | Status: AC | PRN
  Administered 2019-03-09: 100 mL via INTRAVENOUS

## 2019-03-09 MED ORDER — IOHEXOL 300 MG/ML  SOLN: 30.0000 mL | Freq: Once | INTRAMUSCULAR | Status: DC | PRN

## 2019-03-09 NOTE — ED Notes (Signed)
Signature pad not working. Hard copy printed and signed by patient.  

## 2019-03-09 NOTE — ED Provider Notes (Signed)
[facility] Emergency Department Provider Note  ____________________________________________  Time seen: Approximately 12:22 PM  I have reviewed the triage vital signs and the nursing notes.   HISTORY  Chief Complaint Abdominal Pain    HPI Tina Wall is a 38 y.o. female who presents the emergency department complaining of bilateral lower abdominal pain.  Patient reports that she woke up this morning with a sharp, stabbing sensation to bilateral lower quadrants.  Patient reports that while the pain has eased somewhat, she continues to have a pulling/tearing sensation in the lower abdomen.  Patient reports that approximately a month ago she did have liposuction.  She denied any kind of complications from surgery.  Patient reports that she has had no erythema, edema or drainage from her surgical site.  This is healed appropriately.  Patient is experienced no abdominal pain prior to today.  She denies any fevers or chills, nausea vomiting, diarrhea or constipation.  No dysuria, polyuria, hematuria.  No vaginal bleeding or discharge.  Patient states that pain abates somewhat when laying still but with any movement she has sharp pain.         Past Medical History:  Diagnosis Date  . Asthma   . Bronchitis   . Diabetes mellitus without complication ([place])   . Gestational diabetes   . GI bleeding   . Headache   . Heart murmur   . History of gestational diabetes 05/27/2016   Dx @ 9wks    A1C: 5.7  Glyburide 5mg  BID    2hr pp GTT: 105/170/144   A1C:___  . HSV-2 (herpes simplex virus 2) infection   . Hx of chlamydia infection   . Hx of trichomoniasis   . Hypertension   . Ovarian cyst   . Previous cesarean section 05/23/2016   Vag then c/s x 3  1st: FTP @ 6cm, 2nd & 3rd: repeat     . Syphilis     Patient Active Problem List   Diagnosis Date Noted  . Pre-op examination 01/22/2019  . Encounter for insertion of mirena IUD 02/20/2017  . Fibroid 05/23/2016  .  Asthma 04/12/2016  . Borderline diabetes 04/02/2015  . Essential hypertension 04/02/2015  . 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 CESAREAN SECTION     C/S x 2  . CESAREAN SECTION N/A 12/19/2016   Procedure: REPEAT CESAREAN SECTION;  Surgeon: Florian Buff, MD;  Location: [place];  Service: Obstetrics;  Laterality: N/A;  . FOOT SURGERY    . LIPOSUCTION    . TONSILLECTOMY    . WISDOM TOOTH EXTRACTION      Prior to Admission medications   Medication Sig Start Date End Date Taking? Authorizing Provider  albuterol (PROAIR HFA) 108 (90 Base) MCG/ACT inhaler Inhale 2 puffs into the lungs every 4 (four) hours as needed for wheezing or shortness of breath. Reported on 10/08/2015 09/20/16   Eloise Levels, MD  fluticasone [facility]) 50 MCG/ACT nasal spray Place 2 sprays into both nostrils daily. 12/22/17   Julianne Rice, MD  ibuprofen (ADVIL,MOTRIN) 600 MG tablet Take 1 tablet (600 mg total) by mouth every 6 (six) hours as needed for headache or moderate pain. 12/22/17   Julianne Rice, MD  ipratropium-albuterol (DUONEB) 0.5-2.5 (3) MG/3ML SOLN Take 3 mLs by nebulization every 4 (four) hours. 09/20/16   Truett Mainland, DO  meloxicam (MOBIC) 15 MG tablet Take 1 tablet (15 mg total) by  mouth daily. 03/09/19   Cuthriell, Charline Bills, PA-C    Allergies Ace inhibitors and Latex  Family History  Problem Relation Age of Onset  . Hypertension Mother   . Diabetes Mother   . Kidney disease Mother   . Cancer Father        trachea   . Alzheimer's disease Paternal Grandmother     Social History Social History   Tobacco Use  . Smoking status: Never Smoker  . Smokeless tobacco: Never Used  Substance Use Topics  . Alcohol use: Yes    Alcohol/week: 0.0 standard drinks    Comment: occasionally  . Drug use: No     Review of Systems  Constitutional: No fever/chills Eyes: No visual changes. No discharge ENT: No  upper respiratory complaints. Cardiovascular: no chest pain. Respiratory: no cough. No SOB. Gastrointestinal: Positive for bilateral lower abdominal pain.  No nausea, no vomiting.  No diarrhea.  No constipation. Genitourinary: Negative for dysuria. No hematuria Musculoskeletal: Negative for musculoskeletal pain. Skin: Negative for rash, abrasions, lacerations, ecchymosis. Neurological: Negative for headaches, focal weakness or numbness. 10-point ROS otherwise negative.  ____________________________________________   PHYSICAL EXAM:  VITAL SIGNS: ED Triage Vitals  Enc Vitals Group     BP 03/09/19 0842 131/81     Pulse Rate 03/09/19 0842 87     Resp 03/09/19 0842 18     Temp 03/09/19 0842 98.7 F (37.1 C)     Temp Source 03/09/19 0842 Oral     SpO2 03/09/19 0842 97 %     Weight 03/09/19 0843 215 lb (97.5 kg)     Height 03/09/19 0843 5\' 1"  (1.549 m)     Head Circumference --      Peak Flow --      Pain Score 03/09/19 0847 8     Pain Loc --      Pain Edu? --      Excl. in [place]? --      Constitutional: Alert and oriented. Well appearing and in no acute distress. Eyes: Conjunctivae are normal. PERRL. EOMI. Head: Atraumatic. Neck: No stridor.    Cardiovascular: Normal rate, regular rhythm. Normal S1 and S2.  Good peripheral circulation. Respiratory: Normal respiratory effort without tachypnea or retractions. Lungs CTAB. Good air entry to the bases with no decreased or absent breath sounds. Gastrointestinal: Well-healed visible scar in the lower abdomen from liposuction.  No erythema or edema.  No dehiscence of the scar.  Bowel sounds 4 quadrants.  Soft to palpation all quadrants.  Patient is very tender to palpation with mild guarding to the right lower quadrant.  No other tenderness to palpation.  No other guarding.  No rigidity. No palpable masses. No distention. No CVA tenderness. Musculoskeletal: Full range of motion to all extremities. No gross deformities  appreciated. Neurologic:  Normal speech and language. No gross focal neurologic deficits are appreciated.  Skin:  Skin is warm, dry and intact. No rash noted. Psychiatric: Mood and affect are normal. Speech and behavior are normal. Patient exhibits appropriate insight and judgement.   ____________________________________________   LABS (all labs ordered are listed, but only abnormal results are displayed)  Labs Reviewed  COMPREHENSIVE METABOLIC PANEL - Abnormal; Notable for the following components:      Result Value   Glucose, Bld 115 (*)    Calcium 8.8 (*)    All other components within normal limits  CBC - Abnormal; Notable for the following components:   RBC 3.86 (*)    Hemoglobin 11.6 (*)  HCT 35.9 (*)    All other components within normal limits  URINALYSIS, COMPLETE (UACMP) WITH MICROSCOPIC - Abnormal; Notable for the following components:   Color, Urine STRAW (*)    APPearance HAZY (*)    Specific Gravity, Urine 1.002 (*)    Bacteria, UA RARE (*)    All other components within normal limits  LIPASE, BLOOD  POC URINE PREG, ED  POCT PREGNANCY, URINE   ____________________________________________  EKG   ____________________________________________  RADIOLOGY I personally viewed and evaluated these images as part of my medical decision making, as well as reviewing the written report by the radiologist.  Ct Abdomen Pelvis W Contrast  Result Date: 03/09/2019 CLINICAL DATA:  38 year old female with lower abdominal pain. Three weeks status post liposuction procedure performed in [place]. EXAM: CT ABDOMEN AND PELVIS WITH CONTRAST TECHNIQUE: Multidetector CT imaging of the abdomen and pelvis was performed using the standard protocol following bolus administration of intravenous contrast. CONTRAST:  183mL OMNIPAQUE IOHEXOL 300 MG/ML  SOLN COMPARISON:  None. FINDINGS: Lower chest: The lung bases are clear. Visualized cardiac structures are within normal limits for size. No  pericardial effusion. Unremarkable visualized distal thoracic esophagus. Hepatobiliary: Normal hepatic contour and morphology. No discrete hepatic lesions. Normal appearance of the gallbladder. No intra or extrahepatic biliary ductal dilatation. Pancreas: Unremarkable. No pancreatic ductal dilatation or surrounding inflammatory changes. Spleen: Normal in size without focal abnormality. Adrenals/Urinary Tract: Adrenal glands are unremarkable. Kidneys are normal, without renal calculi, focal lesion, or hydronephrosis. Bladder is unremarkable. Stomach/Bowel: No evidence of obstruction or focal bowel wall thickening. Normal appendix in the right lower quadrant. The terminal ileum is unremarkable. Vascular/Lymphatic: No focal vascular abnormality, significant atherosclerotic calcification or suspicious lymphadenopathy. Reproductive: An IUD is present within the uterus. Large bilateral ovarian cysts measuring up to 4.7 cm on the right and 3.9 cm on the left. Other: Fairly extensive reticulation of the subcutaneous fat overlying the anterior abdominal wall extending into the flanks in the lower abdomen. There is a focal elongated ovoid fluid collection in the subcutaneous fat of the left lower quadrant abdominal wall measuring up to 6.2 x 1.6 cm. A smaller collection is present on the right measuring 4.0 x 1.1 cm. Finally, there is a small fluid collection in the region of the left inguinal ligament which measures 1.5 cm. No evidence of hernia. Musculoskeletal: No acute fracture or aggressive appearing lytic or blastic osseous lesion. IMPRESSION: 1. Fairly extensive subcutaneous reticulation within the subcutaneous fat of the anterior and bilateral lower quadrant abdominal wall consistent with the clinical history of recent liposuction. There are 2 small fluid collections, 1 in the lateral left lower quadrant abdominal wall measuring 6.2 x 1.6 cm, and the second in the lateral right lower quadrant abdominal wall measuring  4.0 x 1.1 cm. These may represent expected postprocedural seromas. In the appropriate clinical setting, abscess or hematoma would be difficult to exclude. 2. Small circumscribed fluid collection in the region of the left inguinal ligament may represent a small amount of fluid within a tiny inguinal hernia. 3. Bilateral ovarian cysts measuring up to 4.7 cm on the right and 3.9 cm on the left. 4. IUD in place. Electronically Signed   By: Jacqulynn Cadet M.D.   On: 03/09/2019 14:52    ____________________________________________    PROCEDURES  Procedure(s) performed:    Procedures    Medications  sodium chloride flush (NS) 0.9 % injection 3 mL (has no administration in time range)  iohexol (OMNIPAQUE) 300 MG/ML solution 30 mL (  has no administration in time range)  iohexol (OMNIPAQUE) 300 MG/ML solution 100 mL (100 mLs Intravenous Contrast Given 03/09/19 1421)     ____________________________________________   INITIAL IMPRESSION / ASSESSMENT AND PLAN / ED COURSE  Pertinent labs & imaging results that were available during my care of the patient were reviewed by me and considered in my medical decision making (see chart for details).  Review of the [place] CSRS was performed in accordance of the [place] prior to dispensing any controlled drugs.  Clinical Course as of Mar 08 1524  Sat Mar 09, 2019  1225 Patient presented to the emergency department with a complaint of bilateral lower abdominal pain starting today.  Patient did have liposuction 3 to 4 weeks ago with no complications.  Patient reports a sharp/pulling/tearing sensation and bilateral lower quadrants.  On exam, no evidence of dehiscence of surgical site.  No evidence of surgical site infection.  Patient is very tender to palpation in the right lower quadrant with no other specific tenderness to palpation.  At this time, labs are reassuring.  Vitals are also reassuring.  Given recent surgery, with findings of right lower quadrant  tenderness, patient will be evaluated with CT scan.  Differential at this time includes surgical site infection, scar tissue, abdominal wall muscle cramps, appendicitis, colitis, UTI, nephrolithiasis   [JC]    Clinical Course User Index [JC] Cuthriell, Charline Bills, PA-C          Patient's diagnosis is consistent with abdominal wall seroma, cyst of the bilateral ovaries.  Patient presented to the emergency department with lower abdominal pain.  Patient did have liposuction approximately a month ago.  No complications following surgery.  Patient presents today with bilateral lower abdominal pain.  Patient has been returning to activity including working out this past week.  Given tenderness in the right lower quadrant with palpation, patient was evaluated with CT.  Findings are consistent with known bilateral ovarian cyst, patient also has findings consistent with seroma to the abdominal wall.  This is correlated with patient's symptoms.  No evidence of infectious process.  At this time, patient is to call her original surgeon for any further recommendations.  Patient will be given referral to general surgery here.  Anti-inflammatories at this time for symptom relief.  Follow-up as described above.. Patient is given ED precautions to return to the ED for any worsening or new symptoms.     ____________________________________________  FINAL CLINICAL IMPRESSION(S) / ED DIAGNOSES  Final diagnoses:  Abdominal wall seroma, initial encounter  Cysts of both ovaries      NEW MEDICATIONS STARTED DURING THIS VISIT:  ED Discharge Orders         Ordered    meloxicam (MOBIC) 15 MG tablet  Daily     03/09/19 1525              This chart was dictated using voice recognition software/Dragon. Despite best efforts to proofread, errors can occur which can change the meaning. Any change was purely unintentional.    Darletta Moll, PA-C 03/09/19 1526    Carrie Mew, MD 03/10/19  [phone]

## 2019-03-09 NOTE — ED Notes (Signed)
Pt verbalized understanding of discharge instructions. NAD at this time. 

## 2019-03-09 NOTE — ED Triage Notes (Signed)
Pt to ED via POV c/o lower abdominal pain. Pt states that she is 3 weeks post op from liposuction that was done in [place]. Incision appear to be healed, no redness or drainage noted. Pt state that she woke up this morning with abdominal pain. Pt states that it is a "pulling" feeling in her lower abdomen. Pt denies any complications with surgery. Pt is tearful but otherwise in NAD.

## 2019-07-29 ENCOUNTER — Telehealth: Payer: Self-pay | Admitting: Primary Care

## 2019-07-29 NOTE — Telephone Encounter (Signed)
Pt called wanting chan to call her regarding Her lab work requested from dr [employer].  She is wanting to get labs done here.   She is aware that we don't do labs for another office unless it is within [place] group.  Pt stated she has had this done here in the past here

## 2019-07-29 NOTE — Telephone Encounter (Signed)
Please advise. I do not see [place] ever doing this for patient.

## 2019-07-29 NOTE — Telephone Encounter (Signed)
Noted  

## 2019-07-29 NOTE — Telephone Encounter (Addendum)
Patient stated that the doctor is a Psychiatric nurse. Patient will be undergoing a tummy tuck since she did not get done earlier in the year. Patient will send information through MyChart for Allie Bossier to review.

## 2019-07-29 NOTE — Telephone Encounter (Signed)
What type of doctor is this doctor? I don't see that we've done this before except for her surgeon in June 2020. Can we get some additional information?

## 2019-08-16 ENCOUNTER — Emergency Department (HOSPITAL_COMMUNITY): Payer: HRSA Program

## 2019-08-16 ENCOUNTER — Encounter (HOSPITAL_COMMUNITY): Payer: Self-pay | Admitting: Emergency Medicine

## 2019-08-16 ENCOUNTER — Other Ambulatory Visit: Payer: Self-pay

## 2019-08-16 ENCOUNTER — Emergency Department (HOSPITAL_COMMUNITY)
Admission: EM | Admit: 2019-08-16 | Discharge: 2019-08-16 | Disposition: A | Discharge status: Home / Self Care | Payer: HRSA Program | Attending: Emergency Medicine | Admitting: Emergency Medicine

## 2019-08-16 DIAGNOSIS — Z91040 Latex allergy status: Secondary | ICD-10-CM | POA: Insufficient documentation

## 2019-08-16 DIAGNOSIS — R05 Cough: Secondary | ICD-10-CM

## 2019-08-16 DIAGNOSIS — I10 Essential (primary) hypertension: Secondary | ICD-10-CM | POA: Diagnosis not present

## 2019-08-16 DIAGNOSIS — J45909 Unspecified asthma, uncomplicated: Secondary | ICD-10-CM | POA: Diagnosis not present

## 2019-08-16 DIAGNOSIS — Z79899 Other long term (current) drug therapy: Secondary | ICD-10-CM | POA: Diagnosis not present

## 2019-08-16 DIAGNOSIS — R059 Cough, unspecified: Secondary | ICD-10-CM

## 2019-08-16 DIAGNOSIS — U071 COVID-19: Secondary | ICD-10-CM | POA: Insufficient documentation

## 2019-08-16 MED ORDER — DOXYCYCLINE HYCLATE 100 MG PO CAPS: 100.0000 mg | ORAL_CAPSULE | Freq: Two times a day (BID) | ORAL | 0 refills | Status: DC

## 2019-08-16 NOTE — ED Provider Notes (Signed)
[facility] EMERGENCY DEPARTMENT Provider Note   CSN: [id number] Arrival date & time: 08/16/19  [id number]     History Chief Complaint  Patient presents with  . Cough    Tina Wall is a 38 y.o. female.  Patient complains of a cough mild sputum production for a few days.  Patient has a history of bronchitis  The history is provided by the patient. No language interpreter was used.  Cough Cough characteristics:  Productive Sputum characteristics:  Nondescript Severity:  Mild Onset quality:  Sudden Timing:  Constant Progression:  Waxing and waning Chronicity:  New Associated symptoms: no chest pain, no eye discharge, no headaches and no rash        Past Medical History:  Diagnosis Date  . Asthma   . Bronchitis   . Diabetes mellitus without complication ([place])   . Gestational diabetes   . GI bleeding   . Headache   . Heart murmur   . History of gestational diabetes 05/27/2016   Dx @ 9wks    A1C: 5.7  Glyburide 5mg  BID    2hr pp GTT: 105/170/144   A1C:___  . HSV-2 (herpes simplex virus 2) infection   . Hx of chlamydia infection   . Hx of trichomoniasis   . Hypertension   . Ovarian cyst   . Previous cesarean section 05/23/2016   Vag then c/s x 3  1st: FTP @ 6cm, 2nd & 3rd: repeat     . Syphilis     Patient Active Problem List   Diagnosis Date Noted  . Pre-op examination 01/22/2019  . Encounter for insertion of mirena IUD 02/20/2017  . Fibroid 05/23/2016  . Asthma 04/12/2016  . Borderline diabetes 04/02/2015  . Essential hypertension 04/02/2015  . 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 CESAREAN SECTION     C/S x 2  . CESAREAN SECTION N/A 12/19/2016   Procedure: REPEAT CESAREAN SECTION;  Surgeon: Florian Buff, MD;  Location: [place];  Service: Obstetrics;  Laterality: N/A;  . FOOT SURGERY    . LIPOSUCTION    . TONSILLECTOMY    . WISDOM TOOTH EXTRACTION       OB History    Gravida  4   Para  4   Term  4   Preterm      AB      Living  4     SAB      TAB      Ectopic      Multiple  0   Live Births  4           Family History  Problem Relation Age of Onset  . Hypertension Mother   . Diabetes Mother   . Kidney disease Mother   . Cancer Father        trachea   . Alzheimer's disease Paternal Grandmother     Social History   Tobacco Use  . Smoking status: Never Smoker  . Smokeless tobacco: Never Used  Substance Use Topics  . Alcohol use: Yes    Alcohol/week: 0.0 standard drinks    Comment: occasionally  . Drug use: No    Home Medications Prior to Admission medications   Medication Sig Start Date End Date Taking? Authorizing Provider  albuterol (PROAIR HFA) 108 (90 Base) MCG/ACT inhaler Inhale 2 puffs into the lungs every 4 (four) hours as needed for wheezing or shortness of breath. Reported  on 10/08/2015 09/20/16   Eloise Levels, MD  doxycycline (VIBRAMYCIN) 100 MG capsule Take 1 capsule (100 mg total) by mouth 2 (two) times daily. One po bid x 7 days 08/16/19   Milton Ferguson, MD  fluticasone [facility]) 50 MCG/ACT nasal spray Place 2 sprays into both nostrils daily. 12/22/17   Julianne Rice, MD  ibuprofen (ADVIL,MOTRIN) 600 MG tablet Take 1 tablet (600 mg total) by mouth every 6 (six) hours as needed for headache or moderate pain. 12/22/17   Julianne Rice, MD  ipratropium-albuterol (DUONEB) 0.5-2.5 (3) MG/3ML SOLN Take 3 mLs by nebulization every 4 (four) hours. 09/20/16   Truett Mainland, DO  meloxicam (MOBIC) 15 MG tablet Take 1 tablet (15 mg total) by mouth daily. 03/09/19   Cuthriell, Charline Bills, PA-C    Allergies    Ace inhibitors and Latex  Review of Systems   Review of Systems  Constitutional: Negative for appetite change and fatigue.  HENT: Negative for congestion, ear discharge and sinus pressure.   Eyes: Negative for discharge.  Respiratory: Positive for cough.   Cardiovascular: Negative for chest pain.    Gastrointestinal: Negative for abdominal pain and diarrhea.  Genitourinary: Negative for frequency and hematuria.  Musculoskeletal: Negative for back pain.  Skin: Negative for rash.  Neurological: Negative for seizures and headaches.  Psychiatric/Behavioral: Negative for hallucinations.    Physical Exam Updated Vital Signs BP 132/85 (BP Location: Right Arm)   Pulse 87   Temp 98.5 F (36.9 C) (Oral)   Resp 18   Ht 5\' 1"  (1.549 m)   Wt 86.6 kg   SpO2 99%   BMI 36.09 kg/m   Physical Exam Vitals and nursing note reviewed.  Constitutional:      Appearance: She is well-developed.  HENT:     Head: Normocephalic.     Nose: Nose normal.  Eyes:     General: No scleral icterus.    Conjunctiva/sclera: Conjunctivae normal.  Neck:     Thyroid: No thyromegaly.  Cardiovascular:     Rate and Rhythm: Normal rate and regular rhythm.     Heart sounds: No murmur. No friction rub. No gallop.   Pulmonary:     Breath sounds: No stridor. No wheezing or rales.  Chest:     Chest wall: No tenderness.  Abdominal:     General: There is no distension.     Tenderness: There is no abdominal tenderness. There is no rebound.  Musculoskeletal:        General: Normal range of motion.     Cervical back: Neck supple.  Lymphadenopathy:     Cervical: No cervical adenopathy.  Skin:    Findings: No erythema or rash.  Neurological:     Mental Status: She is oriented to person, place, and time.     Motor: No abnormal muscle tone.     Coordination: Coordination normal.  Psychiatric:        Behavior: Behavior normal.     ED Results / Procedures / Treatments   Labs (all labs ordered are listed, but only abnormal results are displayed) Labs Reviewed  NOVEL CORONAVIRUS, NAA (HOSP ORDER, SEND-OUT TO REF LAB; TAT 18-24 HRS)    EKG None  Radiology DG Chest 2 View  Result Date: 08/16/2019 CLINICAL DATA:  38 year old female with cough. EXAM: CHEST - 2 VIEW COMPARISON:  Chest radiograph dated  12/22/2018. FINDINGS: The heart size and mediastinal contours are within normal limits. Both lungs are clear. The visualized skeletal structures are unremarkable. IMPRESSION:  No active cardiopulmonary disease. Electronically Signed   By: Anner Crete M.D.   On: 08/16/2019 19:08    Procedures Procedures (including critical care time)  Medications Ordered in ED Medications - No data to display  ED Course  I have reviewed the triage vital signs and the nursing notes.  Pertinent labs & imaging results that were available during my care of the patient were reviewed by me and considered in my medical decision making (see chart for details).    MDM Rules/Calculators/A&P                      Patient with bronchitis.  She will get a Covid test done and start on doxycycline.  She will follow-up as needed and she will follow the Covid test Final Clinical Impression(s) / ED Diagnoses Final diagnoses:  Cough    Rx / DC Orders ED Discharge Orders         Ordered    doxycycline (VIBRAMYCIN) 100 MG capsule  2 times daily     08/16/19 2129           Milton Ferguson, MD 08/16/19 2133

## 2019-08-16 NOTE — ED Triage Notes (Signed)
Patient reports cough since before Thanksgiving, states she thinks the flu shot irritated her bronchitis. NAD noted in Triage. 97% on RA, speaking in complete sentences,

## 2019-08-16 NOTE — Discharge Instructions (Addendum)
Take Tylenol for fever and drink plenty of fluids and follow-up with your doctor if needed.  Your Covid test should be back in a few days

## 2019-08-18 LAB — NOVEL CORONAVIRUS, NAA (HOSP ORDER, SEND-OUT TO REF LAB; TAT 18-24 HRS): SARS-CoV-2, NAA: DETECTED — AB

## 2019-08-19 ENCOUNTER — Telehealth: Payer: Self-pay | Admitting: Nurse Practitioner

## 2019-08-19 NOTE — Telephone Encounter (Signed)
Called to Discuss with patient about Covid symptoms and the use of bamlanivimab, a monoclonal antibody infusion for those with mild to moderate Covid symptoms and at a high risk of hospitalization.     Pt is qualified for this infusion at the [facility] center due to co-morbid conditions and/or a member of an at-risk group.     Unable to reach pt  

## 2020-03-22 ENCOUNTER — Other Ambulatory Visit: Payer: Self-pay

## 2020-03-22 ENCOUNTER — Encounter: Payer: Self-pay | Admitting: Emergency Medicine

## 2020-03-22 ENCOUNTER — Emergency Department
Admission: EM | Admit: 2020-03-22 | Discharge: 2020-03-22 | Disposition: A | Discharge status: Home / Self Care | Payer: Self-pay | Attending: Emergency Medicine | Admitting: Emergency Medicine

## 2020-03-22 DIAGNOSIS — E119 Type 2 diabetes mellitus without complications: Secondary | ICD-10-CM | POA: Insufficient documentation

## 2020-03-22 DIAGNOSIS — G8918 Other acute postprocedural pain: Secondary | ICD-10-CM | POA: Insufficient documentation

## 2020-03-22 DIAGNOSIS — T8131XA Disruption of external operation (surgical) wound, not elsewhere classified, initial encounter: Secondary | ICD-10-CM | POA: Insufficient documentation

## 2020-03-22 DIAGNOSIS — Z79899 Other long term (current) drug therapy: Secondary | ICD-10-CM | POA: Insufficient documentation

## 2020-03-22 DIAGNOSIS — T8130XA Disruption of wound, unspecified, initial encounter: Secondary | ICD-10-CM

## 2020-03-22 DIAGNOSIS — Z91040 Latex allergy status: Secondary | ICD-10-CM | POA: Insufficient documentation

## 2020-03-22 DIAGNOSIS — Z48817 Encounter for surgical aftercare following surgery on the skin and subcutaneous tissue: Secondary | ICD-10-CM | POA: Insufficient documentation

## 2020-03-22 DIAGNOSIS — I10 Essential (primary) hypertension: Secondary | ICD-10-CM | POA: Insufficient documentation

## 2020-03-22 NOTE — ED Triage Notes (Signed)
Pt to ED via POV, pt states that she had a tummy tuck in [place] on 7/20, pt states that sometime in the last 24 hours her incision came open. Pt has open area on the right lower abdomen. Pt states that she is having a lot of drainage from the area.   Pt states that the area opened on the 28th as well and they put more stitches in it. Pt left [place] on Friday and returned home yesterday.

## 2020-03-22 NOTE — ED Notes (Addendum)
Pt had tummy tuck and BBL surgery the 20th of this month in [place]. Pt reports drainage from stitches.

## 2020-03-22 NOTE — Consult Note (Signed)
Reason for Consult: Wound dehiscence Referring Physician: Vladimir Crofts, MD (emergency medicine)  Tina Wall is an 38 y.o. female.  HPI: She recently underwent plastic surgery in [place], including a [place] butt lift and abdominoplasty.  She states that on Wednesday, while she was still in [place], she had approximately 1/2-1/3 of her abdominoplasty incision open.  She says that the surgeon resutured it.  She returned to the [place] on Friday.  She noticed drainage coming from the incision site today and present to the emergency department for evaluation.  A portion of the right lateral aspect of the abdominoplasty incision had reopened.  She denies any fevers or chills.  No nausea or vomiting.  No purulent drainage from the site.  General surgery has been consulted in this context for further evaluation of a postop complication related to surgical tourism.  Past Medical History:  Diagnosis Date  . Asthma   . Bronchitis   . Diabetes mellitus without complication ([place])   . Gestational diabetes   . GI bleeding   . Headache   . Heart murmur   . History of gestational diabetes 05/27/2016   Dx @ 9wks    A1C: 5.7  Glyburide 5mg  BID    2hr pp GTT: 105/170/144   A1C:___  . HSV-2 (herpes simplex virus 2) infection   . Hx of chlamydia infection   . Hx of trichomoniasis   . Hypertension   . Ovarian cyst   . Previous cesarean section 05/23/2016   Vag then c/s x 3  1st: FTP @ 6cm, 2nd & 3rd: repeat     . Syphilis     Past Surgical History:  Procedure Laterality Date  . CESAREAN SECTION     C/S x 2  . CESAREAN SECTION N/A 12/19/2016   Procedure: REPEAT CESAREAN SECTION;  Surgeon: Florian Buff, MD;  Location: [facility];  Service: Obstetrics;  Laterality: N/A;  . COSMETIC SURGERY    . FOOT SURGERY    . LIPOSUCTION    . TONSILLECTOMY    . WISDOM TOOTH EXTRACTION      Family History  Problem Relation Age of Onset  . Hypertension Mother   . Diabetes Mother   . Kidney disease  Mother   . Cancer Father        trachea   . Alzheimer's disease Paternal Grandmother     Social History:  reports that she has never smoked. She has never used smokeless tobacco. She reports current alcohol use. She reports that she does not use drugs.  Allergies:  Allergies  Allergen Reactions  . Ace Inhibitors Shortness Of Breath    Short of breath  . Latex Other (See Comments)    Causes irritation.    Medications: I have reviewed the patient's current medications.  No results found for this or any previous visit (from the past 48 hour(s)).  No results found.  Review of Systems  All other systems reviewed and are negative.  Blood pressure (!) 142/84, pulse 100, temperature 99.2 F (37.3 C), temperature source Oral, resp. rate 20, height 5' 0.5" (1.537 m), weight (!) 93.9 kg, SpO2 100 %. Physical Exam Constitutional:      General: She is not in acute distress.    Appearance: She is obese.  HENT:     Head: Normocephalic and atraumatic.     Nose:     Comments: Covered with a mask    Mouth/Throat:     Comments: Covered with a mask Eyes:  General: No scleral icterus.       Right eye: No discharge.        Left eye: No discharge.  Cardiovascular:     Rate and Rhythm: Regular rhythm. Tachycardia present.  Pulmonary:     Effort: Pulmonary effort is normal. No respiratory distress.  Abdominal:       Comments: There is a low transverse abdominoplasty incision.  The skin is closed primarily with subcuticular suture.  Approximately one third of the right lateral aspect of the incision is closed with interrupted Prolene.  Several of these have pulled through and there is an open area that exposes the subcutaneous fat.  There is no surrounding erythema or induration.  There is no purulent drainage.  There is tunneling on either side of the open skin.  Genitourinary:    Comments: Deferred Musculoskeletal:        General: No deformity or signs of injury.     Cervical back:  Normal range of motion.  Skin:    General: Skin is warm and dry.  Neurological:     General: No focal deficit present.     Mental Status: She is alert and oriented to person, place, and time.  Psychiatric:        Mood and Affect: Mood normal.        Behavior: Behavior normal.     Assessment/Plan: This is a 39 year old woman who underwent extensive plastic surgery in another country and now presents to emergency department with a complication related thereto.  She does report that the incision had previously opened and was resutured.  She has had serosanguineous drainage from an opening in her abdominoplasty incision.  There is no purulent discharge and the patient is afebrile.  I do not think she requires hospital admission.  She should perform dressing changes daily with saline moistened gauze.  I encouraged her to contact her primary care provider first thing in the morning and get a referral to plastic surgery or contact a plastic surgeon's office on her own so that her wounds may be managed by somebody here in the [place].  Fredirick Maudlin 03/22/2020, 11:01 AM

## 2020-03-22 NOTE — ED Notes (Signed)
ED Provider at bedside. 

## 2020-03-22 NOTE — Discharge Instructions (Signed)
You were seen in the ED because of your incision, in part, will call this wound dehiscence.  You need to see a Psychiatric nurse as soon as possible.  I attached the phone number for the plastic surgery group with  in [place].  Please call this number first thing in the morning on Monday to be seen as soon as possible.  Finish your course of antibiotics and please change the dressing on your abdomen once daily.  As we showed you, gently pack it with wet gauze soaked with sterile saline, put the pad over top of that and the tape that your skin.  This is called wet-to-dry dressing changes.  If you develop any fevers or worsening abdominal pain, please present to your closest emergency department for evaluation.

## 2020-03-22 NOTE — ED Provider Notes (Signed)
[facility] Emergency Department Provider Note ____________________________________________   First MD Initiated Contact with Patient 03/22/20 [phone]     (approximate)  I have reviewed the triage vital signs and the nursing notes.  HISTORY  Chief Complaint Post-op Problem   HPI Tina Wall is a 39 y.o. female with concerns for postoperative wound problem.  Chart review indicates COVID-19 positive test on 08/16/2019.  History of obesity.  Patient reports going to [place] and having a combination surgery of a tummy tuck and butt lift procedure performed on 03/10/2020. Patient reports returning from [place] 2 days ago. She reports she is currently on an antibiotic, she thinks Levaquin, and has 2 more days of this course. She reports finishing a course of Lovenox already.  Patient presents to the ED with wound dehiscence that occurred overnight last night. Patient reports the wound was intact when she went to bed last night, but waking this morning and noting a gaping area to her lower frontal abdominal incision with serosanguineous discharge. She reports putting a pad on it this morning, which she shows me with serosanguineous material. She reports an aching soreness 3/10 intensity that is constant, but denies any abdominal pain otherwise. She reports tolerating p.o. intake yesterday without nausea, vomiting or abdominal pain. Last BM was 2 days ago, and she has passed gas this morning.  Denies fevers, rash.   Past Medical History:  Diagnosis Date  . Asthma   . Bronchitis   . Diabetes mellitus without complication ([place])   . Gestational diabetes   . GI bleeding   . Headache   . Heart murmur   . History of gestational diabetes 05/27/2016   Dx @ 9wks    A1C: 5.7  Glyburide 5mg  BID    2hr pp GTT: 105/170/144   A1C:___  . HSV-2 (herpes simplex virus 2) infection   . Hx of chlamydia infection   . Hx of trichomoniasis   . Hypertension   . Ovarian cyst   .  Previous cesarean section 05/23/2016   Vag then c/s x 3  1st: FTP @ 6cm, 2nd & 3rd: repeat     . Syphilis     Patient Active Problem List   Diagnosis Date Noted  . Pre-op examination 01/22/2019  . Encounter for insertion of mirena IUD 02/20/2017  . Fibroid 05/23/2016  . Asthma 04/12/2016  . Borderline diabetes 04/02/2015  . Essential hypertension 04/02/2015  . 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 CESAREAN SECTION     C/S x 2  . CESAREAN SECTION N/A 12/19/2016   Procedure: REPEAT CESAREAN SECTION;  Surgeon: Florian Buff, MD;  Location: [facility];  Service: Obstetrics;  Laterality: N/A;  . COSMETIC SURGERY    . FOOT SURGERY    . LIPOSUCTION    . TONSILLECTOMY    . WISDOM TOOTH EXTRACTION      Prior to Admission medications   Medication Sig Start Date End Date Taking? Authorizing Provider  albuterol (PROAIR HFA) 108 (90 Base) MCG/ACT inhaler Inhale 2 puffs into the lungs every 4 (four) hours as needed for wheezing or shortness of breath. Reported on 10/08/2015 09/20/16   Eloise Levels, MD  doxycycline (VIBRAMYCIN) 100 MG capsule Take 1 capsule (100 mg total) by mouth 2 (two) times daily. One po bid x 7 days 08/16/19   Milton Ferguson, MD  fluticasone [facility]) 50 MCG/ACT nasal spray Place 2 sprays into  both nostrils daily. 12/22/17   Julianne Rice, MD  ibuprofen (ADVIL,MOTRIN) 600 MG tablet Take 1 tablet (600 mg total) by mouth every 6 (six) hours as needed for headache or moderate pain. 12/22/17   Julianne Rice, MD  ipratropium-albuterol (DUONEB) 0.5-2.5 (3) MG/3ML SOLN Take 3 mLs by nebulization every 4 (four) hours. 09/20/16   Truett Mainland, DO  meloxicam (MOBIC) 15 MG tablet Take 1 tablet (15 mg total) by mouth daily. 03/09/19   Cuthriell, Charline Bills, PA-C    Allergies Ace inhibitors and Latex  Family History  Problem Relation Age of Onset  . Hypertension Mother   . Diabetes Mother   .  Kidney disease Mother   . Cancer Father        trachea   . Alzheimer's disease Paternal Grandmother     Social History Social History   Tobacco Use  . Smoking status: Never Smoker  . Smokeless tobacco: Never Used  Vaping Use  . Vaping Use: Never used  Substance Use Topics  . Alcohol use: Yes    Alcohol/week: 0.0 standard drinks    Comment: occasionally  . Drug use: No    Review of Systems  Constitutional: No fever/chills Eyes: No visual changes. ENT: No sore throat. Cardiovascular: Denies chest pain. Respiratory: Denies shortness of breath. Gastrointestinal:   No nausea, no vomiting.  No diarrhea.  No constipation. Positive for abdominal pain and incisional pain. Genitourinary: Negative for dysuria. Musculoskeletal: Negative for back pain. Skin: Negative for rash. Neurological: Negative for headaches, focal weakness or numbness.   ____________________________________________   PHYSICAL EXAM:  VITAL SIGNS: Vitals:   03/22/20 0944  BP: (!) 142/84  Pulse: 100  Resp: 20  Temp: 99.2 F (37.3 C)  SpO2: 100%      Constitutional: Alert and oriented. Well appearing and in no acute distress. Obese. Sitting up in bed and conversational in full sentences. Eyes: Conjunctivae are normal. PERRL. EOMI. Head: Atraumatic. Nose: No congestion/rhinnorhea. Mouth/Throat: Mucous membranes are moist.  Oropharynx non-erythematous. Neck: No stridor. No cervical spine tenderness to palpation. Cardiovascular: Normal rate, regular rhythm. Grossly normal heart sounds.  Good peripheral circulation. Respiratory: Normal respiratory effort.  No retractions. Lungs CTAB. Gastrointestinal: Soft , nondistended No abdominal bruits. No CVA tenderness. Patient has an ABD pad and I remove that has splotches of serosanguineous discharge. Horizontal lower abdominal surgical incision extending across her lower abdomen, from pubic symphysis to pubic symphysis. The right central portion of this, about  8 cm in diameter is wound dehiscence with visible subcutaneous tissue and serosanguineous discharge. Musculoskeletal: No lower extremity tenderness nor edema.  No joint effusions. No signs of acute trauma. Neurologic:  Normal speech and language. No gross focal neurologic deficits are appreciated. No gait instability noted. Skin:  Skin is warm, dry and intact. No rash noted. Psychiatric: Mood and affect are normal. Speech and behavior are normal.  ____________________________________________   LABS (all labs ordered are listed, but only abnormal results are displayed)  Labs Reviewed - No data to display ____________________________________________  12 Lead EKG   ____________________________________________  RADIOLOGY  ED MD interpretation:    Official radiology report(s): No results found.  ____________________________________________   PROCEDURES and INTERVENTIONS  Procedure(s) performed (including Critical Care):  Procedures  Medications - No data to display  ____________________________________________   INITIAL IMPRESSION / ASSESSMENT AND PLAN / ED COURSE  39 year old woman 11 days postop from tummy tuck procedure performed in [place], presenting with wound dehiscence, amenable to outpatient management with plastic surgery follow-up.  Hemodynamically stable and without fever.  Exam with dehisced wound margin to right of midline, with visualized subcutaneous fat.  Consulted general surgeon on-call, who recommends rapid outpatient follow-up with plastic surgery and wet-to-dry dressing changes.  Patient was educated on dressing changes and provided follow-up information with plastic surgery.  Outpatient management was reviewed and return precautions for the ED were discussed.  Patient medically stable discharge home.  Clinical Course as of Mar 22 1116  Nancy Fetter Mar 22, 2020  1020 I asked secretary to page general surgery on-call   [DS]  1030 Spoke with general surgery  on-call, Dr. Celine Ahr, who will come see the patient.   [DS]  41 Dr. Celine Ahr has assessed the patient and recommends wet-to-dry dressing changes and follow up with plastic surgery.    [DS]    Clinical Course User Index [DS] Vladimir Crofts, MD     ____________________________________________   FINAL CLINICAL IMPRESSION(S) / ED DIAGNOSES  Final diagnoses:  Wound dehiscence  Postoperative pain     ED Discharge Orders    None       Chaz Ronning Tamala Julian   Note:  This document was prepared using Dragon voice recognition software and may include unintentional dictation errors.   Vladimir Crofts, MD 03/22/20 1118

## 2020-03-22 NOTE — ED Notes (Signed)
Open surgical wound packed w/ kerlix soaked in NS. Non adhesive bandage applied over then ABD pad secured w/ tape.

## 2020-03-24 ENCOUNTER — Ambulatory Visit (INDEPENDENT_AMBULATORY_CARE_PROVIDER_SITE_OTHER): Payer: Self-pay | Admitting: Plastic Surgery

## 2020-03-24 ENCOUNTER — Ambulatory Visit: Payer: Self-pay | Admitting: Primary Care

## 2020-03-24 ENCOUNTER — Encounter: Payer: Self-pay | Admitting: Primary Care

## 2020-03-24 ENCOUNTER — Encounter: Payer: Self-pay | Admitting: Plastic Surgery

## 2020-03-24 ENCOUNTER — Other Ambulatory Visit: Payer: Self-pay

## 2020-03-24 VITALS — BP 145/87 | HR 99 | Temp 98.5°F | Ht 61.0 in | Wt 210.0 lb

## 2020-03-24 VITALS — BP 130/82 | HR 111 | Temp 96.6°F | Ht 60.5 in | Wt 210.0 lb

## 2020-03-24 DIAGNOSIS — I10 Essential (primary) hypertension: Secondary | ICD-10-CM

## 2020-03-24 DIAGNOSIS — T8131XA Disruption of external operation (surgical) wound, not elsewhere classified, initial encounter: Secondary | ICD-10-CM

## 2020-03-24 LAB — CBC WITH DIFFERENTIAL/PLATELET
Basophils Absolute: 0 10*3/uL (ref 0.0–0.1)
Basophils Relative: 0.8 % (ref 0.0–3.0)
Eosinophils Absolute: 0.2 10*3/uL (ref 0.0–0.7)
Eosinophils Relative: 3 % (ref 0.0–5.0)
HCT: 27.7 % — ABNORMAL LOW (ref 36.0–46.0)
Hemoglobin: 9.1 g/dL — ABNORMAL LOW (ref 12.0–15.0)
Lymphocytes Relative: 28.6 % (ref 12.0–46.0)
Lymphs Abs: 1.5 10*3/uL (ref 0.7–4.0)
MCHC: 33 g/dL (ref 30.0–36.0)
MCV: 92.8 fl (ref 78.0–100.0)
Monocytes Absolute: 0.3 10*3/uL (ref 0.1–1.0)
Monocytes Relative: 5.9 % (ref 3.0–12.0)
Neutro Abs: 3.2 10*3/uL (ref 1.4–7.7)
Neutrophils Relative %: 61.7 % (ref 43.0–77.0)
Platelets: 395 10*3/uL (ref 150.0–400.0)
RBC: 2.98 Mil/uL — ABNORMAL LOW (ref 3.87–5.11)
RDW: 13.4 % (ref 11.5–15.5)
WBC: 5.2 10*3/uL (ref 4.0–10.5)

## 2020-03-24 LAB — BASIC METABOLIC PANEL
BUN: 9 mg/dL (ref 6–23)
CO2: 29 mEq/L (ref 19–32)
Calcium: 9 mg/dL (ref 8.4–10.5)
Chloride: 101 mEq/L (ref 96–112)
Creatinine, Ser: 0.93 mg/dL (ref 0.40–1.20)
GFR: 81.46 mL/min (ref >60.00)
Glucose, Bld: 94 mg/dL (ref 70–99)
Potassium: 3.7 mEq/L (ref 3.5–5.1)
Sodium: 137 mEq/L (ref 135–145)

## 2020-03-24 MED ORDER — AMLODIPINE BESYLATE 5 MG PO TABS: 5.0000 mg | ORAL_TABLET | Freq: Every day | ORAL | 0 refills | Status: DC

## 2020-03-24 NOTE — Patient Instructions (Addendum)
Stop by the front desk and speak with either Rosaria Ferries regarding your referral to plastic surgery.  Continue to pack and dress the site due to drainage.    Stop by the lab prior to leaving today. I will notify you of your results once received.   It was a pleasure to see you today!

## 2020-03-24 NOTE — Assessment & Plan Note (Signed)
Borderline in the office today, higher readings at home. Refills provided for amlodipine 5 mg.

## 2020-03-24 NOTE — Assessment & Plan Note (Signed)
Post operative complication from [place] in [place]. Obvious wound dehiscence today, does not appear infectious or toxic at this point. She is compliant to her oral antibiotic, but she will need some immediate attention soon.  Stat referral placed to plastic surgery for evaluation.  Checking labs today including CBC with diff and BMP.

## 2020-03-24 NOTE — Progress Notes (Signed)
Subjective:    Patient ID: Tina Wall, female    DOB: 05-16-1981, 39 y.o.   MRN: [id number]  HPI  This visit occurred during the SARS-CoV-2 public health emergency.  Safety protocols were in place, including screening questions prior to the visit, additional usage of staff PPE, and extensive cleaning of exam room while observing appropriate contact time as indicated for disinfecting solutions.   1) Post operative complication:  Underwent Tummy Tuck and butt lift procedures in [place] on 03/10/20, was placed on oral antibiotics and Lovenox SQ. She presented to [facility] ED on 03/22/20 with evidence of wound dehiscence to the right side that occurred the night prior. Increased drainage and separation that morning so she sought evaluation.  During her stay in the ED her exam was positive for dehisced wound margin to right of midline with visualized subcutaneous fat. She was hemodynamically stable without fever. General surgery was consulted via phone who recommended outpatient plastic surgery evaluation. She was discharged home later that day.  Since her ED visit she's continued to notice wound dehiscence with serosanguineous drainage. She has a friend who is a wound nurse who has shown her how to pack the wound site. She's been compliant to this. She had a virtual visit with her surgeon in [place] yesterday who recommended she continue to pack the site and monitor, but to return to [place] for further suturing if symptoms remained.   She denies strenuous activity, over exertion, fevers, chills. She's been mostly sedentary. She will complete her antibiotics tomorrow. Has noticed a burning pain to the right lower side.   2) Essential Hypertension:   Currently managed on amlodipine 5 mg, but ran out of this in July 2021 due to lack of updated follow up in our office. She's been checking her BP at home which is running high 130's/80's-90's.    Review of Systems  Constitutional: Negative for fever.   Respiratory: Negative for shortness of breath.   Cardiovascular: Negative for chest pain.  Skin: Positive for wound.       Wound dehiscence   Neurological: Negative for dizziness.       Past Medical History:  Diagnosis Date  . Asthma   . Bronchitis   . Diabetes mellitus without complication ([place])   . Gestational diabetes   . GI bleeding   . Headache   . Heart murmur   . History of gestational diabetes 05/27/2016   Dx @ 9wks    A1C: 5.7  Glyburide 5mg  BID    2hr pp GTT: 105/170/144   A1C:___  . HSV-2 (herpes simplex virus 2) infection   . Hx of chlamydia infection   . Hx of trichomoniasis   . Hypertension   . Ovarian cyst   . Previous cesarean section 05/23/2016   Vag then c/s x 3  1st: FTP @ 6cm, 2nd & 3rd: repeat     . Syphilis      Social History   Socioeconomic History  . Marital status: Divorced    Spouse name: Not on file  . Number of children: Not on file  . Years of education: Not on file  . Highest education level: Not on file  Occupational History  . Not on file  Tobacco Use  . Smoking status: Never Smoker  . Smokeless tobacco: Never Used  Vaping Use  . Vaping Use: Never used  Substance and Sexual Activity  . Alcohol use: Yes    Alcohol/week: 0.0 standard drinks    Comment: occasionally  .  Drug use: No  . Sexual activity: Not Currently    Partners: Male    Birth control/protection: None  Other Topics Concern  . Not on file  Social History Narrative  . Not on file   Social Determinants of Health   Financial Resource Strain:   . Difficulty of Paying Living Expenses:   Food Insecurity:   . Worried About Charity fundraiser in the Last Year:   . Arboriculturist in the Last Year:   Transportation Needs:   . Film/video editor (Medical):   Marland Kitchen Lack of Transportation (Non-Medical):   Physical Activity:   . Days of Exercise per Week:   . Minutes of Exercise per Session:   Stress:   . Feeling of Stress :   Social Connections:   . Frequency of  Communication with Friends and Family:   . Frequency of Social Gatherings with Friends and Family:   . Attends Religious Services:   . Active Member of Clubs or Organizations:   . Attends Archivist Meetings:   Marland Kitchen Marital Status:   Intimate Partner Violence:   . Fear of Current or Ex-Partner:   . Emotionally Abused:   Marland Kitchen Physically Abused:   . Sexually Abused:     Past Surgical History:  Procedure Laterality Date  . CESAREAN SECTION     C/S x 2  . CESAREAN SECTION N/A 12/19/2016   Procedure: REPEAT CESAREAN SECTION;  Surgeon: Florian Buff, MD;  Location: [facility];  Service: Obstetrics;  Laterality: N/A;  . COSMETIC SURGERY    . FOOT SURGERY    . LIPOSUCTION    . TONSILLECTOMY    . WISDOM TOOTH EXTRACTION      Family History  Problem Relation Age of Onset  . Hypertension Mother   . Diabetes Mother   . Kidney disease Mother   . Cancer Father        trachea   . Alzheimer's disease Paternal Grandmother     Allergies  Allergen Reactions  . Ace Inhibitors Shortness Of Breath    Short of breath  . Latex Other (See Comments)    Causes irritation.    Current Outpatient Medications on File Prior to Visit  Medication Sig Dispense Refill  . albuterol (PROAIR HFA) 108 (90 Base) MCG/ACT inhaler Inhale 2 puffs into the lungs every 4 (four) hours as needed for wheezing or shortness of breath. Reported on 10/08/2015 1 Inhaler 3  . fluticasone (FLONASE) 50 MCG/ACT nasal spray Place 2 sprays into both nostrils daily. 16 g 0  . ibuprofen (ADVIL,MOTRIN) 600 MG tablet Take 1 tablet (600 mg total) by mouth every 6 (six) hours as needed for headache or moderate pain. 30 tablet 0  . ipratropium-albuterol (DUONEB) 0.5-2.5 (3) MG/3ML SOLN Take 3 mLs by nebulization every 4 (four) hours. 360 mL 0  . meloxicam (MOBIC) 15 MG tablet Take 1 tablet (15 mg total) by mouth daily. 30 tablet 0   No current facility-administered medications on file prior to visit.    BP 130/82    Pulse (!) 111   Temp (!) 96.6 F (35.9 C) (Temporal)   Ht 5' 0.5" (1.537 m)   Wt 210 lb (95.3 kg)   SpO2 98%   BMI 40.34 kg/m    Objective:   Physical Exam Constitutional:      Appearance: She is not ill-appearing or toxic-appearing.  Cardiovascular:     Rate and Rhythm: Normal rate.  Pulmonary:  Effort: Pulmonary effort is normal.  Skin:    General: Skin is warm.     Comments: Open wound under right abdominal fold with packing. Open unpacked wound under left abdominal fold without packing. Bilateral sides with serosanguineous drainage.   Mild erythema and warmth.  Neurological:     Mental Status: She is alert.            Assessment & Plan:

## 2020-03-24 NOTE — Progress Notes (Signed)
Patient ID: Tina Wall, female    DOB: [birth year], 39 y.o.   MRN: [id number]   Chief Complaint  Patient presents with  . Advice Only  . Skin Problem    The patient is a 39 year old female here for evaluation of her abdomen wound.  The patient was seen in the emergency room 2 days ago with an abdominal wound.  The patient reports going to [place] and getting an abdominoplasty with liposuction and a [place] butt lift July 20.  She noticed in the past few days and opening of her incision and a lot of drainage.  The patient also had liposuction a year ago.  She had complications at that time and was seen in the emergency room.  She has a history of positive Covid test in December.  She has a history of obesity, diabetes, asthma, trichomoniasis, chlamydia, hypertension and syphilis.  In the emergency room she reported that she is passing gas.  She is tolerating food.  She reported passing gas when she was seen in the ER.  She has significant swelling in the pubic area and abdominal area.  I do not see any purulence or gross infection.  She has an opening on the left incision and the right incision.  It appears to be 2 x 5 x 2 cm on the right and 1 x 4 x 1 cm on the left.  It looks like there will be other areas to open as well.  She is not a smoker.  She has been packing it with saline gauze.   Review of Systems  Constitutional: Positive for activity change. Negative for appetite change.  Eyes: Negative.   Respiratory: Negative.  Negative for chest tightness and shortness of breath.   Cardiovascular: Positive for leg swelling.  Gastrointestinal: Positive for abdominal pain.  Endocrine: Negative.   Genitourinary: Negative.   Musculoskeletal: Negative.   Hematological: Negative.     Past Medical History:  Diagnosis Date  . Asthma   . Bronchitis   . Diabetes mellitus without complication ([place])   . Gestational diabetes   . GI bleeding   . Headache   . Heart murmur   . History of  gestational diabetes 05/27/2016   Dx @ 9wks    A1C: 5.7  Glyburide 5mg  BID    2hr pp GTT: 105/170/144   A1C:___  . HSV-2 (herpes simplex virus 2) infection   . Hx of chlamydia infection   . Hx of trichomoniasis   . Hypertension   . Ovarian cyst   . Previous cesarean section 05/23/2016   Vag then c/s x 3  1st: FTP @ 6cm, 2nd & 3rd: repeat     . Syphilis     Past Surgical History:  Procedure Laterality Date  . CESAREAN SECTION     C/S x 2  . CESAREAN SECTION N/A 12/19/2016   Procedure: REPEAT CESAREAN SECTION;  Surgeon: Florian Buff, MD;  Location: [facility];  Service: Obstetrics;  Laterality: N/A;  . COSMETIC SURGERY    . FOOT SURGERY    . LIPOSUCTION    . TONSILLECTOMY    . WISDOM TOOTH EXTRACTION        Current Outpatient Medications:  .  albuterol (PROAIR HFA) 108 (90 Base) MCG/ACT inhaler, Inhale 2 puffs into the lungs every 4 (four) hours as needed for wheezing or shortness of breath. Reported on 10/08/2015, Disp: 1 Inhaler, Rfl: 3 .  amLODipine (NORVASC) 5 MG tablet, Take 1 tablet (  5 mg total) by mouth daily. For blood pressure., Disp: 90 tablet, Rfl: 0 .  fluticasone (FLONASE) 50 MCG/ACT nasal spray, Place 2 sprays into both nostrils daily., Disp: 16 g, Rfl: 0 .  ibuprofen (ADVIL,MOTRIN) 600 MG tablet, Take 1 tablet (600 mg total) by mouth every 6 (six) hours as needed for headache or moderate pain., Disp: 30 tablet, Rfl: 0 .  ipratropium-albuterol (DUONEB) 0.5-2.5 (3) MG/3ML SOLN, Take 3 mLs by nebulization every 4 (four) hours., Disp: 360 mL, Rfl: 0 .  meloxicam (MOBIC) 15 MG tablet, Take 1 tablet (15 mg total) by mouth daily., Disp: 30 tablet, Rfl: 0   Objective:   Vitals:   03/24/20 1427  BP: (!) 145/87  Pulse: 99  Temp: 98.5 F (36.9 C)  SpO2: 100%    Physical Exam Vitals and nursing note reviewed.  Constitutional:      Appearance: Normal appearance.  HENT:     Head: Normocephalic and atraumatic.  Cardiovascular:     Rate and Rhythm: Normal rate.      Pulses: Normal pulses.  Pulmonary:     Effort: Pulmonary effort is normal.  Abdominal:     General: Abdomen is flat. There is distension.     Palpations: There is no mass.     Tenderness: There is abdominal tenderness. There is guarding.  Skin:    General: Skin is warm.     Capillary Refill: Capillary refill takes less than 2 seconds.  Neurological:     General: No focal deficit present.     Mental Status: She is alert and oriented to person, place, and time.  Psychiatric:        Mood and Affect: Mood normal.        Behavior: Behavior normal.     Assessment & Plan:  Postoperative wound dehiscence, initial encounter  We discussed the options.  She can continue to pack the area with gauze.  It will likely heal but may take a month or more.  She can also have revision with excision of the wounds and closure or at least an attempt at closure.  She would like to move in that direction.  We will get her the information for this.  Pictures were obtained of the patient and placed in the chart with the patient's or guardian's permission.   [place], DO

## 2020-04-01 ENCOUNTER — Telehealth: Payer: Self-pay | Admitting: Plastic Surgery

## 2020-04-01 NOTE — Telephone Encounter (Signed)
Called patient to discuss possible surgery dates for the requested procedure that was discussed with Dr. Marla Roe. The patient indicated that she does not want to pursue surgery at this time, that she has "been packing the wound at it seems a little better." I advised her that I would update Dr. Marla Roe of her decision, and advised that she should contact our office if she chooses to proceed with surgery.   The patient then asked if we could call in an antibiotic for her. I offered an appointment with one of our PA's so that we could see what was going on with the wound and make a determination on the best course of action. She declined because she "was just seen and does not know why she would have to come in." I told her that I would pass a message along to Dr. Marla Roe for review, and someone from our office would be in touch with her once we have a response. Patient expressed understanding and will await a call.

## 2020-05-01 IMAGING — DX DG CHEST 2V
2 series · 2 of 2 positions shown · non-contrast
Comparison: Chest radiograph dated 12/22/2018.

CLINICAL DATA: 38-year-old female with cough.

EXAM:
CHEST - 2 VIEW

[chest pa]
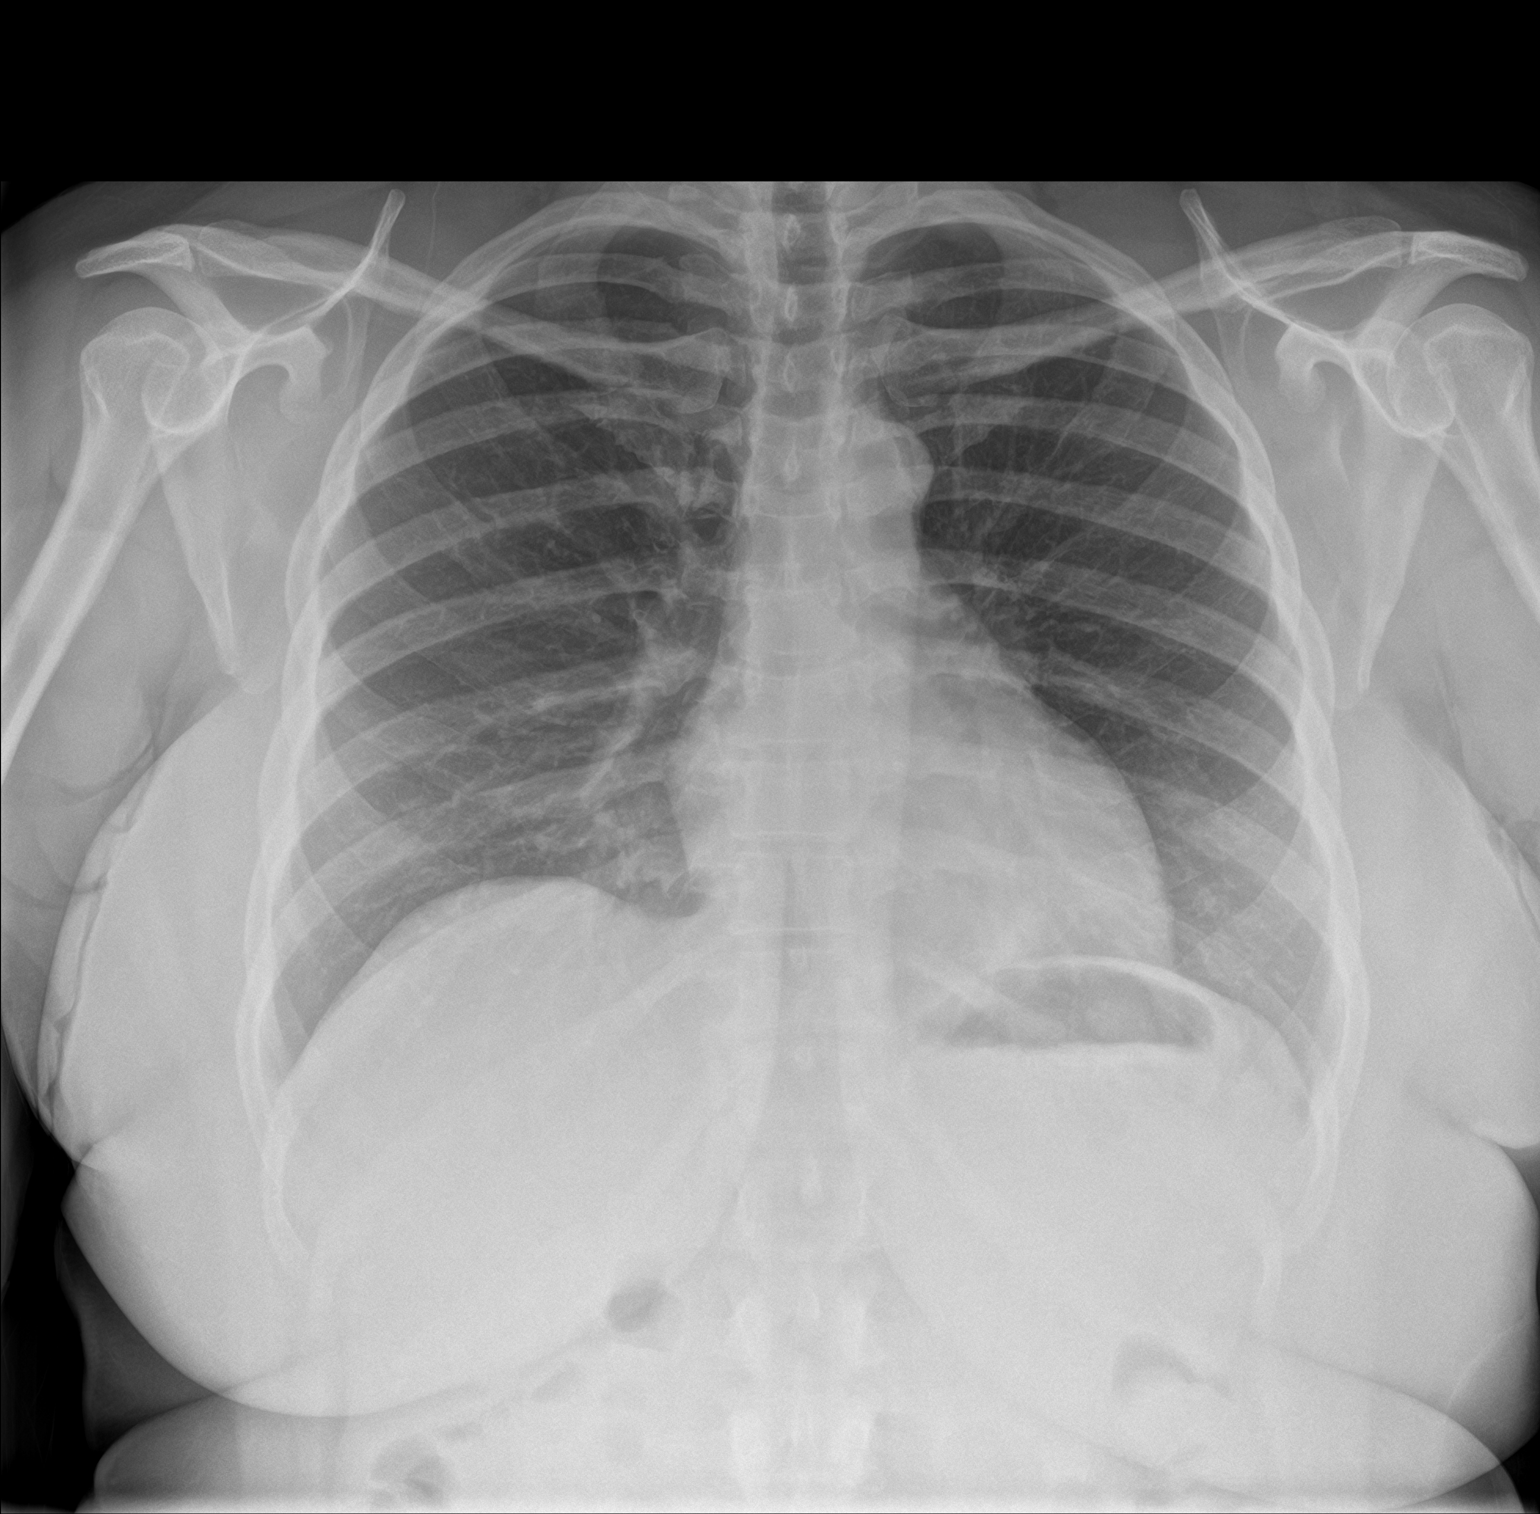

[chest lat]
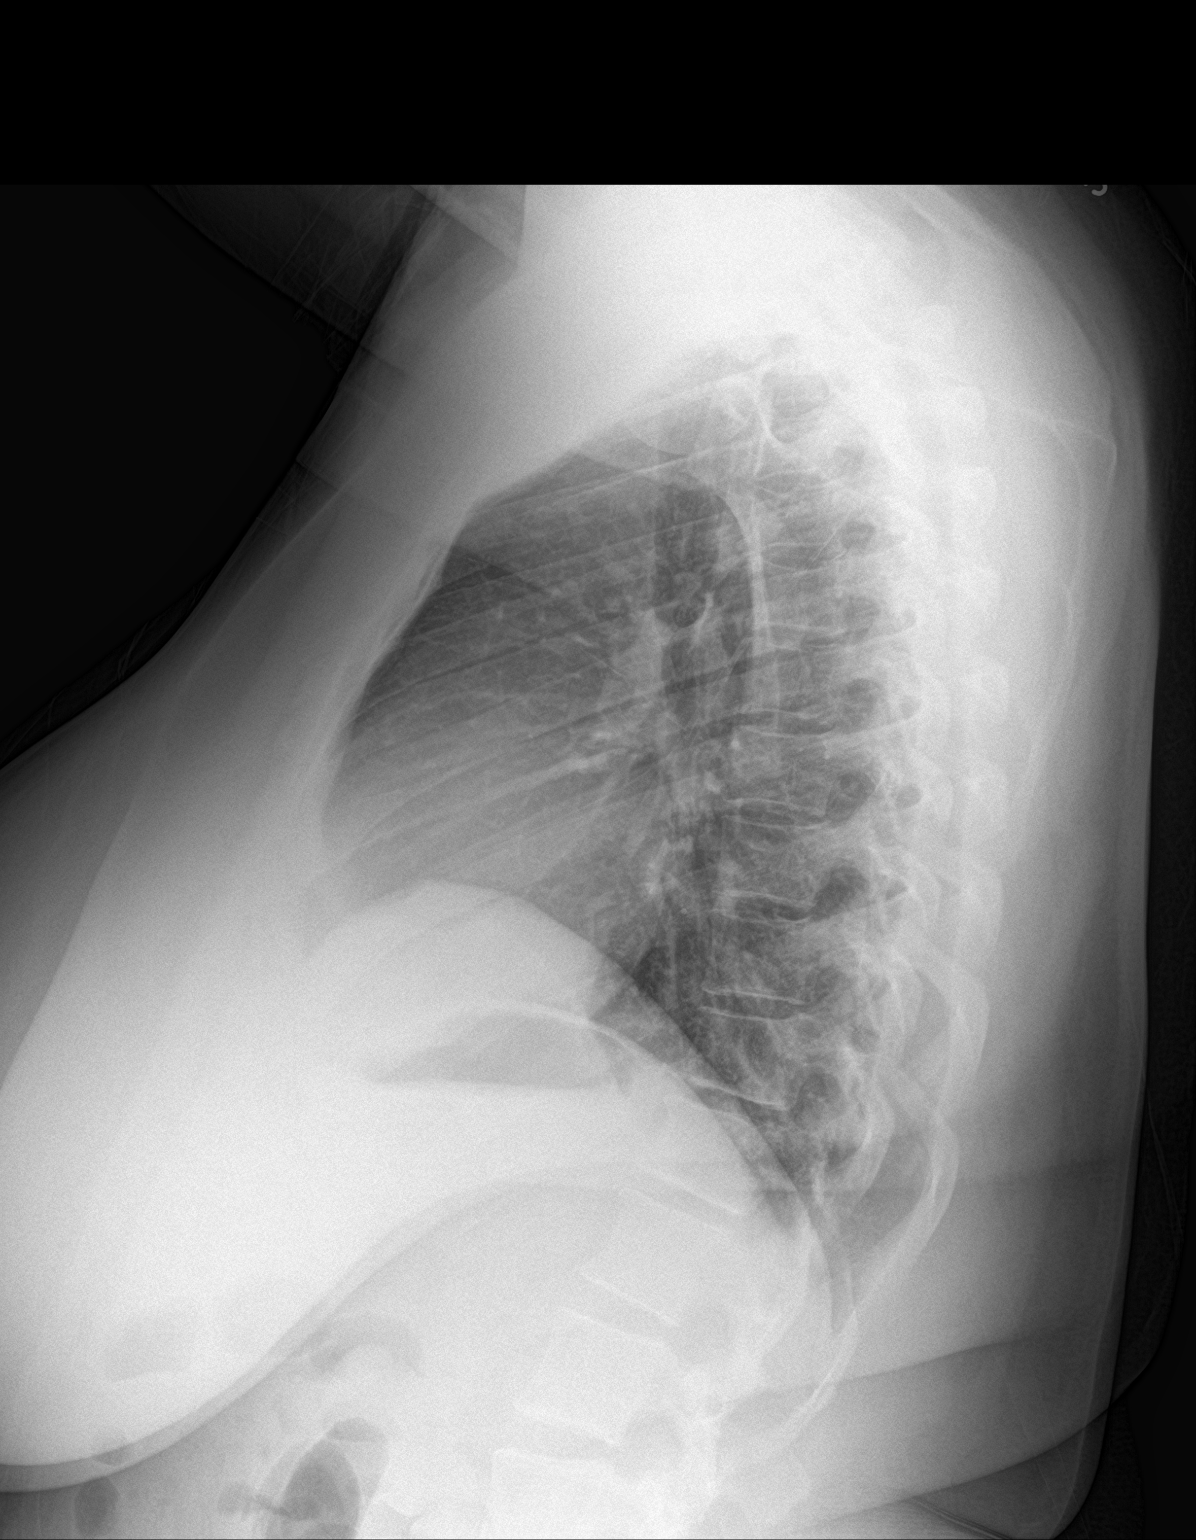

[2 of 2 positions shown; findings below may reference images not displayed]

FINDINGS: The heart size and mediastinal contours are within normal limits.
Both lungs are clear. The visualized skeletal structures are
unremarkable.
IMPRESSION: No active cardiopulmonary disease.

## 2020-06-29 LAB — HM COLONOSCOPY

## 2020-07-30 ENCOUNTER — Encounter: Payer: Self-pay | Admitting: Primary Care

## 2020-09-29 ENCOUNTER — Other Ambulatory Visit: Payer: Self-pay | Admitting: Primary Care

## 2020-09-29 DIAGNOSIS — I10 Essential (primary) hypertension: Secondary | ICD-10-CM

## 2020-12-15 ENCOUNTER — Encounter: Payer: Self-pay | Admitting: Obstetrics & Gynecology

## 2021-04-19 ENCOUNTER — Other Ambulatory Visit: Payer: Self-pay | Admitting: Physician Assistant

## 2021-04-19 DIAGNOSIS — Z975 Presence of (intrauterine) contraceptive device: Secondary | ICD-10-CM

## 2021-04-21 ENCOUNTER — Ambulatory Visit
Admission: RE | Admit: 2021-04-21 | Discharge: 2021-04-21 | Disposition: A | Discharge status: Home / Self Care | Payer: Medicaid Other | Source: Ambulatory Visit | Attending: Physician Assistant | Admitting: Physician Assistant

## 2021-04-21 DIAGNOSIS — Z975 Presence of (intrauterine) contraceptive device: Secondary | ICD-10-CM

## 2022-01-07 ENCOUNTER — Ambulatory Visit: Payer: Medicaid Other | Admitting: Primary Care

## 2022-01-07 ENCOUNTER — Encounter: Payer: Self-pay | Admitting: Primary Care

## 2022-01-07 DIAGNOSIS — R053 Chronic cough: Secondary | ICD-10-CM

## 2022-01-07 DIAGNOSIS — I10 Essential (primary) hypertension: Secondary | ICD-10-CM | POA: Diagnosis not present

## 2022-01-07 HISTORY — DX: Chronic cough: R05.3

## 2022-01-07 MED ORDER — AMLODIPINE BESYLATE 5 MG PO TABS: 5.0000 mg | ORAL_TABLET | Freq: Every day | ORAL | 0 refills | Status: DC

## 2022-01-07 MED ORDER — OMEPRAZOLE 20 MG PO CPDR: 20.0000 mg | DELAYED_RELEASE_CAPSULE | Freq: Two times a day (BID) | ORAL | 0 refills | Status: DC

## 2022-01-07 MED ORDER — FLUTICASONE PROPIONATE HFA 110 MCG/ACT IN AERO: 1.0000 | INHALATION_SPRAY | Freq: Two times a day (BID) | RESPIRATORY_TRACT | 0 refills | Status: DC

## 2022-01-07 NOTE — Patient Instructions (Signed)
Start fluticasone (Flovent) 110 mcg inhaler. Inhale 1 puff into the lungs twice daily. Rinse your mouth after each use.  Start omeprazole 20 mg for cough. Take 1 tablet every evening. Okay to increase to twice daily if needed.  Please update me in 2 week!  It was a pleasure to see you today!

## 2022-01-07 NOTE — Progress Notes (Addendum)
Subjective:    Patient ID: Tina Wall, female    DOB: 05-27-1981, 41 y.o.   MRN: [id number]  HPI  Tina Wall is a very pleasant 41 y.o. female with a history of hypertension, asthma, prediabetes who presents today to discuss chronic cough. She is also requesting a refill of her amlodipine.  Her cough began began two months ago, occurs constantly during the day, also at night.   She was evaluated at Urgent Care in April 2023 for a one month history of cough. She was provided with prednisone, Tessalon Perles, Amoxicillin, albuterol inhaler, cough syrup. She took that regimen with temporary improvement. The prednisone helped the most.  She presented to Urgent [facility] two weeks ago for ongoing cough. She was provided with Doxycyline prednisone, cough syrup. She never took prednisone.   Today she's slightly better, but continues to cough throughout the day and evening. Her cough does wake her from sleep, she will wake up choking.   Currently she's taking cough syrup and Tessalon Perles without much improvement. She's not used her albuterol inhaler as she doesn't notice wheezing during the day, she does notice wheezing at night.   She denies chest tightness but has had mid sternal chest pressure. She has noticed esophageal burning.    Review of Systems  Constitutional:  Negative for chills, fatigue and fever.  HENT:  Negative for congestion and postnasal drip.   Respiratory:  Positive for cough. Negative for chest tightness and shortness of breath.   Cardiovascular:  Negative for chest pain.  Neurological:  Negative for headaches.        Past Medical History:  Diagnosis Date   Asthma    Bronchitis    Diabetes mellitus without complication (HCC)    Gestational diabetes    GI bleeding    Headache    Heart murmur    History of gestational diabetes 05/27/2016   Dx @ 9wks    A1C: 5.7  Glyburide '5mg'$  BID    2hr pp GTT: 105/170/144   A1C:___   HSV-2 (herpes simplex  virus 2) infection    Hx of chlamydia infection    Hx of trichomoniasis    Hypertension    Ovarian cyst    Previous cesarean section 05/23/2016   Vag then c/s x 3  1st: FTP @ 6cm, 2nd & 3rd: repeat      Syphilis     Social History   Socioeconomic History   Marital status: Divorced    Spouse name: Not on file   Number of children: Not on file   Years of education: Not on file   Highest education level: Not on file  Occupational History   Not on file  Tobacco Use   Smoking status: Never   Smokeless tobacco: Never  Vaping Use   Vaping Use: Never used  Substance and Sexual Activity   Alcohol use: Yes    Alcohol/week: 0.0 standard drinks    Comment: occasionally   Drug use: No   Sexual activity: Not Currently    Partners: Male    Birth control/protection: None  Other Topics Concern   Not on file  Social History Narrative   Not on file   Social Determinants of Health   Financial Resource Strain: Not on file  Food Insecurity: Not on file  Transportation Needs: Not on file  Physical Activity: Not on file  Stress: Not on file  Social Connections: Not on file  Intimate Partner Violence: Not on  file    Past Surgical History:  Procedure Laterality Date   CESAREAN SECTION     C/S x 2   CESAREAN SECTION N/A 12/19/2016   Procedure: REPEAT CESAREAN SECTION;  Surgeon: Florian Buff, MD;  Location: [facility];  Service: Obstetrics;  Laterality: N/A;   COSMETIC SURGERY     FOOT SURGERY     LIPOSUCTION     TONSILLECTOMY     WISDOM TOOTH EXTRACTION      Family History  Problem Relation Age of Onset   Hypertension Mother    Diabetes Mother    Kidney disease Mother    Cancer Father        trachea    Alzheimer's disease Paternal Grandmother     Allergies  Allergen Reactions   Ace Inhibitors Shortness Of Breath    Short of breath   Latex Other (See Comments)    Causes irritation.    Current Outpatient Medications on File Prior to Visit  Medication Sig  Dispense Refill   albuterol (PROAIR HFA) 108 (90 Base) MCG/ACT inhaler Inhale 2 puffs into the lungs every 4 (four) hours as needed for wheezing or shortness of breath. Reported on 10/08/2015 1 Inhaler 3   amLODipine (NORVASC) 5 MG tablet Take 1 tablet by mouth once daily for blood pressure 90 tablet 1   benzonatate (TESSALON) 200 MG capsule Take 400 mg by mouth every 8 (eight) hours as needed.     ibuprofen (ADVIL,MOTRIN) 600 MG tablet Take 1 tablet (600 mg total) by mouth every 6 (six) hours as needed for headache or moderate pain. 30 tablet 0   ipratropium-albuterol (DUONEB) 0.5-2.5 (3) MG/3ML SOLN Take 3 mLs by nebulization every 4 (four) hours. (Patient not taking: Reported on 01/07/2022) 360 mL 0   No current facility-administered medications on file prior to visit.    BP 130/88   Pulse 77   Temp 98.6 F (37 C) (Oral)   Ht '5\' 1"'$  (1.549 m)   Wt 228 lb (103.4 kg)   SpO2 99%   BMI 43.08 kg/m  Objective:   Physical Exam HENT:     Right Ear: Tympanic membrane and ear canal normal.     Left Ear: Tympanic membrane and ear canal normal.     Nose:     Right Sinus: No maxillary sinus tenderness or frontal sinus tenderness.     Left Sinus: No maxillary sinus tenderness or frontal sinus tenderness.  Eyes:     Conjunctiva/sclera: Conjunctivae normal.  Cardiovascular:     Rate and Rhythm: Normal rate and regular rhythm.  Pulmonary:     Effort: Pulmonary effort is normal.     Breath sounds: Normal breath sounds. No wheezing or rales.     Comments: Dry cough noted throughout visit Musculoskeletal:     Cervical back: Neck supple.  Lymphadenopathy:     Cervical: No cervical adenopathy.  Skin:    General: Skin is warm and dry.          Assessment & Plan:      This visit occurred during the SARS-CoV-2 public health emergency.  Safety protocols were in place, including screening questions prior to the visit, additional usage of staff PPE, and extensive cleaning of exam room while  observing appropriate contact time as indicated for disinfecting solutions.

## 2022-01-07 NOTE — Assessment & Plan Note (Signed)
Differentials include asthma and GERD.  Lungs clear today.   Start Flovent 110 mcg inhaler BID. Start omeprazole 20-40 mg nightly.  Discussed that this may take several weeks to resolve.  She will update.

## 2022-01-07 NOTE — Addendum Note (Signed)
Addended by: Pleas Koch on: 01/07/2022 12:01 PM   Modules accepted: Orders

## 2022-01-07 NOTE — Assessment & Plan Note (Signed)
Controlled.  Continue amlodipine 5 mg.

## 2022-02-16 ENCOUNTER — Encounter: Payer: Self-pay | Admitting: Primary Care

## 2022-02-16 ENCOUNTER — Ambulatory Visit: Payer: Medicaid Other | Admitting: Primary Care

## 2022-02-16 VITALS — BP 130/74 | HR 67 | Temp 98.6°F | Ht 61.0 in | Wt 228.0 lb

## 2022-02-16 DIAGNOSIS — R053 Chronic cough: Secondary | ICD-10-CM | POA: Diagnosis not present

## 2022-02-16 DIAGNOSIS — R079 Chest pain, unspecified: Secondary | ICD-10-CM

## 2022-02-16 DIAGNOSIS — J4540 Moderate persistent asthma, uncomplicated: Secondary | ICD-10-CM

## 2022-02-16 DIAGNOSIS — K219 Gastro-esophageal reflux disease without esophagitis: Secondary | ICD-10-CM

## 2022-02-16 DIAGNOSIS — R1031 Right lower quadrant pain: Secondary | ICD-10-CM | POA: Insufficient documentation

## 2022-02-16 MED ORDER — BUDESONIDE-FORMOTEROL FUMARATE 80-4.5 MCG/ACT IN AERO
2.0000 | INHALATION_SPRAY | Freq: Two times a day (BID) | RESPIRATORY_TRACT | 3 refills | Status: DC
Start: 2022-02-16 — End: 2022-03-16

## 2022-02-16 MED ORDER — OMEPRAZOLE 20 MG PO CPDR: 20.0000 mg | DELAYED_RELEASE_CAPSULE | Freq: Two times a day (BID) | ORAL | 0 refills | Status: DC

## 2022-02-16 NOTE — Progress Notes (Signed)
Subjective:    Patient ID: Tina Wall, female    DOB: 09/21/1980, 41 y.o.   MRN: [id number]  Cough Associated symptoms include chest pain, shortness of breath and wheezing. Pertinent negatives include no fever.    Tina Wall is a very pleasant 41 y.o. female with a history of asthma, hypertension, prediabetes, chronic cough who presents today for follow up of chronic cough. She would also like mention abdominal spasm.   1) Persistent Cough: She was last evaluated on 01/07/2022 for a 20-monthhistory of constant cough occurring during the day and also at night.  She has been treated multiple times at various urgent cares with antibiotics, inhalers, cough syrup with minimal improvement.  During her last visit we initiated Flovent 110 mcg twice daily and omeprazole 20 to 40 mg at bedtime for potential asthma versus GERD induced cough.  She was asked to return 1 month later for follow-up.  Since her last visit she did not pick up the Flovent 110 mcg inhaler as it was too expensive, and she did not pick up the omeprazole prescription. She did take prednisone that was prescribed to her by an urgent care and did not notice improvement.   She did pick up some omeprazole 20 mg OTC for 14 days with a slight improvement. She has noticed increased esophageal burning, especially after drinking an alcoholic beverage. Her cough is worse at night and worse with eating. She works as a nPsychologist, counsellingat [employer]   She believes she had an asthma attack last week while going into work last week. She used her albuterol inhaler a few times with relief. She has noticed her asthma symptoms increasing over the last several months since her cough began.   She has noticed left anterior chest and left upper back pain that began a couple of months ago. She describes her pain as a muscle soreness but she's noticed some chest pain with movement.   2) Abdominal Spasm: Chronic for years and located to the right lower  abdomen. She describes a "ball like, knot like" sensation that will only occur when leaning/bending forward. She will have to straighten up/stretch her body in order to relieve her symptoms.  She can sometimes feel a mass at the site. These symptoms began prior to her liposuction and associated complications in 22458  She denies constipation, fevers, chills. She works as a nPsychologist, counsellingand does a lot of pushing and pulling, lifting.    Review of Systems  Constitutional:  Negative for fever.  Respiratory:  Positive for cough, chest tightness, shortness of breath and wheezing.   Cardiovascular:  Positive for chest pain.  Gastrointestinal:  Positive for abdominal pain. Negative for constipation.         Past Medical History:  Diagnosis Date   Asthma    Bronchitis    [place]    Gestational diabetes    GI bleeding    Headache    Heart murmur    History of gestational diabetes 05/27/2016   Dx @ 9wks    A1C: 5.7  Glyburide '5mg'$  BID    2hr pp GTT: 105/170/144   A1C:___   HSV-2 (herpes simplex virus 2) infection    Hx of chlamydia infection    Hx of trichomoniasis    Hypertension    Ovarian cyst    Previous cesarean section 05/23/2016   Vag then c/s x 3  1st: FTP @ 6cm, 2nd & 3rd: repeat  Syphilis     Social History   Socioeconomic History   Marital status: Divorced    Spouse name: Not on file   Number of children: Not on file   Years of education: Not on file   Highest education level: Not on file  Occupational History   Not on file  Tobacco Use   Smoking status: Never   Smokeless tobacco: Never  Vaping Use   Vaping Use: Never used  Substance and Sexual Activity   Alcohol use: Yes    Alcohol/week: 0.0 standard drinks of alcohol    Comment: occasionally   Drug use: No   Sexual activity: Not Currently    Partners: Male    Birth control/protection: None  Other Topics Concern   Not on file  Social History Narrative   Not on  file   Social Determinants of Health   Financial Resource Strain: Not on file  Food Insecurity: Not on file  Transportation Needs: Not on file  Physical Activity: Not on file  Stress: Not on file  Social Connections: Not on file  Intimate Partner Violence: Not on file    Past Surgical History:  Procedure Laterality Date   CESAREAN SECTION     C/S x 2   CESAREAN SECTION N/A 12/19/2016   Procedure: REPEAT CESAREAN SECTION;  Surgeon: Florian Buff, MD;  Location: [facility];  Service: Obstetrics;  Laterality: N/A;   COSMETIC SURGERY     FOOT SURGERY     LIPOSUCTION     TONSILLECTOMY     WISDOM TOOTH EXTRACTION      Family History  Problem Relation Age of Onset   Hypertension Mother    Diabetes Mother    Kidney disease Mother    Cancer Father        trachea    Alzheimer's disease Paternal Grandmother     Allergies  Allergen Reactions   Ace Inhibitors Shortness Of Breath    Short of breath   Latex Other (See Comments)    Causes irritation.    Current Outpatient Medications on File Prior to Visit  Medication Sig Dispense Refill   albuterol (PROAIR HFA) 108 (90 Base) MCG/ACT inhaler Inhale 2 puffs into the lungs every 4 (four) hours as needed for wheezing or shortness of breath. Reported on 10/08/2015 1 Inhaler 3   amLODipine (NORVASC) 5 MG tablet Take 1 tablet (5 mg total) by mouth daily. for blood pressure 90 tablet 0   benzonatate (TESSALON) 200 MG capsule Take 400 mg by mouth every 8 (eight) hours as needed.     ibuprofen (ADVIL,MOTRIN) 600 MG tablet Take 1 tablet (600 mg total) by mouth every 6 (six) hours as needed for headache or moderate pain. 30 tablet 0   ipratropium-albuterol (DUONEB) 0.5-2.5 (3) MG/3ML SOLN Take 3 mLs by nebulization every 4 (four) hours. (Patient not taking: Reported on 01/07/2022) 360 mL 0   No current facility-administered medications on file prior to visit.    BP 130/74   Pulse 67   Temp 98.6 F (37 C) (Oral)   Ht '5\' 1"'$  (1.549  m)   Wt 228 lb (103.4 kg)   SpO2 98%   BMI 43.08 kg/m  Objective:   Physical Exam Cardiovascular:     Rate and Rhythm: Normal rate and regular rhythm.  Pulmonary:     Effort: Pulmonary effort is normal.     Breath sounds: Normal breath sounds.  Abdominal:     Palpations: Abdomen is soft.  Tenderness: There is no abdominal tenderness.     Comments: Firm area noted to right lower abdomen at site of symptoms  Musculoskeletal:     Cervical back: Neck supple.  Skin:    General: Skin is warm and dry.  Psychiatric:        Mood and Affect: Mood normal.           Assessment & Plan:   Problem List Items Addressed This Visit       Respiratory   Asthma    Uncontrolled.  Unfortunately Flovent and several other inhaled corticosteroid regimens are cost prohibitive.  Trial of Symbicort sent to pharmacy.  She will update if this too was cost prohibitive.  Follow-up in 1 month.      Relevant Medications   budesonide-formoterol (SYMBICORT) 80-4.5 MCG/ACT inhaler     Digestive   GERD (gastroesophageal reflux disease)    Strongly suggest this is contributing to her persistent cough.  Start omeprazole 20 mg twice daily. Discussed trigger foods/beverages.  Follow-up in 1 month.      Relevant Medications   omeprazole (PRILOSEC) 20 MG capsule     Other   Persistent cough for 3 weeks or longer - Primary    No improvement, also did not pick up prescribed regimen or notify me that she could not afford her Flovent inhaler.  Discussion today regarding the importance of following through on recommended treatment. Start omeprazole 20 mg twice daily for the foreseeable future.  Start Symbicort inhaler, 2 puffs twice daily.  I have asked her to notify me if this is cost prohibitive.  At that point we will have her call her insurance company to find out what they will cover.  Suspect her chest wall pain is secondary to persistent cough.  ECG today with normal sinus rhythm, rate  of 81, no PAC/PVC, no acute ST changes.  Appears similar to ECG from 2020.  Follow-up closely in 1 month.      Relevant Medications   omeprazole (PRILOSEC) 20 MG capsule   Right lower quadrant abdominal pain    Reviewed CT scan abdomen/pelvis from 2020.  Abdominal ultrasound ordered to evaluate for hernia. Await results.      Relevant Orders   US Abdomen Limited   Other Visit Diagnoses     Chest pain, unspecified type       Relevant Orders   EKG 12-Lead (Completed)          Pleas Koch, NP

## 2022-02-16 NOTE — Assessment & Plan Note (Addendum)
No improvement, also did not pick up prescribed regimen or notify me that she could not afford her Flovent inhaler.  Discussion today regarding the importance of following through on recommended treatment. Start omeprazole 20 mg twice daily for the foreseeable future.  Start Symbicort inhaler, 2 puffs twice daily.  I have asked her to notify me if this is cost prohibitive.  At that point we will have her call her insurance company to find out what they will cover.  Suspect her chest wall pain is secondary to persistent cough.  ECG today with normal sinus rhythm, rate of 81, no PAC/PVC, no acute ST changes.  Appears similar to ECG from 2020.  Follow-up closely in 1 month.

## 2022-02-16 NOTE — Assessment & Plan Note (Signed)
Reviewed CT scan abdomen/pelvis from 2020.  Abdominal ultrasound ordered to evaluate for hernia. Await results.

## 2022-02-16 NOTE — Assessment & Plan Note (Addendum)
Uncontrolled.  Unfortunately Flovent and several other inhaled corticosteroid regimens are cost prohibitive.  Trial of Symbicort sent to pharmacy.  She will update if this too was cost prohibitive.  Follow-up in 1 month.

## 2022-02-16 NOTE — Patient Instructions (Addendum)
Start budesonide-formoterol (Symbicort) inhaler for asthma.  Inhale 2 puffs into the lungs twice daily.  Start omeprazole 20 mg for heartburn and cough.  Take 1 capsule by mouth twice daily.  Please notify me if these medications are too expensive.  You will be contacted regarding your abdominal ultrasound.  Please let us know if you have not been contacted within two weeks.   Schedule follow-up visit in 1 month.  It was a pleasure to see you today!

## 2022-02-16 NOTE — Assessment & Plan Note (Signed)
Strongly suggest this is contributing to her persistent cough.  Start omeprazole 20 mg twice daily. Discussed trigger foods/beverages.  Follow-up in 1 month.

## 2022-02-17 ENCOUNTER — Telehealth: Payer: Self-pay | Admitting: Primary Care

## 2022-02-17 NOTE — Telephone Encounter (Signed)
Please have patient contact her insurance company to find out which inhaler for asthma they will cover.  Make sure that she tells her insurance company that the [place] and now Symbicort is too expensive.  Did she need a work note for today or for her visit yesterday?

## 2022-02-17 NOTE — Telephone Encounter (Signed)
Patient called and stated the medication budesonide-formoterol (SYMBICORT) 80-4.5 MCG/ACT inhaler was expensive and would like a work note for 02/17/2022 and would like a call back [phone].

## 2022-02-18 NOTE — Telephone Encounter (Signed)
Called number x 2 can not be completed at this time.

## 2022-03-08 NOTE — Telephone Encounter (Signed)
Left message to return call to our office.  

## 2022-03-09 NOTE — Telephone Encounter (Signed)
Called patient x 3 left message to call office. No call back sent my chart message to call office.

## 2022-03-16 ENCOUNTER — Encounter: Payer: Self-pay | Admitting: Primary Care

## 2022-03-16 ENCOUNTER — Ambulatory Visit: Payer: Medicaid Other | Admitting: Primary Care

## 2022-03-16 VITALS — BP 132/78 | HR 78 | Temp 98.6°F | Ht 61.0 in | Wt 229.0 lb

## 2022-03-16 DIAGNOSIS — K219 Gastro-esophageal reflux disease without esophagitis: Secondary | ICD-10-CM | POA: Diagnosis not present

## 2022-03-16 DIAGNOSIS — Z6841 Body Mass Index (BMI) 40.0 and over, adult: Secondary | ICD-10-CM

## 2022-03-16 DIAGNOSIS — R053 Chronic cough: Secondary | ICD-10-CM

## 2022-03-16 MED ORDER — OMEPRAZOLE 40 MG PO CPDR
40.0000 mg | DELAYED_RELEASE_CAPSULE | Freq: Every day | ORAL | 0 refills | Status: DC
Start: 2022-03-16 — End: 2022-12-20

## 2022-03-16 NOTE — Assessment & Plan Note (Signed)
Improved, but still with cough.  Increase omeprazole to 40 mg daily. She will update.

## 2022-03-16 NOTE — Progress Notes (Signed)
Subjective:    Patient ID: Tina Wall, female    DOB: 11-03-1980, 41 y.o.   MRN: [id number]  HPI  Tina Wall is a very pleasant 41 y.o. female with a history of asthma, hypertension, GERD, persistent cough who presents today for follow-up of persistent cough and to discuss her obesity.   1) Chronic Cough: She was last evaluated on 02/16/2022 for follow-up of chronic cough.  During this visit she admitted noncompliance to the Flovent inhaler as it was cost prohibitive.  She did start omeprazole 20 mg OTC for 14-day trial with a slight improvement in cough. During this visit we initiated Symbicort inhaler daily. She was advised to resume omeprazole 20 mg twice daily.  Since her last visit she has not taken the Symbicort as it was not covered by her insurance. She's been taking omeprazole 20 mg once daily in the morning. Overall her cough has improved until last night. She continues to cough during the day, but last night at work she noticed increased cough again. She has noticed occasional wheezing, not bothersome or causing shortness of breath. She has an albuterol inhaler for which she uses infrequently.   She denies esophageal burning, belching.   2) Morbid Obesity: Chronic for years, weight fluctuates over the years. She was able to lose 30 pounds a few years ago through improvement in her diet and walking. She has since regained her weight.   She is not walking due to her busy work schedule. She works nights as a Psychologist, counselling.   Diet currently consists of:  Breakfast: Sometimes skips, left overs from dinner. Lunch: Skips Dinner: Pasta, air fry chicken, take out/fast food 3 days weekly  Snacks: None Desserts: Chocolate, none recently  Beverages: Juice, sweet tea, soda, some water. Overall little water intake.   Exercise: No regular exercise   She recently contacted a bariatric surgeon through [facility] and has a consultation scheduled for early August.    Wt Readings  from Last 3 Encounters:  03/16/22 229 lb (103.9 kg)  02/16/22 228 lb (103.4 kg)  01/07/22 228 lb (103.4 kg)     Review of Systems  Respiratory:  Positive for cough and wheezing. Negative for shortness of breath.   Cardiovascular:  Negative for chest pain.  Gastrointestinal:  Negative for nausea.       Denies esophageal burning          Past Medical History:  Diagnosis Date   Asthma    Bronchitis    Diabetes mellitus without complication ([place])    Gestational diabetes    GI bleeding    Headache    Heart murmur    History of gestational diabetes 05/27/2016   Dx @ 9wks    A1C: 5.7  Glyburide '5mg'$  BID    2hr pp GTT: 105/170/144   A1C:___   HSV-2 (herpes simplex virus 2) infection    Hx of chlamydia infection    Hx of trichomoniasis    Hypertension    Ovarian cyst    Previous cesarean section 05/23/2016   Vag then c/s x 3  1st: FTP @ 6cm, 2nd & 3rd: repeat      Syphilis     Social History   Socioeconomic History   Marital status: Divorced    Spouse name: Not on file   Number of children: Not on file   Years of education: Not on file   Highest education level: Not on file  Occupational History   Not on file  Tobacco Use   Smoking status: Never   Smokeless tobacco: Never  Vaping Use   Vaping Use: Never used  Substance and Sexual Activity   Alcohol use: Yes    Alcohol/week: 0.0 standard drinks of alcohol    Comment: occasionally   Drug use: No   Sexual activity: Not Currently    Partners: Male    Birth control/protection: None  Other Topics Concern   Not on file  Social History Narrative   Not on file   Social Determinants of Health   Financial Resource Strain: Not on file  Food Insecurity: Not on file  Transportation Needs: Not on file  Physical Activity: Not on file  Stress: Not on file  Social Connections: Not on file  Intimate Partner Violence: Not on file    Past Surgical History:  Procedure Laterality Date   CESAREAN SECTION     C/S x 2    CESAREAN SECTION N/A 12/19/2016   Procedure: REPEAT CESAREAN SECTION;  Surgeon: Florian Buff, MD;  Location: [facility];  Service: Obstetrics;  Laterality: N/A;   COSMETIC SURGERY     FOOT SURGERY     LIPOSUCTION     TONSILLECTOMY     WISDOM TOOTH EXTRACTION      Family History  Problem Relation Age of Onset   Hypertension Mother    Diabetes Mother    Kidney disease Mother    Cancer Father        trachea    Alzheimer's disease Paternal Grandmother     Allergies  Allergen Reactions   Ace Inhibitors Shortness Of Breath    Short of breath   Latex Other (See Comments)    Causes irritation.    Current Outpatient Medications on File Prior to Visit  Medication Sig Dispense Refill   albuterol (PROAIR HFA) 108 (90 Base) MCG/ACT inhaler Inhale 2 puffs into the lungs every 4 (four) hours as needed for wheezing or shortness of breath. Reported on 10/08/2015 1 Inhaler 3   amLODipine (NORVASC) 5 MG tablet Take 1 tablet (5 mg total) by mouth daily. for blood pressure 90 tablet 0   ibuprofen (ADVIL,MOTRIN) 600 MG tablet Take 1 tablet (600 mg total) by mouth every 6 (six) hours as needed for headache or moderate pain. 30 tablet 0   ipratropium-albuterol (DUONEB) 0.5-2.5 (3) MG/3ML SOLN Take 3 mLs by nebulization every 4 (four) hours. 360 mL 0   No current facility-administered medications on file prior to visit.    BP 132/78   Pulse 78   Temp 98.6 F (37 C) (Oral)   Ht '5\' 1"'$  (1.549 m)   Wt 229 lb (103.9 kg)   SpO2 97%   BMI 43.27 kg/m  Objective:   Physical Exam Cardiovascular:     Rate and Rhythm: Normal rate and regular rhythm.  Pulmonary:     Effort: Pulmonary effort is normal.     Breath sounds: Normal breath sounds.     Comments: Dry cough noted a few times throughout visit.  Musculoskeletal:     Cervical back: Neck supple.  Skin:    General: Skin is warm and dry.           Assessment & Plan:   Problem List Items Addressed This Visit       Digestive    GERD (gastroesophageal reflux disease)    Improved, but still with cough.  Increase omeprazole to 40 mg daily. She will update.       Relevant Medications  omeprazole (PRILOSEC) 40 MG capsule     Other   Persistent cough for 3 weeks or longer - Primary    Improved but continues.  Symbicort cost prohibitive. She is only taking omeprazole 20 mg once daily.   Increase omeprazole to 40 mg daily. She will update in a few weeks.       Relevant Medications   omeprazole (PRILOSEC) 40 MG capsule   Morbid obesity with BMI of 40.0-44.9, adult ([place])    Strongly encouraged she work on her diet, start exercising, cut out fast food, cut out sugary drinks.  She will be meeting with a bariatric surgeon soon. Will await those notes.           Pleas Koch, NP

## 2022-03-16 NOTE — Assessment & Plan Note (Signed)
Improved but continues.  Symbicort cost prohibitive. She is only taking omeprazole 20 mg once daily.   Increase omeprazole to 40 mg daily. She will update in a few weeks.

## 2022-03-16 NOTE — Assessment & Plan Note (Signed)
Strongly encouraged she work on her diet, start exercising, cut out fast food, cut out sugary drinks.  She will be meeting with a bariatric surgeon soon. Will await those notes.

## 2022-03-16 NOTE — Patient Instructions (Addendum)
We increased your omeprazole to 40 mg once daily. I sent a new prescription to your pharmacy.   Follow up with your GYN regarding the IUD and pelvic pain.  Complete your abdominal ultrasound by calling the [facility] at [phone]  It was a pleasure to see you today!

## 2022-03-18 ENCOUNTER — Ambulatory Visit: Payer: Medicaid Other | Admitting: Primary Care

## 2022-04-19 ENCOUNTER — Other Ambulatory Visit (HOSPITAL_COMMUNITY)
Admission: RE | Admit: 2022-04-19 | Discharge: 2022-04-19 | Disposition: A | Discharge status: Home / Self Care | Payer: Medicaid Other | Source: Ambulatory Visit | Attending: Primary Care | Admitting: Primary Care

## 2022-04-19 ENCOUNTER — Encounter: Payer: Self-pay | Admitting: Primary Care

## 2022-04-19 ENCOUNTER — Ambulatory Visit (INDEPENDENT_AMBULATORY_CARE_PROVIDER_SITE_OTHER): Payer: Medicaid Other | Admitting: Primary Care

## 2022-04-19 ENCOUNTER — Telehealth: Payer: Self-pay | Admitting: Primary Care

## 2022-04-19 VITALS — BP 138/80 | HR 82 | Temp 98.0°F | Ht 61.75 in | Wt 230.4 lb

## 2022-04-19 DIAGNOSIS — F32A Depression, unspecified: Secondary | ICD-10-CM | POA: Diagnosis not present

## 2022-04-19 DIAGNOSIS — I10 Essential (primary) hypertension: Secondary | ICD-10-CM | POA: Diagnosis not present

## 2022-04-19 DIAGNOSIS — Z124 Encounter for screening for malignant neoplasm of cervix: Secondary | ICD-10-CM | POA: Insufficient documentation

## 2022-04-19 DIAGNOSIS — R7303 Prediabetes: Secondary | ICD-10-CM | POA: Diagnosis not present

## 2022-04-19 DIAGNOSIS — F419 Anxiety disorder, unspecified: Secondary | ICD-10-CM | POA: Diagnosis not present

## 2022-04-19 DIAGNOSIS — Z1231 Encounter for screening mammogram for malignant neoplasm of breast: Secondary | ICD-10-CM

## 2022-04-19 DIAGNOSIS — R053 Chronic cough: Secondary | ICD-10-CM

## 2022-04-19 DIAGNOSIS — Z0001 Encounter for general adult medical examination with abnormal findings: Secondary | ICD-10-CM | POA: Diagnosis not present

## 2022-04-19 DIAGNOSIS — M779 Enthesopathy, unspecified: Secondary | ICD-10-CM | POA: Insufficient documentation

## 2022-04-19 HISTORY — DX: Enthesopathy, unspecified: M77.9

## 2022-04-19 LAB — COMPREHENSIVE METABOLIC PANEL
ALT: 27 U/L (ref 0–35)
AST: 21 U/L (ref 0–37)
Albumin: 4.2 g/dL (ref 3.5–5.2)
Alkaline Phosphatase: 43 U/L (ref 39–117)
BUN: 12 mg/dL (ref 6–23)
CO2: 27 mEq/L (ref 19–32)
Calcium: 9.3 mg/dL (ref 8.4–10.5)
Chloride: 104 mEq/L (ref 96–112)
Creatinine, Ser: 0.99 mg/dL (ref 0.40–1.20)
GFR: 71.34 mL/min (ref >60.00)
Glucose, Bld: 102 mg/dL — ABNORMAL HIGH (ref 70–99)
Potassium: 3.6 mEq/L (ref 3.5–5.1)
Sodium: 138 mEq/L (ref 135–145)
Total Bilirubin: 0.5 mg/dL (ref 0.2–1.2)
Total Protein: 7.3 g/dL (ref 6.0–8.3)

## 2022-04-19 LAB — LIPID PANEL
Cholesterol: 215 mg/dL — ABNORMAL HIGH (ref 0–200)
HDL: 46.1 mg/dL (ref >39.00)
LDL Cholesterol: 137 mg/dL — ABNORMAL HIGH (ref 0–99)
NonHDL: 168.43
Total CHOL/HDL Ratio: 5
Triglycerides: 158 mg/dL — ABNORMAL HIGH (ref 0.0–149.0)
VLDL: 31.6 mg/dL (ref 0.0–40.0)

## 2022-04-19 LAB — HEMOGLOBIN A1C: Hgb A1c MFr Bld: 6.4 % (ref 4.6–6.5)

## 2022-04-19 MED ORDER — MELOXICAM 15 MG PO TABS
15.0000 mg | ORAL_TABLET | Freq: Every day | ORAL | 0 refills | Status: DC
Start: 2022-04-19 — End: 2022-12-20

## 2022-04-19 NOTE — Assessment & Plan Note (Signed)
No amlodipine consumption in 2 days per patient. She will resume.   Resume amlodipine 5 mg daily.

## 2022-04-19 NOTE — Assessment & Plan Note (Signed)
Discussed the importance of a healthy diet and regular exercise in order for weight loss, and to reduce the risk of further co-morbidity. ? ?Repeat A1C pending. ?

## 2022-04-19 NOTE — Progress Notes (Signed)
Subjective:    Patient ID: Tina Wall, female    DOB: [birth year], 41 y.o.   MRN: [id number]  HPI  Tina Wall is a very pleasant 41 y.o. female who presents today for complete physical and follow up of chronic conditions.  She would also like to discuss anxiety and depression. Long history of anxiety and depression since childhood. She's been experiencing increased symptoms of depression and anxiety as she's been under a lot of personal stress. She was provided a prescription for sertraline last year by her prior PCP for depression for which she never took. She is considering starting now. She has filled the Rx. She is not seeing therapy.   She would also like to discuss chronic upper extremity pain. Her pain is located to the right lateral arm at the level of her elbow. She describes her pain as soreness which is constant, but worse with lifting/pushing/pulling anything. She denies numbness/tingling. 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 She's noticed her right arm will "give out" with holding a patient. She's been taking Tylenol and Ibuprofen at times with temporary improvement.   Immunizations: -Tetanus: 2017 -Covid-19: 1 vaccine   Diet: Fair diet.  Exercise: No regular exercise.  Eye exam: Completes annually  Dental exam: Completes semi-annually   Pap Smear: Completed in  Mammogram: Never completed   BP Readings from Last 3 Encounters:  04/19/22 138/80  03/16/22 132/78  02/16/22 130/74      Review of Systems  Constitutional:  Negative for unexpected weight change.  HENT:  Negative for rhinorrhea.   Eyes:  Negative for visual disturbance.  Respiratory:  Negative for cough and shortness of breath.   Cardiovascular:  Negative for chest pain.  Gastrointestinal:  Negative for constipation and diarrhea.  Genitourinary:  Negative for difficulty urinating and menstrual problem.  Musculoskeletal:  Positive for myalgias. Negative for  arthralgias.  Skin:  Negative for rash.  Allergic/Immunologic: Negative for environmental allergies.  Neurological:  Negative for dizziness, numbness and headaches.  Psychiatric/Behavioral:  The patient is nervous/anxious.          Past Medical History:  Diagnosis Date   Asthma    Bronchitis    Diabetes mellitus without complication ([facility])    Gestational diabetes    GI bleeding    Headache    Heart murmur    History of gestational diabetes 05/27/2016   Dx @ 9wks    A1C: 5.7  Glyburide '5mg'$  BID    2hr pp GTT: 105/170/144   A1C:___   HSV-2 (herpes simplex virus 2) infection    Hx of chlamydia infection    Hx of trichomoniasis    Hypertension    Ovarian cyst    Previous cesarean section 05/23/2016   Vag then c/s x 3  1st: FTP @ 6cm, 2nd & 3rd: repeat      Syphilis     Social History   Socioeconomic History   Marital status: Divorced    Spouse name: Not on file   Number of children: Not on file   Years of education: Not on file   Highest education level: Not on file  Occupational History   Not on file  Tobacco Use   Smoking status: Never   Smokeless tobacco: Never  Vaping Use   Vaping Use: Never used  Substance and Sexual Activity   Alcohol use: Yes    Alcohol/week: 0.0 standard drinks of alcohol    Comment: occasionally  Drug use: No   Sexual activity: Not Currently    Partners: Male    Birth control/protection: None  Other Topics Concern   Not on file  Social History Narrative   Not on file   Social Determinants of Health   Financial Resource Strain: Not on file  Food Insecurity: Not on file  Transportation Needs: Not on file  Physical Activity: Not on file  Stress: Not on file  Social Connections: Not on file  Intimate Partner Violence: Not on file    Past Surgical History:  Procedure Laterality Date   CESAREAN SECTION     C/S x 2   CESAREAN SECTION N/A 12/19/2016   Procedure: REPEAT CESAREAN SECTION;  Surgeon: Florian Buff, MD;  Location: [facility];  Service: Obstetrics;  Laterality: N/A;   COSMETIC SURGERY     FOOT SURGERY     LIPOSUCTION     TONSILLECTOMY     WISDOM TOOTH EXTRACTION      Family History  Problem Relation Age of Onset   Hypertension Mother    Diabetes Mother    Kidney disease Mother    Cancer Father        trachea    Alzheimer's disease Paternal Grandmother     Allergies  Allergen Reactions   Ace Inhibitors Shortness Of Breath    Short of breath   Latex Other (See Comments)    Causes irritation.    Current Outpatient Medications on File Prior to Visit  Medication Sig Dispense Refill   amLODipine (NORVASC) 5 MG tablet Take 1 tablet (5 mg total) by mouth daily. for blood pressure 90 tablet 0   ibuprofen (ADVIL,MOTRIN) 600 MG tablet Take 1 tablet (600 mg total) by mouth every 6 (six) hours as needed for headache or moderate pain. 30 tablet 0   omeprazole (PRILOSEC) 40 MG capsule Take 1 capsule (40 mg total) by mouth daily. For heartburn and cough 90 capsule 0   albuterol (PROAIR HFA) 108 (90 Base) MCG/ACT inhaler Inhale 2 puffs into the lungs every 4 (four) hours as needed for wheezing or shortness of breath. Reported on 10/08/2015 (Patient not taking: Reported on 04/19/2022) 1 Inhaler 3   ipratropium-albuterol (DUONEB) 0.5-2.5 (3) MG/3ML SOLN Take 3 mLs by nebulization every 4 (four) hours. (Patient not taking: Reported on 04/19/2022) 360 mL 0   No current facility-administered medications on file prior to visit.    BP 138/80   Pulse 82   Temp 98 F (36.7 C) (Oral)   Ht 5' 1.75" (1.568 m)   Wt 230 lb 6.4 oz (104.5 kg)   SpO2 98%   BMI 42.48 kg/m  Objective:   Physical Exam HENT:     Right Ear: Tympanic membrane and ear canal normal.     Left Ear: Tympanic membrane and ear canal normal.     Nose: Nose normal.  Eyes:     Conjunctiva/sclera: Conjunctivae normal.     Pupils: Pupils are equal, round, and reactive to light.  Neck:     Thyroid: No thyromegaly.  Cardiovascular:      Rate and Rhythm: Normal rate and regular rhythm.     Heart sounds: No murmur heard. Pulmonary:     Effort: Pulmonary effort is normal.     Breath sounds: Normal breath sounds. No rales.  Abdominal:     General: Bowel sounds are normal.     Palpations: Abdomen is soft.     Tenderness: There is no abdominal tenderness.  Musculoskeletal:  General: Normal range of motion.     Right upper arm: No swelling or tenderness.     Right elbow: No swelling or deformity. Normal range of motion.       Arms:     Cervical back: Neck supple.     Comments: 5/5 strength to bilateral upper extremities. Pain to right lateral arm with pushing and pulling.  Lymphadenopathy:     Cervical: No cervical adenopathy.  Skin:    General: Skin is warm and dry.     Findings: No rash.  Neurological:     Mental Status: She is alert and oriented to person, place, and time.     Cranial Nerves: No cranial nerve deficit.     Deep Tendon Reflexes: Reflexes are normal and symmetric.  Psychiatric:        Mood and Affect: Mood normal.           Assessment & Plan:   Problem List Items Addressed This Visit       Cardiovascular and Mediastinum   Essential hypertension    No amlodipine consumption in 2 days per patient. She will resume.   Resume amlodipine 5 mg daily.      Relevant Orders   Lipid panel   Hemoglobin A1c   Comprehensive metabolic panel     Musculoskeletal and Integument   Tendonitis    Suspect symptoms to right upper extremity are secondary to overuse.  Discussed to rest when possible. Rx meloxicam 15 mg daily sent to pharmacy.  She will update in a few weeks. Consider sports medicine evaluation.      Relevant Medications   meloxicam (MOBIC) 15 MG tablet     Other   Borderline diabetes    Discussed the importance of a healthy diet and regular exercise in order for weight loss, and to reduce the risk of further co-morbidity.  Repeat A1C pending.      Persistent cough  for 3 weeks or longer    Resolved.  Continue omeprazole 40 mg daily until her current Rx runs out. Will reduce to omeprazole 20 mg for next fill.       Anxiety and depression    Chronic, deteriorated.  Referral placed to therapy for further evaluation. She will consider starting sertraline but will notify if she decides to start.  Unclear which dose she has at home.      Relevant Orders   Ambulatory referral to Psychology   Encounter for annual general medical examination with abnormal findings in adult - Primary    Immunizations UTD. Pap smear due, completed today. Mammogram due, orders placed.  Discussed the importance of a healthy diet and regular exercise in order for weight loss, and to reduce the risk of further co-morbidity.  Exam as noted. Labs pending.  Follow up in 1 year for repeat physical.       Other Visit Diagnoses     Encounter for screening mammogram for malignant neoplasm of breast       Relevant Orders   MM 3D SCREEN BREAST BILATERAL   Screening for cervical cancer       Relevant Orders   Cytology - PAP          Pleas Koch, NP

## 2022-04-19 NOTE — Telephone Encounter (Signed)
Cecille Aver, NP At this time, we are limited on providers who are taking new patients . With out Limited provider availability, it is creating a longer wait. The Available providers are not in network with every insurance. In order not to delay patient care, if you are refer a patient that we are unable to see in a timely manner we will let you know and give you any resources we may have.     All of our Medicaid slots are full you might try [facility] or [facility]    Please notify patient of the above message from the referral coordinator with [facility]. Is she okay with [facility] or [facility]? If so I will place another referral.

## 2022-04-19 NOTE — Assessment & Plan Note (Signed)
Suspect symptoms to right upper extremity are secondary to overuse.  Discussed to rest when possible. Rx meloxicam 15 mg daily sent to pharmacy.  She will update in a few weeks. Consider sports medicine evaluation.

## 2022-04-19 NOTE — Assessment & Plan Note (Signed)
Chronic, deteriorated.  Referral placed to therapy for further evaluation. She will consider starting sertraline but will notify if she decides to start.  Unclear which dose she has at home.

## 2022-04-19 NOTE — Patient Instructions (Addendum)
Call [facility] to schedule your mammogram.   '[x]'   [facility]  [place] [address]  [phone] Stop by the lab prior to leaving today. I will notify you of your results once received.   You will be contacted regarding your referral to therapy.  Please let us know if you have not been contacted within two weeks.   Start meloxicam 15 mg once daily for arm pain and inflammation.  Consider seeing our sports medicine doctor, Dr. Lorelei Pont, for further evaluation if no improvement.  I would like to reduce your omeprazole 20 mg daily once you are due for your next refill.  This medication was used for your cough.  It was a pleasure to see you today!  Preventive Care 6-73 Years Old, Female Preventive care refers to lifestyle choices and visits with your health care provider that can promote health and wellness. Preventive care visits are also called wellness exams. What can I expect for my preventive care visit? Counseling Your health care provider may ask you questions about your: Medical history, including: Past medical problems. Family medical history. Pregnancy history. Current health, including: Menstrual cycle. Method of birth control. Emotional well-being. Home life and relationship well-being. Sexual activity and sexual health. Lifestyle, including: Alcohol, nicotine or tobacco, and drug use. Access to firearms. Diet, exercise, and sleep habits. Work and work Statistician. Sunscreen use. Safety issues such as seatbelt and bike helmet use. Physical exam Your health care provider will check your: Height and weight. These may be used to calculate your BMI (body mass index). BMI is a measurement that tells if you are at a healthy weight. Waist circumference. This measures the distance around your waistline. This measurement also tells if you are at a healthy weight and may help predict your  risk of certain diseases, such as type 2 diabetes and high blood pressure. Heart rate and blood pressure. Body temperature. Skin for abnormal spots. What immunizations do I need?  Vaccines are usually given at various ages, according to a schedule. Your health care provider will recommend vaccines for you based on your age, medical history, and lifestyle or other factors, such as travel or where you work. What tests do I need? Screening Your health care provider may recommend screening tests for certain conditions. This may include: Lipid and cholesterol levels. Diabetes screening. This is done by checking your blood sugar (glucose) after you have not eaten for a while (fasting). Pelvic exam and Pap test. Hepatitis B test. Hepatitis C test. HIV (human immunodeficiency virus) test. STI (sexually transmitted infection) testing, if you are at risk. Lung cancer screening. Colorectal cancer screening. Mammogram. Talk with your health care provider about when you should start having regular mammograms. This may depend on whether you have a family history of breast cancer. BRCA-related cancer screening. This may be done if you have a family history of breast, ovarian, tubal, or peritoneal cancers. Bone density scan. This is done to screen for osteoporosis. Talk with your health care provider about your test results, treatment options, and if necessary, the need for more tests. Follow these instructions at home: Eating and drinking  Eat a diet that includes fresh fruits and vegetables, whole grains, lean protein, and low-fat dairy products. Take vitamin and mineral supplements as recommended by your health care provider. Do not drink alcohol if: Your health care provider tells you not to drink. You are pregnant, may be pregnant, or  are planning to become pregnant. If you drink alcohol: Limit how much you have to 0-1 drink a day. Know how much alcohol is in your drink. In the [place] one drink  equals one 12 oz bottle of beer (355 mL), one 5 oz glass of wine (148 mL), or one 1 oz glass of hard liquor (44 mL). Lifestyle Brush your teeth every morning and night with fluoride toothpaste. Floss one time each day. Exercise for at least 30 minutes 5 or more days each week. Do not use any products that contain nicotine or tobacco. These products include cigarettes, chewing tobacco, and vaping devices, such as e-cigarettes. If you need help quitting, ask your health care provider. Do not use drugs. If you are sexually active, practice safe sex. Use a condom or other form of protection to prevent STIs. If you do not wish to become pregnant, use a form of birth control. If you plan to become pregnant, see your health care provider for a prepregnancy visit. Take aspirin only as told by your health care provider. Make sure that you understand how much to take and what form to take. Work with your health care provider to find out whether it is safe and beneficial for you to take aspirin daily. Find healthy ways to manage stress, such as: Meditation, yoga, or listening to music. Journaling. Talking to a trusted person. Spending time with friends and family. Minimize exposure to UV radiation to reduce your risk of skin cancer. Safety Always wear your seat belt while driving or riding in a vehicle. Do not drive: If you have been drinking alcohol. Do not ride with someone who has been drinking. When you are tired or distracted. While texting. If you have been using any mind-altering substances or drugs. Wear a helmet and other protective equipment during sports activities. If you have firearms in your house, make sure you follow all gun safety procedures. Seek help if you have been physically or sexually abused. What's next? Visit your health care provider once a year for an annual wellness visit. Ask your health care provider how often you should have your eyes and teeth checked. Stay up to  date on all vaccines. This information is not intended to replace advice given to you by your health care provider. Make sure you discuss any questions you have with your health care provider. Document Revised: 02/03/2021 Document Reviewed: 02/03/2021 Elsevier Patient Education  [employer].

## 2022-04-19 NOTE — Assessment & Plan Note (Signed)
Immunizations UTD. Pap smear due, completed today. Mammogram due, orders placed.  Discussed the importance of a healthy diet and regular exercise in order for weight loss, and to reduce the risk of further co-morbidity.  Exam as noted. Labs pending.  Follow up in 1 year for repeat physical.

## 2022-04-19 NOTE — Assessment & Plan Note (Signed)
Resolved.  Continue omeprazole 40 mg daily until her current Rx runs out. Will reduce to omeprazole 20 mg for next fill.

## 2022-04-20 ENCOUNTER — Encounter: Payer: Self-pay | Admitting: *Deleted

## 2022-04-20 NOTE — Telephone Encounter (Signed)
Noted, referral placed.  

## 2022-04-20 NOTE — Telephone Encounter (Signed)
Patient notified as instructed by telephone and verbalized understanding. Patient stated that she is okay with a referral to either place.

## 2022-04-22 LAB — CYTOLOGY - PAP
Adequacy: ABSENT
Comment: NEGATIVE
Diagnosis: NEGATIVE
High risk HPV: NEGATIVE

## 2022-06-26 ENCOUNTER — Other Ambulatory Visit: Payer: Self-pay

## 2022-06-26 ENCOUNTER — Encounter: Payer: Self-pay | Admitting: Intensive Care

## 2022-06-26 ENCOUNTER — Emergency Department
Admission: EM | Admit: 2022-06-26 | Discharge: 2022-06-26 | Disposition: A | Discharge status: Home / Self Care | Payer: Self-pay | Attending: Emergency Medicine | Admitting: Emergency Medicine

## 2022-06-26 ENCOUNTER — Emergency Department: Payer: Self-pay

## 2022-06-26 DIAGNOSIS — R102 Pelvic and perineal pain: Secondary | ICD-10-CM

## 2022-06-26 DIAGNOSIS — I10 Essential (primary) hypertension: Secondary | ICD-10-CM | POA: Insufficient documentation

## 2022-06-26 DIAGNOSIS — N83202 Unspecified ovarian cyst, left side: Secondary | ICD-10-CM | POA: Insufficient documentation

## 2022-06-26 DIAGNOSIS — N83201 Unspecified ovarian cyst, right side: Secondary | ICD-10-CM | POA: Insufficient documentation

## 2022-06-26 LAB — COMPREHENSIVE METABOLIC PANEL
ALT: 35 U/L (ref 0–44)
AST: 32 U/L (ref 15–41)
Albumin: 4.2 g/dL (ref 3.5–5.0)
Alkaline Phosphatase: 46 U/L (ref 38–126)
Anion gap: 7 (ref 5–15)
BUN: 9 mg/dL (ref 6–20)
CO2: 25 mmol/L (ref 22–32)
Calcium: 8.9 mg/dL (ref 8.9–10.3)
Chloride: 107 mmol/L (ref 98–111)
Creatinine, Ser: 0.87 mg/dL (ref 0.44–1.00)
GFR, Estimated: >60 mL/min (ref >60)
Glucose, Bld: 97 mg/dL (ref 70–99)
Potassium: 3.4 mmol/L — ABNORMAL LOW (ref 3.5–5.1)
Sodium: 139 mmol/L (ref 135–145)
Total Bilirubin: 0.8 mg/dL (ref 0.3–1.2)
Total Protein: 7.8 g/dL (ref 6.5–8.1)

## 2022-06-26 LAB — CBC
HCT: 37.1 % (ref 36.0–46.0)
Hemoglobin: 12.2 g/dL (ref 12.0–15.0)
MCH: 29.5 pg (ref 26.0–34.0)
MCHC: 32.9 g/dL (ref 30.0–36.0)
MCV: 89.6 fL (ref 80.0–100.0)
Platelets: 270 10*3/uL (ref 150–400)
RBC: 4.14 MIL/uL (ref 3.87–5.11)
RDW: 13.2 % (ref 11.5–15.5)
WBC: 4.8 10*3/uL (ref 4.0–10.5)
nRBC: 0 % (ref 0.0–0.2)

## 2022-06-26 LAB — HCG, QUANTITATIVE, PREGNANCY: hCG, Beta Chain, Quant, S: <1 m[IU]/mL (ref <5)

## 2022-06-26 LAB — LIPASE, BLOOD: Lipase: 33 U/L (ref 11–51)

## 2022-06-26 LAB — URINALYSIS, ROUTINE W REFLEX MICROSCOPIC
Bilirubin Urine: NEGATIVE
Glucose, UA: NEGATIVE mg/dL
Hgb urine dipstick: NEGATIVE
Ketones, ur: 20 mg/dL — AB
Leukocytes,Ua: NEGATIVE
Nitrite: NEGATIVE
Protein, ur: NEGATIVE mg/dL
Specific Gravity, Urine: 1.015 (ref 1.005–1.030)
pH: 7 (ref 5.0–8.0)

## 2022-06-26 LAB — POC URINE PREG, ED: Preg Test, Ur: NEGATIVE

## 2022-06-26 MED ORDER — LACTATED RINGERS IV BOLUS
1000.0000 mL | Freq: Once | INTRAVENOUS | Status: AC
  Administered 2022-06-26: 1000 mL via INTRAVENOUS

## 2022-06-26 MED ORDER — IOHEXOL 300 MG/ML  SOLN
100.0000 mL | Freq: Once | INTRAMUSCULAR | Status: AC | PRN
  Administered 2022-06-26: 100 mL via INTRAVENOUS

## 2022-06-26 NOTE — ED Provider Notes (Signed)
[facility] Provider Note    Event Date/Time   First MD Initiated Contact with Patient 06/26/22 1610     (approximate)   History   Chief Complaint Abdominal Pain and Pelvic Pain   HPI  Tina Wall is a 41 y.o. female with past medical history of hypertension who presents to the ED complaining of abdominal pain.  Patient reports that she had sudden onset of severe sharp pain in the bilateral lower quadrants of her abdomen earlier today while at rest.  Severe pain has since eased up, but she now complains of dull aching pain in the right lower quadrant, radiating up towards her right flank.  Pain does not seem to be exacerbated or alleviated by anything in particular, has been associated with nausea but she denies any vomiting or diarrhea.  She has not had any fevers, dysuria, hematuria, vaginal bleeding, or discharge.  She denies any history of similar symptoms, has never had a kidney stone.  She is unsure of her LMP.     Physical Exam   Triage Vital Signs: ED Triage Vitals  Enc Vitals Group     BP 06/26/22 1437 (!) 152/99     Pulse Rate 06/26/22 1437 78     Resp 06/26/22 1437 16     Temp 06/26/22 1437 98.5 F (36.9 C)     Temp Source 06/26/22 1437 Oral     SpO2 06/26/22 1437 98 %     Weight 06/26/22 1433 230 lb (104.3 kg)     Height 06/26/22 1433 5' (1.524 m)     Head Circumference --      Peak Flow --      Pain Score 06/26/22 1433 3     Pain Loc --      Pain Edu? --      Excl. in [place]? --     Most recent vital signs: Vitals:   06/26/22 1437 06/26/22 1850  BP: (!) 152/99 (!) 145/77  Pulse: 78 64  Resp: 16 16  Temp: 98.5 F (36.9 C) 98.7 F (37.1 C)  SpO2: 98% 98%    Constitutional: Alert and oriented. Eyes: Conjunctivae are normal. Head: Atraumatic. Nose: No congestion/rhinnorhea. Mouth/Throat: Mucous membranes are moist.  Cardiovascular: Normal rate, regular rhythm. Grossly normal heart sounds.  2+ radial pulses  bilaterally. Respiratory: Normal respiratory effort.  No retractions. Lungs CTAB. Gastrointestinal: Soft and tender to palpation in the right lower quadrant with no rebound or guarding.  No CVA tenderness bilaterally. No distention. Musculoskeletal: No lower extremity tenderness nor edema.  Neurologic:  Normal speech and language. No gross focal neurologic deficits are appreciated.    ED Results / Procedures / Treatments   Labs (all labs ordered are listed, but only abnormal results are displayed) Labs Reviewed  COMPREHENSIVE METABOLIC PANEL - Abnormal; Notable for the following components:      Result Value   Potassium 3.4 (*)    All other components within normal limits  URINALYSIS, ROUTINE W REFLEX MICROSCOPIC - Abnormal; Notable for the following components:   Color, Urine YELLOW (*)    APPearance CLEAR (*)    Ketones, ur 20 (*)    All other components within normal limits  LIPASE, BLOOD  CBC  HCG, QUANTITATIVE, PREGNANCY  POC URINE PREG, ED   RADIOLOGY CT abdomen/pelvis report reviewed and interpreted by me with no inflammatory changes, focal fluid collections, or dilated bowel loops.  PROCEDURES:  Critical Care performed: No  Procedures   MEDICATIONS ORDERED IN  ED: Medications  lactated ringers bolus 1,000 mL (1,000 mLs Intravenous New Bag/Given 06/26/22 1635)  iohexol (OMNIPAQUE) 300 MG/ML solution 100 mL (100 mLs Intravenous Contrast Given 06/26/22 1747)     IMPRESSION / MDM / ASSESSMENT AND PLAN / ED COURSE  I reviewed the triage vital signs and the nursing notes.                              41 y.o. female with past medical history of hypertension who presents to the ED with sudden onset bilateral lower quadrant abdominal pain that was initially severe, now eased off but continues to have dull aching pain in the right lower quadrant.  Patient's presentation is most consistent with acute presentation with potential threat to life or bodily  function.  Differential diagnosis includes, but is not limited to, appendicitis, kidney stone, pyelonephritis, cystitis, ovarian cyst, ovarian torsion.  Patient nontoxic-appearing and in no acute distress, vital signs are unremarkable.  She has tenderness to palpation in the right lower quadrant of her abdomen, no CVA tenderness noted on exam.  Labs thus far are reassuring with no significant anemia, leukocytosis, electrolyte abnormality, or AKI.  LFTs and lipase are also unremarkable, pregnancy testing and urinalysis are pending.  Patient reports receiving nausea medication with EMS with improvement, declines pain medication at this time.  We will further assess with CT imaging for appendicitis versus kidney stone.  CT imaging is negative for acute process, no evidence of kidney stone or appendicitis.  Patient does have bilateral ovarian cysts, which were further assessed with ultrasound, no evidence of torsion at this time.  Patient continues to have minimal pain here in the ED and intermittent torsion seems unlikely.  She is appropriate for outpatient follow-up with OB/GYN, was counseled to return to the ED for worsening pain or other worsening symptoms.  Patient agrees with plan.      FINAL CLINICAL IMPRESSION(S) / ED DIAGNOSES   Final diagnoses:  Bilateral ovarian cysts  Pelvic pain in female     Rx / DC Orders   ED Discharge Orders     None        Note:  This document was prepared using Dragon voice recognition software and may include unintentional dictation errors.   Blake Divine, MD 06/26/22 1958

## 2022-06-26 NOTE — ED Triage Notes (Signed)
Pt presents via EMS c/o lower abc pain and groin pain starting yesterday. Reports pain radiating to right flank and right lower back. 18G IV in right AC. 4mg  IV zofran given by EMS.

## 2022-06-27 ENCOUNTER — Encounter: Payer: Self-pay | Admitting: Primary Care

## 2022-08-23 ENCOUNTER — Other Ambulatory Visit: Payer: Self-pay | Admitting: Primary Care

## 2022-08-23 DIAGNOSIS — I10 Essential (primary) hypertension: Secondary | ICD-10-CM

## 2022-10-21 ENCOUNTER — Ambulatory Visit
Admission: RE | Admit: 2022-10-21 | Discharge: 2022-10-21 | Disposition: A | Discharge status: Home / Self Care | Payer: Self-pay | Source: Ambulatory Visit | Attending: Primary Care | Admitting: Primary Care

## 2022-10-21 DIAGNOSIS — Z1231 Encounter for screening mammogram for malignant neoplasm of breast: Secondary | ICD-10-CM | POA: Insufficient documentation

## 2022-12-20 ENCOUNTER — Ambulatory Visit (INDEPENDENT_AMBULATORY_CARE_PROVIDER_SITE_OTHER): Payer: Self-pay | Admitting: Primary Care

## 2022-12-20 ENCOUNTER — Encounter: Payer: Self-pay | Admitting: Primary Care

## 2022-12-20 VITALS — BP 134/80 | HR 75 | Temp 98.4°F | Ht 60.0 in | Wt 229.0 lb

## 2022-12-20 DIAGNOSIS — I10 Essential (primary) hypertension: Secondary | ICD-10-CM

## 2022-12-20 DIAGNOSIS — Z6841 Body Mass Index (BMI) 40.0 and over, adult: Secondary | ICD-10-CM

## 2022-12-20 DIAGNOSIS — R7303 Prediabetes: Secondary | ICD-10-CM

## 2022-12-20 DIAGNOSIS — K219 Gastro-esophageal reflux disease without esophagitis: Secondary | ICD-10-CM

## 2022-12-20 LAB — POCT GLYCOSYLATED HEMOGLOBIN (HGB A1C): Hemoglobin A1C: 5.9 % — AB (ref 4.0–5.6)

## 2022-12-20 MED ORDER — AMLODIPINE BESYLATE 10 MG PO TABS
10.0000 mg | ORAL_TABLET | Freq: Every day | ORAL | 0 refills | Status: DC
Start: 2022-12-20 — End: 2023-05-03

## 2022-12-20 NOTE — Patient Instructions (Signed)
Continue to work on [employer]. Cut out the fast food.  You will either be contacted via phone regarding your referral to healthy weight and wellness center, or you may receive a letter on your MyChart portal from our referral team with instructions for scheduling an appointment. Please let us know if you have not been contacted by anyone within two weeks.  Please schedule a physical to meet with me in 4 months.   It was a pleasure to see you today!

## 2022-12-20 NOTE — Assessment & Plan Note (Signed)
Improved with A1C of 5.9 today.  Long discussion regarding her diet and the need for exercise. Referral placed for [facility] placed.   Continue to monitor.

## 2022-12-20 NOTE — Assessment & Plan Note (Signed)
Controlled.  Continue to monitor. Remain off treatment.

## 2022-12-20 NOTE — Assessment & Plan Note (Signed)
Improved.  Continue amlodipine 10 mg daily. Will change prescription to reflect 10 mg dose.

## 2022-12-20 NOTE — Progress Notes (Signed)
Subjective:    Patient ID: Tina Wall, female    DOB: Nov 08, 1980, 42 y.o.   MRN: [id number]  HPI  Dalaya Suppa is a very pleasant 42 y.o. female with a history of hypertension, asthma, GERD, prediabetes, anxiety and depression who presents today to discuss obesity and prediabetes and hypertension.  1) Prediabetes: Chronic history of prediabetes dating back at least 7 years prior. Last A1C in August 2023 was 6.4 which was an increase from 5.7 six years prior.   Today her A1C is 5.9  Diet currently consists of:  Breakfast: Boiled egg, Fast food Lunch: Skips Dinner: Fast food, take out food.  Snacks: Not usually  Desserts: Several times weekly  Beverages: Regular soda or water. Has tried to increase water intake.   Exercise: Active at work. Does walk at times, tries to get in 4 miles at a time.   2) Hypertension: Currently managed on amlodipine 5 mg. Over the last few weeks she's been taking amlodipine 10 mg (2 of the 5 mg tabs) for elevated BP readings.   Wt Readings from Last 3 Encounters:  12/20/22 229 lb (103.9 kg)  06/26/22 230 lb (104.3 kg)  04/19/22 230 lb 6.4 oz (104.5 kg)   Body mass index is 44.72 kg/m.  BP Readings from Last 3 Encounters:  12/20/22 134/80  06/26/22 (!) 145/77  04/19/22 138/80       Review of Systems  Eyes:  Negative for visual disturbance.  Cardiovascular:  Negative for chest pain.  Neurological:  Negative for dizziness and numbness.         Past Medical History:  Diagnosis Date   Asthma    Bronchitis    Diabetes mellitus without complication (HCC)    Gestational diabetes    GI bleeding    Headache    Heart murmur    History of gestational diabetes 05/27/2016   Dx @ 9wks    A1C: 5.7  Glyburide 5mg  BID    2hr pp GTT: 105/170/144   A1C:___   HSV-2 (herpes simplex virus 2) infection    Hx of chlamydia infection    Hx of trichomoniasis    Hypertension    Ovarian cyst    Previous cesarean section 05/23/2016   Vag then c/s x 3   1st: FTP @ 6cm, 2nd & 3rd: repeat      Syphilis     Social History   Socioeconomic History   Marital status: Unknown    Spouse name: Not on file   Number of children: Not on file   Years of education: Not on file   Highest education level: Not on file  Occupational History   Not on file  Tobacco Use   Smoking status: Never   Smokeless tobacco: Never  Vaping Use   Vaping Use: Never used  Substance and Sexual Activity   Alcohol use: Yes    Comment: occ   Drug use: Never   Sexual activity: Not Currently    Partners: Male    Birth control/protection: None  Other Topics Concern   Not on file  Social History Narrative   ** Merged History Encounter **       Social Determinants of Health   Financial Resource Strain: Not on file  Food Insecurity: Not on file  Transportation Needs: Not on file  Physical Activity: Not on file  Stress: Not on file  Social Connections: Not on file  Intimate Partner Violence: Not on file    Past Surgical History:  Procedure Laterality Date   CESAREAN SECTION     C/S x 2   CESAREAN SECTION N/A 12/19/2016   Procedure: REPEAT CESAREAN SECTION;  Surgeon: Lazaro Arms, MD;  Location: [facility];  Service: Obstetrics;  Laterality: N/A;   COSMETIC SURGERY     FOOT SURGERY     LIPOSUCTION     TONSILLECTOMY     WISDOM TOOTH EXTRACTION      Family History  Problem Relation Age of Onset   Hypertension Mother    Diabetes Mother    Kidney disease Mother    Cancer Father        trachea    Breast cancer Maternal Aunt    Alzheimer's disease Paternal Grandmother     Allergies  Allergen Reactions   Ace Inhibitors Shortness Of Breath    Short of breath   Ace Inhibitors    Latex Other (See Comments)    Causes irritation.   Latex     Current Outpatient Medications on File Prior to Visit  Medication Sig Dispense Refill   albuterol (PROAIR HFA) 108 (90 Base) MCG/ACT inhaler Inhale 2 puffs into the lungs every 4 (four) hours as needed  for wheezing or shortness of breath. Reported on 10/08/2015 1 Inhaler 3   No current facility-administered medications on file prior to visit.    BP 134/80   Pulse 75   Temp 98.4 F (36.9 C) (Temporal)   Ht 5' (1.524 m)   Wt 229 lb (103.9 kg)   SpO2 98%   BMI 44.72 kg/m  Objective:   Physical Exam Cardiovascular:     Rate and Rhythm: Normal rate and regular rhythm.  Pulmonary:     Effort: Pulmonary effort is normal.     Breath sounds: Normal breath sounds.  Musculoskeletal:     Cervical back: Neck supple.  Skin:    General: Skin is warm and dry.           Assessment & Plan:  Borderline diabetes Assessment & Plan: Improved with A1C of 5.9 today.  Long discussion regarding her diet and the need for exercise. Referral placed for [facility] Well[facility] placed.   Continue to monitor.    Orders: -     POCT glycosylated hemoglobin (Hb A1C)  Essential hypertension Assessment & Plan: Improved.  Continue amlodipine 10 mg daily. Will change prescription to reflect 10 mg dose.   Orders: -     amLODIPine Besylate; Take 1 tablet (10 mg total) by mouth daily. for blood pressure.  Dispense: 90 tablet; Refill: 0  Gastroesophageal reflux disease, unspecified whether esophagitis present Assessment & Plan: Controlled.  Continue to monitor. Remain off treatment.   Morbid obesity with BMI of 40.0-44.9, adult (HCC) -     Amb Ref to Medical Weight Management        Doreene Nest, NP

## 2023-01-18 ENCOUNTER — Encounter (INDEPENDENT_AMBULATORY_CARE_PROVIDER_SITE_OTHER): Payer: Self-pay | Admitting: Family Medicine

## 2023-01-18 DIAGNOSIS — R7303 Prediabetes: Secondary | ICD-10-CM | POA: Insufficient documentation

## 2023-01-18 NOTE — Progress Notes (Incomplete)
Carlye Grippe, DO, ABFM, ABOM Department of Obesity Medicine  [facility] [address] [place], [place], [place] [address] Office: [phone]  /  Fax: [phone]    Initial Visit  Tina Wall was seen in clinic today to evaluate for obesity. She is interested in losing weight to improve overall health and reduce the risk of weight related complications. She presents today to review program treatment options, initial physical assessment, and evaluation.     She was referred by: {emreferby:28303}  When asked what else they would they like to accomplish? She states: {EMHopetoaccomplish:28304}  When asked how has your weight affected you? She states: {EMWeightAffected:28305}  Some associated conditions: {EMSomeConditions:28306}  Contributing factors: {EMcontributingfactors:28307}  Weight promoting medications identified: {EMWeightpromotingrx:28308}  Current nutrition plan: {EMNutritionplan:28309::"None"}  Current level of physical activity: {EMcurrentPA:28310::"None"}  Current or previous pharmacotherapy: {EM previousRx:28311}  Response to medication: {EMResponsetomedication:28312}   Past medical history includes:   Past Medical History:  Diagnosis Date   Asthma    Bronchitis    Diabetes mellitus without complication (HCC)    Gestational diabetes    GI bleeding    Headache    Heart murmur    History of gestational diabetes 05/27/2016   Dx @ 9wks    A1C: 5.7  Glyburide 5mg  BID    2hr pp GTT: 105/170/144   A1C:___   HSV-2 (herpes simplex virus 2) infection    Hx of chlamydia infection    Hx of trichomoniasis    Hypertension    Ovarian cyst    Previous cesarean section 05/23/2016   Vag then c/s x 3  1st: FTP @ 6cm, 2nd & 3rd: repeat      Syphilis     Reviewed by clinician on day of visit: allergies, medications, problem list, medical history, surgical history, family history, social history, and previous encounter notes pertinent to  obesity diagnosis.  Objective:   There were no vitals taken for this visit. She was weighed on the bioimpedance scale: There is no height or weight on file to calculate BMI.  Visceral Fat %:***, Body Fat %:***   General: Well Developed, well nourished, and in no acute distress.  HEENT: Normocephalic, atraumatic Skin: Warm and dry, cap RF less 2 sec, good turgor Chest:  Normal excursion, shape, no gross abn Respiratory: speaking in full sentences, no conversational dyspnea NeuroM-Sk: Ambulates w/o assistance, moves * 4 Psych: A and O *3, insight good, mood-full   Assessment and Plan:   Prediabetes ***  Essential hypertension ***  Morbid obesity with BMI of 40.0-44.9, adult (HCC) ***      Obesity Treatment Plan: She will work on gathering support from family and friends to begin weight loss journey. Work on eliminating or reducing the presence of highly processed, calorie dense foods in the home. Complete provided nutritional and psychosocial assessment questionnaire prior to her next visit.  Assess for cardiometabolic complications and nutritional deficiencies via fasting serologies.  Assess REE via indirect calorimetry to guide the creation of a reduced calorie, high protein meal plan to promote loss of fat mass.   Deshon Hovsepian will follow up in the next 1-2 weeks to review the above steps and continue evaluation and treatment of their disease of obesity  Obesity Education Performed Today: She was weighed on the bioimpedance scale and results were discussed and documented above  We discussed obesity as a disease and the importance of a more detailed evaluation of all the factors contributing to the disease.  We discussed  the importance of long term lifestyle changes which include nutrition, exercise and behavioral modifications as well as the importance of customizing this to her specific health and social needs.  We discussed the benefits of reaching a healthier weight to  alleviate the symptoms of existing conditions and reduce the risks of the biomechanical, metabolic and psychological effects of obesity.   Lakshya Chhoeun appears to be in the action stage of change and states they are ready to start intensive lifestyle modifications and behavioral modifications.   *** minutes was spent today on this visit including the above counseling, pre-visit chart review, and post-visit documentation.  ***

## 2023-04-06 ENCOUNTER — Encounter (INDEPENDENT_AMBULATORY_CARE_PROVIDER_SITE_OTHER): Payer: Self-pay

## 2023-04-21 ENCOUNTER — Encounter: Payer: Self-pay | Admitting: Primary Care

## 2023-04-26 ENCOUNTER — Encounter: Payer: Self-pay | Admitting: Primary Care

## 2023-05-03 ENCOUNTER — Other Ambulatory Visit: Payer: Self-pay | Admitting: Primary Care

## 2023-05-03 DIAGNOSIS — I10 Essential (primary) hypertension: Secondary | ICD-10-CM

## 2023-05-09 ENCOUNTER — Encounter: Payer: Self-pay | Admitting: Primary Care

## 2023-05-11 ENCOUNTER — Ambulatory Visit (INDEPENDENT_AMBULATORY_CARE_PROVIDER_SITE_OTHER): Payer: Self-pay | Admitting: Primary Care

## 2023-05-11 ENCOUNTER — Encounter: Payer: Self-pay | Admitting: Primary Care

## 2023-05-11 VITALS — BP 124/70 | HR 80 | Temp 97.3°F | Ht 60.0 in | Wt 230.0 lb

## 2023-05-11 DIAGNOSIS — I10 Essential (primary) hypertension: Secondary | ICD-10-CM

## 2023-05-11 DIAGNOSIS — F32A Depression, unspecified: Secondary | ICD-10-CM

## 2023-05-11 DIAGNOSIS — K219 Gastro-esophageal reflux disease without esophagitis: Secondary | ICD-10-CM

## 2023-05-11 DIAGNOSIS — R7303 Prediabetes: Secondary | ICD-10-CM

## 2023-05-11 DIAGNOSIS — J4520 Mild intermittent asthma, uncomplicated: Secondary | ICD-10-CM

## 2023-05-11 DIAGNOSIS — M779 Enthesopathy, unspecified: Secondary | ICD-10-CM

## 2023-05-11 DIAGNOSIS — Z23 Encounter for immunization: Secondary | ICD-10-CM

## 2023-05-11 DIAGNOSIS — Z0001 Encounter for general adult medical examination with abnormal findings: Secondary | ICD-10-CM

## 2023-05-11 DIAGNOSIS — F419 Anxiety disorder, unspecified: Secondary | ICD-10-CM

## 2023-05-11 LAB — BASIC METABOLIC PANEL
BUN: 9 mg/dL (ref 6–23)
CO2: 27 mEq/L (ref 19–32)
Calcium: 9.3 mg/dL (ref 8.4–10.5)
Chloride: 101 mEq/L (ref 96–112)
Creatinine, Ser: 0.82 mg/dL (ref 0.40–1.20)
GFR: 88.77 mL/min (ref >60.00)
Glucose, Bld: 121 mg/dL — ABNORMAL HIGH (ref 70–99)
Potassium: 3.9 mEq/L (ref 3.5–5.1)
Sodium: 138 mEq/L (ref 135–145)

## 2023-05-11 LAB — LIPID PANEL
Cholesterol: 226 mg/dL — ABNORMAL HIGH (ref 0–200)
HDL: 53.3 mg/dL (ref >39.00)
LDL Cholesterol: 155 mg/dL — ABNORMAL HIGH (ref 0–99)
NonHDL: 172.52
Total CHOL/HDL Ratio: 4
Triglycerides: 86 mg/dL (ref 0.0–149.0)
VLDL: 17.2 mg/dL (ref 0.0–40.0)

## 2023-05-11 LAB — HEMOGLOBIN A1C: Hgb A1c MFr Bld: 6.3 % (ref 4.6–6.5)

## 2023-05-11 LAB — TSH: TSH: 1.21 u[IU]/mL (ref 0.35–5.50)

## 2023-05-11 LAB — VITAMIN B12: Vitamin B-12: 589 pg/mL (ref 211–911)

## 2023-05-11 LAB — VITAMIN D 25 HYDROXY (VIT D DEFICIENCY, FRACTURES): VITD: 27.98 ng/mL — ABNORMAL LOW (ref 30.00–100.00)

## 2023-05-11 MED ORDER — OMEPRAZOLE 20 MG PO CPDR
20.0000 mg | DELAYED_RELEASE_CAPSULE | Freq: Every day | ORAL | 1 refills | Status: DC
Start: 2023-05-11 — End: 2024-05-10

## 2023-05-11 NOTE — Assessment & Plan Note (Signed)
Controlled.  Continue amlodipine 10 mg daily.

## 2023-05-11 NOTE — Assessment & Plan Note (Signed)
Overall controlled.  Continue albuterol inhaler PRN.

## 2023-05-11 NOTE — Assessment & Plan Note (Signed)
Deteriorated.  Discussed options for treatment as she was previously managed on Zoloft. She opts for therapy, referral placed.

## 2023-05-11 NOTE — Patient Instructions (Signed)
You can resume omeprazole 20 mg daily for the cough and heartburn.  You will either be contacted via phone regarding your referral to therapy, or you may receive a letter on your MyChart portal from our referral team with instructions for scheduling an appointment. Please let us know if you have not been contacted by anyone within two weeks.  Stop by the lab prior to leaving today. I will notify you of your results once received.   It was a pleasure to see you today!

## 2023-05-11 NOTE — Assessment & Plan Note (Signed)
Intermittent.  Recommended sports medicine evaluation. She will schedule an appointment if the symptoms return.

## 2023-05-11 NOTE — Assessment & Plan Note (Signed)
Immunizations UTD. Influenza vaccine provided today. Pap smear UTD. Follows with GYN Mammogram UTD  Discussed the importance of a healthy diet and regular exercise in order for weight loss, and to reduce the risk of further co-morbidity.  Exam stable. Labs pending.  Follow up in 1 year for repeat physical.

## 2023-05-11 NOTE — Assessment & Plan Note (Signed)
Waxes and wanes, recent flares now. Refill provided for omeprazole 20 mg daily.

## 2023-05-11 NOTE — Assessment & Plan Note (Signed)
Repeat A1C pending.  Discussed the importance of a healthy diet and regular exercise in order for weight loss, and to reduce the risk of further co-morbidity.

## 2023-05-11 NOTE — Progress Notes (Signed)
Subjective:    Patient ID: Tina Wall, female    DOB: 01-22-1981, 42 y.o.   MRN: [id number]  HPI  Tina Wall is a very pleasant 42 y.o. female who presents today for complete physical and follow up of chronic conditions.  She would also like to discuss little motivation to do anything. Also with not wanting to do anything, sleeping more frequently than usual, spends all day in the bed. This began in early 2024. Yesterday she realized that she doesn't enjoy going to work, has been working one day weekly on average, has been getting behind on her credit card bills. She typically works 4-5 days weekly. Her husband and friends have noticed this as well, and her friends mention that she speaks negatively of herself. Last year she was referred to therapy but they did not take Medicaid. She was previously managed on Zoloft, but isn't sure if it was effective as she didn't take it long.   Immunizations: -Tetanus: Completed in 2017 -Influenza:   Diet: Fair diet.  Exercise: No regular exercise.   Eye exam: Completed years ago. No concerns.  Dental exam: Completed 6 years ago.  Pap Smear: August 2023 Mammogram: March 2024  BP Readings from Last 3 Encounters:  05/11/23 124/70  12/20/22 134/80  06/26/22 (!) 145/77      Review of Systems  Constitutional:  Negative for unexpected weight change.  HENT:  Negative for rhinorrhea.   Respiratory:  Negative for cough and shortness of breath.   Cardiovascular:  Negative for chest pain.  Gastrointestinal:  Negative for constipation and diarrhea.  Genitourinary:  Negative for difficulty urinating and menstrual problem.  Musculoskeletal:  Negative for arthralgias and myalgias.  Skin:  Negative for rash.  Allergic/Immunologic: Negative for environmental allergies.  Neurological:  Negative for dizziness and headaches.  Psychiatric/Behavioral:         Depression, see HPI         Past Medical History:  Diagnosis Date   Asthma     Bronchitis    Diabetes mellitus without complication (HCC)    Gestational diabetes    GI bleeding    Headache    Heart murmur    History of gestational diabetes 05/27/2016   Dx @ 9wks    A1C: 5.7  Glyburide 5mg  BID    2hr pp GTT: 105/170/144   A1C:___   HSV-2 (herpes simplex virus 2) infection    Hx of chlamydia infection    Hx of trichomoniasis    Hypertension    Ovarian cyst    Previous cesarean section 05/23/2016   Vag then c/s x 3  1st: FTP @ 6cm, 2nd & 3rd: repeat      Syphilis     Social History   Socioeconomic History   Marital status: Unknown    Spouse name: Not on file   Number of children: Not on file   Years of education: Not on file   Highest education level: Not on file  Occupational History   Not on file  Tobacco Use   Smoking status: Never   Smokeless tobacco: Never  Vaping Use   Vaping status: Never Used  Substance and Sexual Activity   Alcohol use: Yes    Comment: occ   Drug use: Never   Sexual activity: Not Currently    Partners: Male    Birth control/protection: None  Other Topics Concern   Not on file  Social History Narrative   ** Merged History Encounter **  Social Determinants of Health   Financial Resource Strain: Not on file  Food Insecurity: Not on file  Transportation Needs: Not on file  Physical Activity: Not on file  Stress: Not on file  Social Connections: Not on file  Intimate Partner Violence: Not on file    Past Surgical History:  Procedure Laterality Date   CESAREAN SECTION     C/S x 2   CESAREAN SECTION N/A 12/19/2016   Procedure: REPEAT CESAREAN SECTION;  Surgeon: Lazaro Arms, MD;  Location: [facility];  Service: Obstetrics;  Laterality: N/A;   COSMETIC SURGERY     FOOT SURGERY     LIPOSUCTION     TONSILLECTOMY     WISDOM TOOTH EXTRACTION      Family History  Problem Relation Age of Onset   Hypertension Mother    Diabetes Mother    Kidney disease Mother    Cancer Father        trachea     Breast cancer Maternal Aunt    Alzheimer's disease Paternal Grandmother     Allergies  Allergen Reactions   Ace Inhibitors Shortness Of Breath    Short of breath   Ace Inhibitors    Latex Other (See Comments)    Causes irritation.   Latex     Current Outpatient Medications on File Prior to Visit  Medication Sig Dispense Refill   albuterol (PROAIR HFA) 108 (90 Base) MCG/ACT inhaler Inhale 2 puffs into the lungs every 4 (four) hours as needed for wheezing or shortness of breath. Reported on 10/08/2015 1 Inhaler 3   amLODipine (NORVASC) 10 MG tablet Take 1 tablet by mouth once daily for blood pressure 90 tablet 0   No current facility-administered medications on file prior to visit.    BP 124/70   Pulse 80   Temp (!) 97.3 F (36.3 C) (Temporal)   Ht 5' (1.524 m)   Wt 230 lb (104.3 kg)   SpO2 99%   BMI 44.92 kg/m  Objective:   Physical Exam HENT:     Right Ear: Tympanic membrane and ear canal normal.     Left Ear: Tympanic membrane and ear canal normal.     Nose: Nose normal.  Eyes:     Conjunctiva/sclera: Conjunctivae normal.     Pupils: Pupils are equal, round, and reactive to light.  Neck:     Thyroid: No thyromegaly.  Cardiovascular:     Rate and Rhythm: Normal rate and regular rhythm.     Heart sounds: No murmur heard. Pulmonary:     Effort: Pulmonary effort is normal.     Breath sounds: Normal breath sounds. No rales.  Abdominal:     General: Bowel sounds are normal.     Palpations: Abdomen is soft.     Tenderness: There is no abdominal tenderness.  Musculoskeletal:        General: Normal range of motion.     Cervical back: Neck supple.  Lymphadenopathy:     Cervical: No cervical adenopathy.  Skin:    General: Skin is warm and dry.     Findings: No rash.  Neurological:     Mental Status: She is alert and oriented to person, place, and time.     Cranial Nerves: No cranial nerve deficit.     Deep Tendon Reflexes: Reflexes are normal and symmetric.   Psychiatric:        Mood and Affect: Mood normal.  Assessment & Plan:  Encounter for annual general medical examination with abnormal findings in adult Assessment & Plan: Immunizations UTD. Influenza vaccine provided today. Pap smear UTD. Follows with GYN Mammogram UTD  Discussed the importance of a healthy diet and regular exercise in order for weight loss, and to reduce the risk of further co-morbidity.  Exam stable. Labs pending.  Follow up in 1 year for repeat physical.    Anxiety and depression Assessment & Plan: Deteriorated.  Discussed options for treatment as she was previously managed on Zoloft. She opts for therapy, referral placed.   Orders: -     Ambulatory referral to Psychology -     Vitamin B12 -     VITAMIN D 25 Hydroxy (Vit-D Deficiency, Fractures)  Essential hypertension Assessment & Plan: Controlled.  Continue amlodipine 10 mg daily.  Orders: -     Lipid panel -     Basic metabolic panel -     TSH  Borderline diabetes Assessment & Plan: Repeat A1C pending.  Discussed the importance of a healthy diet and regular exercise in order for weight loss, and to reduce the risk of further co-morbidity.   Orders: -     Lipid panel -     Hemoglobin A1c  Mild intermittent asthma without complication Assessment & Plan: Overall controlled.  Continue albuterol inhaler PRN.    Gastroesophageal reflux disease, unspecified whether esophagitis present Assessment & Plan: Waxes and wanes, recent flares now. Refill provided for omeprazole 20 mg daily.  Orders: -     Omeprazole; Take 1 capsule (20 mg total) by mouth daily. for heartburn.  Dispense: 90 capsule; Refill: 1  Tendonitis Assessment & Plan: Intermittent.  Recommended sports medicine evaluation. She will schedule an appointment if the symptoms return.   Encounter for immunization -     Flu vaccine trivalent PF, 6mos and  older(Flulaval,Afluria,Fluarix,Fluzone)        Doreene Nest, NP

## 2023-05-24 NOTE — Telephone Encounter (Signed)
Spoke to pt. Pt states she's never considered to try any weight loss meds due to not having any insurance & being self pay. Pt states she knows the cost would be something she couldn't afford. Call back # [phone]

## 2023-05-24 NOTE — Telephone Encounter (Signed)
Noted  

## 2023-07-29 ENCOUNTER — Encounter: Payer: Self-pay | Admitting: Emergency Medicine

## 2023-07-29 ENCOUNTER — Other Ambulatory Visit: Payer: Self-pay

## 2023-07-29 DIAGNOSIS — R220 Localized swelling, mass and lump, head: Secondary | ICD-10-CM | POA: Insufficient documentation

## 2023-07-29 NOTE — ED Triage Notes (Signed)
Patient c/o facial swelling to the left side of her forehead that she noticed on Thursday.  Patient reports it started hurting today.  Patient denies any new soaps/detergents/makeup.

## 2023-07-30 ENCOUNTER — Emergency Department
Admission: EM | Admit: 2023-07-30 | Discharge: 2023-07-30 | Disposition: A | Discharge status: Home / Self Care | Payer: Self-pay | Attending: Emergency Medicine | Admitting: Emergency Medicine

## 2023-07-30 ENCOUNTER — Emergency Department: Payer: Self-pay

## 2023-07-30 DIAGNOSIS — R220 Localized swelling, mass and lump, head: Secondary | ICD-10-CM

## 2023-07-30 LAB — BASIC METABOLIC PANEL
Anion gap: 9 (ref 5–15)
BUN: 13 mg/dL (ref 6–20)
CO2: 25 mmol/L (ref 22–32)
Calcium: 8.9 mg/dL (ref 8.9–10.3)
Chloride: 105 mmol/L (ref 98–111)
Creatinine, Ser: 0.89 mg/dL (ref 0.44–1.00)
GFR, Estimated: >60 mL/min (ref >60)
Glucose, Bld: 98 mg/dL (ref 70–99)
Potassium: 3.5 mmol/L (ref 3.5–5.1)
Sodium: 139 mmol/L (ref 135–145)

## 2023-07-30 LAB — CBC WITH DIFFERENTIAL/PLATELET
Abs Immature Granulocytes: 0.01 10*3/uL (ref 0.00–0.07)
Basophils Absolute: 0.1 10*3/uL (ref 0.0–0.1)
Basophils Relative: 1 %
Eosinophils Absolute: 0.3 10*3/uL (ref 0.0–0.5)
Eosinophils Relative: 6 %
HCT: 36.7 % (ref 36.0–46.0)
Hemoglobin: 12 g/dL (ref 12.0–15.0)
Immature Granulocytes: 0 %
Lymphocytes Relative: 55 %
Lymphs Abs: 3 10*3/uL (ref 0.7–4.0)
MCH: 29.6 pg (ref 26.0–34.0)
MCHC: 32.7 g/dL (ref 30.0–36.0)
MCV: 90.6 fL (ref 80.0–100.0)
Monocytes Absolute: 0.4 10*3/uL (ref 0.1–1.0)
Monocytes Relative: 8 %
Neutro Abs: 1.6 10*3/uL — ABNORMAL LOW (ref 1.7–7.7)
Neutrophils Relative %: 30 %
Platelets: 278 10*3/uL (ref 150–400)
RBC: 4.05 MIL/uL (ref 3.87–5.11)
RDW: 12.7 % (ref 11.5–15.5)
WBC: 5.3 10*3/uL (ref 4.0–10.5)
nRBC: 0 % (ref 0.0–0.2)

## 2023-07-30 MED ORDER — IOHEXOL 300 MG/ML  SOLN
75.0000 mL | Freq: Once | INTRAMUSCULAR | Status: AC | PRN
  Administered 2023-07-30: 75 mL via INTRAVENOUS

## 2023-07-30 NOTE — ED Provider Notes (Signed)
[facility] Provider Note    Event Date/Time   First MD Initiated Contact with Patient 07/30/23 0030     (approximate)   History   Facial Swelling   HPI Tina Wall is a 42 y.o. female who presents for evaluation of swelling to the left side of her face and head.  She said that she first noticed the swelling about 4 days ago around her left temple.  It seems to be getting bigger and incorporating the left side of her forehead and around the temple and then is also radiating backward towards her scalp.  As of today it started to be a dull aching pain as well.  No redness, no heat when she touches it.  No recent injury, not even a minor contusion to the side of the face and head.  No visual changes.     Physical Exam   Triage Vital Signs: ED Triage Vitals [07/29/23 2154]  Encounter Vitals Group     BP (!) 162/96     Systolic BP Percentile      Diastolic BP Percentile      Pulse Rate 89     Resp 18     Temp 97.9 F (36.6 C)     Temp Source Oral     SpO2 99 %     Weight 103 kg (227 lb)     Height 1.549 m (5\' 1" )     Head Circumference      Peak Flow      Pain Score 5     Pain Loc      Pain Education      Exclude from Growth Chart     Most recent vital signs: Vitals:   07/29/23 2154 07/30/23 0129  BP: (!) 162/96 128/70  Pulse: 89 83  Resp: 18 16  Temp: 97.9 F (36.6 C)   SpO2: 99% 97%    General: Awake, no distress.  Head:  Patient has obvious swelling around the left temple, left side of forehead, and it seems to be extending back into her hairline.  It is soft and easily palpable, not necessarily boggy, just soft, not tense, not indurated.  No surrounding cellulitis.  Not significantly tender or painful.  No obvious signs of folliculitis. CV:  Good peripheral perfusion.  Resp:  Normal effort. Speaking easily and comfortably, no accessory muscle usage nor intercostal retractions.   Abd:  No distention.    ED Results / Procedures /  Treatments   Labs (all labs ordered are listed, but only abnormal results are displayed) Labs Reviewed  CBC WITH DIFFERENTIAL/PLATELET - Abnormal; Notable for the following components:      Result Value   Neutro Abs 1.6 (*)    All other components within normal limits  BASIC METABOLIC PANEL     RADIOLOGY I viewed and interpreted the patient's CT maxillofacial.  See hospital course for details, but in short, no acute/emergent abnormalities.   PROCEDURES:  Critical Care performed: No  Procedures    IMPRESSION / MDM / ASSESSMENT AND PLAN / ED COURSE  I reviewed the triage vital signs and the nursing notes.                              Differential diagnosis includes, but is not limited to, hematoma, fungal infection, nonspecific soft tissue edema.  Patient's presentation is most consistent with acute presentation with potential threat to life or bodily  function.  Labs/studies ordered: BMP, CBC with differential, CT maxillofacial  Interventions/Medications given:  Medications  iohexol (OMNIPAQUE) 300 MG/ML solution 75 mL (75 mLs Intravenous Contrast Given 07/30/23 0205)    (Note:  hospital course my include additional interventions and/or labs/studies not listed above.)   Vital signs are stable and within normal limits.  CBC and BMP are normal.  Does not appear infectious, but I have no specific explanation for the edema to the side of her face and head and it is unclear how far back into her scalp that extends.  No known recent trauma.  I discussed it with the patient and she would prefer to obtain imaging to try to get a better look, even if it is inconclusive.  As result, I ordered CT maxillofacial with IV contrast to better evaluate the swelling to determine if it appears more consistent with fluid, abscess, blood, etc.     Clinical Course as of 07/30/23 0250  Sun Jul 30, 2023  0248 CT Maxillofacial W Contrast I viewed and interpreted the patient's CT  maxillofacial, and I cannot even identify the swelling that I can see on physical exam.  The radiologist also identified no abnormalities and summarized as a "normal maxillofacial CT".  I updated the patient about her reassuring results and she is comfortable with the plan for discharge and outpatient follow-up.  I gave my usual and customary return precautions and follow-up recommendations. [CF]    Clinical Course User Index [CF] Loleta Rose, MD     FINAL CLINICAL IMPRESSION(S) / ED DIAGNOSES   Final diagnoses:  Facial swelling     Rx / DC Orders   ED Discharge Orders     None        Note:  This document was prepared using Dragon voice recognition software and may include unintentional dictation errors.   Loleta Rose, MD 07/30/23 [phone]

## 2023-07-30 NOTE — Discharge Instructions (Signed)
Your workup in the Emergency Department today was reassuring.  We did not find any specific abnormalities.  We recommend you drink plenty of fluids, take your regular medications and/or any new ones prescribed today, and follow up with the doctor(s) listed in these documents as recommended.  Return to the Emergency Department if you develop new or worsening symptoms that concern you.  

## 2023-08-04 ENCOUNTER — Telehealth: Payer: Self-pay

## 2023-08-04 NOTE — Transitions of Care (Post Inpatient/ED Visit) (Signed)
Unable to reach patient by phone and left v/m requesting call back at [phone].       08/04/2023  Name: Tina Wall MRN: [id number] DOB: [birth year]  Today's TOC FU Call Status: Today's TOC FU Call Status:: Unsuccessful Call (1st Attempt) Unsuccessful Call (1st Attempt) Date: 08/04/23  Attempted to reach the patient regarding the most recent Inpatient/ED visit.  Follow Up Plan: Additional outreach attempts will be made to reach the patient to complete the Transitions of Care (Post Inpatient/ED visit) call.   Signature Lewanda Rife, LPN

## 2023-08-20 ENCOUNTER — Other Ambulatory Visit: Payer: Self-pay | Admitting: Primary Care

## 2023-08-20 DIAGNOSIS — I10 Essential (primary) hypertension: Secondary | ICD-10-CM

## 2024-05-10 ENCOUNTER — Ambulatory Visit (INDEPENDENT_AMBULATORY_CARE_PROVIDER_SITE_OTHER)
Admission: RE | Admit: 2024-05-10 | Discharge: 2024-05-10 | Disposition: A | Discharge status: Home / Self Care | Payer: Self-pay | Source: Ambulatory Visit | Attending: Primary Care | Admitting: Primary Care

## 2024-05-10 ENCOUNTER — Ambulatory Visit (INDEPENDENT_AMBULATORY_CARE_PROVIDER_SITE_OTHER): Payer: Self-pay | Admitting: Primary Care

## 2024-05-10 ENCOUNTER — Encounter: Payer: Self-pay | Admitting: Primary Care

## 2024-05-10 ENCOUNTER — Ambulatory Visit: Payer: Self-pay | Admitting: Primary Care

## 2024-05-10 VITALS — BP 136/84 | HR 56 | Temp 97.2°F | Ht 61.0 in | Wt 230.0 lb

## 2024-05-10 DIAGNOSIS — E1165 Type 2 diabetes mellitus with hyperglycemia: Secondary | ICD-10-CM

## 2024-05-10 DIAGNOSIS — Z23 Encounter for immunization: Secondary | ICD-10-CM

## 2024-05-10 DIAGNOSIS — M5412 Radiculopathy, cervical region: Secondary | ICD-10-CM

## 2024-05-10 DIAGNOSIS — M542 Cervicalgia: Secondary | ICD-10-CM

## 2024-05-10 DIAGNOSIS — F32A Depression, unspecified: Secondary | ICD-10-CM

## 2024-05-10 DIAGNOSIS — G8929 Other chronic pain: Secondary | ICD-10-CM

## 2024-05-10 DIAGNOSIS — I10 Essential (primary) hypertension: Secondary | ICD-10-CM

## 2024-05-10 DIAGNOSIS — R7303 Prediabetes: Secondary | ICD-10-CM

## 2024-05-10 DIAGNOSIS — B009 Herpesviral infection, unspecified: Secondary | ICD-10-CM

## 2024-05-10 DIAGNOSIS — J4520 Mild intermittent asthma, uncomplicated: Secondary | ICD-10-CM

## 2024-05-10 DIAGNOSIS — Z8619 Personal history of other infectious and parasitic diseases: Secondary | ICD-10-CM

## 2024-05-10 DIAGNOSIS — Z1231 Encounter for screening mammogram for malignant neoplasm of breast: Secondary | ICD-10-CM

## 2024-05-10 DIAGNOSIS — Z0001 Encounter for general adult medical examination with abnormal findings: Secondary | ICD-10-CM

## 2024-05-10 DIAGNOSIS — K219 Gastro-esophageal reflux disease without esophagitis: Secondary | ICD-10-CM

## 2024-05-10 DIAGNOSIS — F419 Anxiety disorder, unspecified: Secondary | ICD-10-CM

## 2024-05-10 LAB — HEMOGLOBIN A1C: Hgb A1c MFr Bld: 6.5 % (ref 4.6–6.5)

## 2024-05-10 MED ORDER — ALBUTEROL SULFATE HFA 108 (90 BASE) MCG/ACT IN AERS: 2.0000 | INHALATION_SPRAY | Freq: Four times a day (QID) | RESPIRATORY_TRACT | 0 refills | Status: AC | PRN

## 2024-05-10 MED ORDER — CYCLOBENZAPRINE HCL 5 MG PO TABS: 5.0000 mg | ORAL_TABLET | Freq: Three times a day (TID) | ORAL | 0 refills | Status: AC | PRN

## 2024-05-10 NOTE — Assessment & Plan Note (Signed)
 Stable overall. Commended her on regular exercise.  Continue amlodipine  10 mg daily.

## 2024-05-10 NOTE — Assessment & Plan Note (Signed)
 Following with bariatric surgery, office notes and labs reviewed from September 2025 through [facility].

## 2024-05-10 NOTE — Patient Instructions (Addendum)
 Stop by the lab and xray prior to leaving today. I will notify you of your results once received.   You may take the cyclobenzaprine  muscle relaxer every 8 hours as needed.  Be careful as this may make you drowsy.  [facility] to [facility] your [facility].   It was a pleasure to see you today!

## 2024-05-10 NOTE — Assessment & Plan Note (Signed)
 Immunizations UTD. Influenza vaccine provided today.  Pap smear UTD. Mammogram due, orders placed.  Discussed the importance of a healthy diet and regular exercise in order for weight loss, and to reduce the risk of further co-morbidity.  Exam stable. Labs pending.  Follow up in 1 year for repeat physical.

## 2024-05-10 NOTE — Progress Notes (Signed)
 Subjective:    Patient ID: Shauniece Wall, female    DOB: 02-Dec-1980, 43 y.o.   MRN: [id number]  Tina Wall is a very pleasant 43 y.o. female who presents today for complete physical and follow up of chronic conditions.  She would also like to discuss chronic right neck pain. Her pain is located to the right posterior neck with radiation down to her right shoulder. Initially began in June 2025, resolved. Her symptoms returned three days ago. She's had a stressful week this week. She's been taking Tylenol  with temporary improvement. She denies numbness, trauma.   She would also like her syphilis titers rechecked.  Immunizations: -Tetanus: Completed in 2017 -Influenza: Influenza vaccine provided today.   Diet: Fair diet.  Exercise: No regular exercise.  Eye exam: Completed >1 year ago Dental exam: Completes semi-annually    Pap Smear: Completed in August 2023 Mammogram: Completed in March 2024   Wt Readings from Last 3 Encounters:  05/10/24 230 lb (104.3 kg)  07/29/23 227 lb (103 kg)  05/11/23 230 lb (104.3 kg)   BP Readings from Last 3 Encounters:  05/10/24 136/84  07/30/23 (!) 117/59  05/11/23 124/70      Review of Systems  Constitutional:  Negative for unexpected weight change.  HENT:  Negative for rhinorrhea.   Respiratory:  Negative for cough and shortness of breath.        Wheezes when she eats shrimp.  Cardiovascular:  Negative for chest pain.  Gastrointestinal:  Negative for constipation and diarrhea.  Genitourinary:  Negative for difficulty urinating.  Musculoskeletal:  Positive for myalgias and neck pain. Negative for arthralgias.  Skin:  Negative for rash.  Allergic/Immunologic: Negative for environmental allergies.  Neurological:  Negative for dizziness, numbness and headaches.  Psychiatric/Behavioral:  The patient is not nervous/anxious.          Past Medical History:  Diagnosis Date   Asthma    Bronchitis    Diabetes mellitus without  complication (HCC)    Fibroid 05/23/2016   Lt subserosal 2.4x2.3x2.5 @ [redacted]wks     Gestational diabetes    GI bleeding    Headache    Heart murmur    History of gestational diabetes 05/27/2016   Dx @ 9wks    A1C: 5.7  Glyburide  5mg  BID    2hr pp GTT: 105/170/144   A1C:___   HSV-2 (herpes simplex virus 2) infection    Hx of chlamydia infection    Hx of trichomoniasis    Hypertension    Ovarian cyst    Persistent cough for 3 weeks or longer 01/07/2022   Previous cesarean section 05/23/2016   Vag then c/s x 3  1st: FTP @ 6cm, 2nd & 3rd: repeat      Syphilis    Tendonitis 04/19/2022    Social History   Socioeconomic History   Marital status: Unknown    Spouse name: Not on file   Number of children: Not on file   Years of education: Not on file   Highest education level: Not on file  Occupational History   Not on file  Tobacco Use   Smoking status: Never   Smokeless tobacco: Never  Vaping Use   Vaping status: Never Used  Substance and Sexual Activity   Alcohol use: Yes    Comment: occ   Drug use: Never   Sexual activity: Not Currently    Partners: Male    Birth control/protection: None  Other Topics Concern   Not on file  Social History Narrative   ** Merged History Encounter **       Social Drivers of Health   Financial Resource Strain: Medium Risk (01/17/2024)   Received from [facility]   Overall Financial Resource Strain (CARDIA)    Difficulty of Paying Living Expenses: Somewhat hard  Food Insecurity: Food Insecurity Present (01/17/2024)   Received from [facility]   Hunger Vital Sign    Within the past 12 months, you worried that your food would run out before you got the money to buy more.: Sometimes true    Within the past 12 months, the food you bought just didn't last and you didn't have money to get more.: Sometimes true  Transportation Needs: No Transportation Needs (01/17/2024)   Received from [facility]   PRAPARE - Transportation    Lack of  Transportation (Medical): No    Lack of Transportation (Non-Medical): No  Physical Activity: Insufficiently Active (01/17/2024)   Received from [facility]   Exercise Vital Sign    On average, how many days per week do you engage in moderate to strenuous exercise (like a brisk walk)?: 2 days    On average, how many minutes do you engage in exercise at this level?: 60 min  Stress: Stress Concern Present (01/17/2024)   Received from [facility] - Occupational Stress Questionnaire    Feeling of Stress : To some extent  Social Connections: Somewhat Isolated (01/17/2024)   Received from [facility]   Social Network    How would you rate your social network (family, work, friends)?: Restricted participation with some degree of social isolation  Intimate Partner Violence: Not At Risk (01/17/2024)   Received from [facility]   HITS    Over the last 12 months how often did your partner physically hurt you?: Never    Over the last 12 months how often did your partner insult you or talk down to you?: Sometimes    Over the last 12 months how often did your partner threaten you with physical harm?: Never    Over the last 12 months how often did your partner scream or curse at you?: Rarely    Past Surgical History:  Procedure Laterality Date   CESAREAN SECTION     C/S x 2   CESAREAN SECTION N/A 12/19/2016   Procedure: REPEAT CESAREAN SECTION;  Surgeon: Vonn VEAR Inch, MD;  Location: [facility] BIRTHING SUITES;  Service: Obstetrics;  Laterality: N/A;   COSMETIC SURGERY     FOOT SURGERY     LIPOSUCTION     TONSILLECTOMY     WISDOM TOOTH EXTRACTION      Family History  Problem Relation Age of Onset   Hypertension Mother    Diabetes Mother    Kidney disease Mother    Cancer Father        trachea    Breast cancer Maternal Aunt    Alzheimer's disease Paternal Grandmother     Allergies  Allergen Reactions   Ace Inhibitors Shortness Of Breath    Short  of breath   Ace Inhibitors    Latex Other (See Comments)    Causes irritation.   Latex     Current Outpatient Medications on File Prior to Visit  Medication Sig Dispense Refill   amLODipine  (NORVASC ) 10 MG tablet Take 1 tablet by mouth once daily for blood pressure 90 tablet 1   ascorbic acid (VITAMIN C) 1000 MG tablet Take 1,000 mg by  mouth.     aspirin  EC 81 MG tablet Take 81 mg by mouth.     Cholecalciferol 1.25 MG (50000 UT) TABS Take 2,000 Units by mouth.     levonorgestrel  (MIRENA ) 20 MCG/DAY IUD 1 each by Intrauterine route.     pravastatin (PRAVACHOL) 40 MG tablet Take 40 mg by mouth.     No current facility-administered medications on file prior to visit.    BP 136/84   Pulse (!) 56   Temp (!) 97.2 F (36.2 C) (Temporal)   Ht 5' 1 (1.549 m)   Wt 230 lb (104.3 kg)   SpO2 100%   BMI 43.46 kg/m  Objective:   Physical Exam HENT:     Right Ear: Tympanic membrane and ear canal normal.     Left Ear: Tympanic membrane and ear canal normal.  Eyes:     Pupils: Pupils are equal, round, and reactive to light.  Cardiovascular:     Rate and Rhythm: Normal rate and regular rhythm.  Pulmonary:     Effort: Pulmonary effort is normal.     Breath sounds: Normal breath sounds.  Abdominal:     General: Bowel sounds are normal.     Palpations: Abdomen is soft.     Tenderness: There is no abdominal tenderness.  Musculoskeletal:        General: Normal range of motion.     Cervical back: Neck supple. Pain with movement present. No spinous process tenderness or muscular tenderness. Normal range of motion.  Skin:    General: Skin is warm and dry.  Neurological:     Mental Status: She is alert and oriented to person, place, and time.     Cranial Nerves: No cranial nerve deficit.     Deep Tendon Reflexes:     Reflex Scores:      Patellar reflexes are 2+ on the right side and 2+ on the left side. Psychiatric:        Mood and Affect: Mood normal.     Physical Exam         Assessment & Plan:  Encounter for annual general medical examination with abnormal findings in adult Assessment & Plan: Immunizations UTD. Influenza vaccine provided today.  Pap smear UTD. Mammogram due, orders placed.  Discussed the importance of a healthy diet and regular exercise in order for weight loss, and to reduce the risk of further co-morbidity.  Exam stable. Labs pending.  Follow up in 1 year for repeat physical.    Mild intermittent asthma without complication Assessment & Plan: Controlled.  No concerns today. New albuterol  prescription provided.   Will add food allergy panel given symptoms of wheezing with shrimp.  She may need EpiPen.  Orders: -     Albuterol  Sulfate HFA; Inhale 2 puffs into the lungs every 6 (six) hours as needed for shortness of breath.  Dispense: 1 each; Refill: 0 -     Food Allergy Profile  HSV-2 (herpes simplex virus 2) infection Assessment & Plan: No breakouts.   Continue to monitor.    Anxiety and depression Assessment & Plan: No concerns today.  Continue to monitor.    Chronic neck pain Assessment & Plan: Likely MSK, could be early arthritis.  Will obtain plain films of the cervical spine today. No alarm signs on exam.  Start cyclobenzaprine  5 mg 3 times daily as needed.  Drowsiness precautions provided.  Orders: -     DG Cervical Spine Complete -     Cyclobenzaprine  HCl;  Take 1 tablet (5 mg total) by mouth 3 (three) times daily as needed for muscle spasms.  Dispense: 15 tablet; Refill: 0  Screening mammogram for breast cancer -     3D Screening Mammogram, Left and Right; Future  Essential hypertension Assessment & Plan: Stable overall. Commended her on regular exercise.  Continue amlodipine  10 mg daily.   Gastroesophageal reflux disease, unspecified whether esophagitis present Assessment & Plan: No concerns today. Remain off omeprazole .   Morbid obesity with BMI of 40.0-44.9, adult [facility]) Assessment &  Plan: Following with bariatric surgery, office notes and labs reviewed from September 2025 through Care Everywhere.   Borderline diabetes Assessment & Plan: Reviewed A1c from May 2025 through Care Everywhere. Repeat A1c pending.  Orders: -     Hemoglobin A1c  History of syphilis Assessment & Plan: Repeat titers ordered and pending.  Orders: -     RPR (MONITOR) W/REFL  Encounter for immunization -     Flu vaccine trivalent PF, 6mos and older(Flulaval,Afluria,Fluarix,Fluzone)    Assessment and Plan Assessment & Plan         Comer MARLA Gaskins, NP    History of Present Illness

## 2024-05-10 NOTE — Assessment & Plan Note (Signed)
 Repeat titers ordered and pending.

## 2024-05-10 NOTE — Assessment & Plan Note (Addendum)
 Controlled.  No concerns today. New albuterol  prescription provided.   Will add food allergy panel given symptoms of wheezing with shrimp.  She may need EpiPen.

## 2024-05-10 NOTE — Assessment & Plan Note (Signed)
 Likely MSK, could be early arthritis.  Will obtain plain films of the cervical spine today. No alarm signs on exam.  Start cyclobenzaprine  5 mg 3 times daily as needed.  Drowsiness precautions provided.

## 2024-05-10 NOTE — Assessment & Plan Note (Signed)
 No breakouts.   Continue to monitor.

## 2024-05-10 NOTE — Assessment & Plan Note (Signed)
 No concerns today. Remain off omeprazole .

## 2024-05-10 NOTE — Assessment & Plan Note (Signed)
 No concerns today. Continue to monitor.

## 2024-05-10 NOTE — Assessment & Plan Note (Signed)
 Reviewed A1c from May 2025 through Care Everywhere. Repeat A1c pending.

## 2024-05-13 ENCOUNTER — Encounter: Payer: Self-pay | Admitting: Primary Care

## 2024-05-14 MED ORDER — OZEMPIC (0.25 OR 0.5 MG/DOSE) 2 MG/3ML ~~LOC~~ SOPN: PEN_INJECTOR | SUBCUTANEOUS | 0 refills | Status: DC

## 2024-05-14 NOTE — Telephone Encounter (Signed)
 Copied from CRM [id number]. Topic: Clinical - Lab/Test Results >> May 13, 2024  4:53 PM Debby BROCKS wrote: Reason for CRM: Patient received a call from Gisselle Galvis C, CMA. She is available now to speak about the information

## 2024-05-14 NOTE — Telephone Encounter (Signed)
 Copied from CRM #[id number]. Topic: General - Other >> May 14, 2024 11:38 AM Macario HERO wrote: Reason for CRM: Patient called regarding missed call from [facility]. Read notes to patient and she said she prefers Mounjaro  over Ozempic . Also requesting a follow up call.

## 2024-05-16 DIAGNOSIS — E1165 Type 2 diabetes mellitus with hyperglycemia: Secondary | ICD-10-CM

## 2024-05-16 LAB — FOOD ALLERGY PROFILE

## 2024-05-16 LAB — INTERPRETATION:

## 2024-05-16 LAB — RPR (MONITOR) W/REFL: RPR (Monitor) w/refl Titer: NONREACTIVE

## 2024-05-16 MED ORDER — TIRZEPATIDE 2.5 MG/0.5ML ~~LOC~~ SOAJ: 2.5000 mg | SUBCUTANEOUS | 0 refills | Status: DC

## 2024-05-31 ENCOUNTER — Other Ambulatory Visit: Payer: Self-pay | Admitting: Primary Care

## 2024-05-31 DIAGNOSIS — I10 Essential (primary) hypertension: Secondary | ICD-10-CM

## 2024-05-31 MED ORDER — AMLODIPINE BESYLATE 10 MG PO TABS: 10.0000 mg | ORAL_TABLET | Freq: Every day | ORAL | 2 refills | Status: AC

## 2024-05-31 NOTE — Telephone Encounter (Signed)
 Please call patient:  Received refill request for amlodipine  BP pill for a [facility] in [place] . Does she need it in [place] ?

## 2024-05-31 NOTE — Telephone Encounter (Signed)
 Spoke with patient, she need it sent to [facility] on [address] [place].

## 2024-06-05 NOTE — Telephone Encounter (Signed)
 Can we check on the Mounjaro  PA?  She was originally prescribed Ozempic  which was approved but she changed her mind and wanted to try Mounjaro  instead.  Not sure if she needs to try Ozempic  first.

## 2024-06-06 ENCOUNTER — Telehealth: Payer: Self-pay

## 2024-06-06 ENCOUNTER — Other Ambulatory Visit (HOSPITAL_COMMUNITY): Payer: Self-pay

## 2024-06-06 DIAGNOSIS — E1165 Type 2 diabetes mellitus with hyperglycemia: Secondary | ICD-10-CM

## 2024-06-06 NOTE — Telephone Encounter (Signed)
 Pharmacy Patient Advocate Encounter  Received notification from [employer] that Prior Authorization for Mounjaro  2.5 has been APPROVED from 06/06/24 to 06/06/25. Ran test claim, Copay is $25.00. This test claim was processed through [facility]- copay amounts may vary at other pharmacies due to pharmacy/plan contracts, or as the patient moves through the different stages of their insurance plan.   PA #/Case ID/Reference #: # [id number]

## 2024-06-06 NOTE — Telephone Encounter (Signed)
 Pharmacy Patient Advocate Encounter   Received notification from Pt Calls Messages that prior authorization for Mounjaro  2.5 is required/requested.   Insurance verification completed.   The patient is insured through [facility].   Per test claim: PA required; PA submitted to above mentioned insurance via Latent Key/confirmation #/EOC [id number] Status is pending

## 2024-07-01 MED ORDER — MOUNJARO 5 MG/0.5ML ~~LOC~~ SOAJ: 5.0000 mg | SUBCUTANEOUS | 0 refills | Status: DC

## 2024-07-01 NOTE — Addendum Note (Signed)
 Addended by: Jadian Karman K on: 07/01/2024 05:32 PM   Modules accepted: Orders

## 2024-07-02 ENCOUNTER — Other Ambulatory Visit: Payer: Self-pay | Admitting: Primary Care

## 2024-07-02 DIAGNOSIS — E1165 Type 2 diabetes mellitus with hyperglycemia: Secondary | ICD-10-CM

## 2024-08-13 ENCOUNTER — Ambulatory Visit: Payer: Self-pay | Admitting: Primary Care

## 2024-08-13 ENCOUNTER — Encounter: Payer: Self-pay | Admitting: Primary Care

## 2024-08-13 ENCOUNTER — Ambulatory Visit (INDEPENDENT_AMBULATORY_CARE_PROVIDER_SITE_OTHER): Payer: Self-pay | Admitting: Primary Care

## 2024-08-13 VITALS — BP 122/72 | HR 79 | Temp 98.6°F | Ht 61.0 in | Wt 213.4 lb

## 2024-08-13 DIAGNOSIS — E1165 Type 2 diabetes mellitus with hyperglycemia: Secondary | ICD-10-CM

## 2024-08-13 DIAGNOSIS — Z7985 Long-term (current) use of injectable non-insulin antidiabetic drugs: Secondary | ICD-10-CM | POA: Diagnosis not present

## 2024-08-13 LAB — MICROALBUMIN / CREATININE URINE RATIO
Creatinine,U: 291.9 mg/dL
Microalb Creat Ratio: 7.6 mg/g (ref 0.0–30.0)
Microalb, Ur: 2.2 mg/dL — ABNORMAL HIGH (ref 0.7–1.9)

## 2024-08-13 LAB — POCT GLYCOSYLATED HEMOGLOBIN (HGB A1C): Hemoglobin A1C: 5.5 % (ref 4.0–5.6)

## 2024-08-13 MED ORDER — OZEMPIC (0.25 OR 0.5 MG/DOSE) 2 MG/3ML ~~LOC~~ SOPN: PEN_INJECTOR | SUBCUTANEOUS | 0 refills | Status: AC

## 2024-08-13 MED ORDER — BLOOD GLUCOSE MONITORING SUPPL DEVI: 1.0000 | 0 refills | Status: AC

## 2024-08-13 MED ORDER — LANCET DEVICE MISC: 1.0000 | 0 refills | Status: AC

## 2024-08-13 MED ORDER — LANCETS MISC: 3 refills | Status: AC

## 2024-08-13 MED ORDER — BLOOD GLUCOSE TEST VI STRP: ORAL_STRIP | 3 refills | Status: AC

## 2024-08-13 NOTE — Progress Notes (Signed)
 "  Subjective:    Patient ID: Tina Wall, female    DOB: 11-01-1980, 43 y.o.   MRN: [id number]  Tina Wall is a very pleasant 43 y.o. female with a history of type 2 diabetes, hypertension who presents today for follow-up of diabetes  1) Type 2 Diabetes:  Current medications include: Mounjaro  5 mg weekly. She has noticed itching, warmth, and swelling to the injection site when she began with the 5 mg dose. She denies chest tightness, wheezing, GI upset.   She is checking her blood glucose _ times daily and is getting readings of  Last A1C: 6.5 in September 2025 Last Eye Exam: UTD Last Foot Exam: UTD Pneumonia Vaccination:Never completed  Urine Microalbumin: Due Statin: Pravastatin   Dietary changes since last visit: Increased consumption of salads, eating less overall, cutting back on fried foods and sweets.    Exercise: She recently started with a trainer.   Wt Readings from Last 3 Encounters:  08/13/24 213 lb 6 oz (96.8 kg)  05/10/24 230 lb (104.3 kg)  07/29/23 227 lb (103 kg)       Review of Systems  Respiratory:  Negative for shortness of breath and wheezing.   Cardiovascular:  Negative for chest pain.  Gastrointestinal:  Negative for constipation, diarrhea and nausea.  Skin:  Positive for color change.         Past Medical History:  Diagnosis Date   Asthma    Bronchitis    Diabetes mellitus without complication (HCC)    Fibroid 05/23/2016   Lt subserosal 2.4x2.3x2.5 @ [redacted]wks     Gestational diabetes    GI bleeding    Headache    Heart murmur    History of gestational diabetes 05/27/2016   Dx @ 9wks    A1C: 5.7  Glyburide  5mg  BID    2hr pp GTT: 105/170/144   A1C:___   HSV-2 (herpes simplex virus 2) infection    Hx of chlamydia infection    Hx of trichomoniasis    Hypertension    Ovarian cyst    Persistent cough for 3 weeks or longer 01/07/2022   Previous cesarean section 05/23/2016   Vag then c/s x 3  1st: FTP @ 6cm, 2nd & 3rd: repeat       Syphilis    Tendonitis 04/19/2022    Social History   Socioeconomic History   Marital status: Unknown    Spouse name: Not on file   Number of children: Not on file   Years of education: Not on file   Highest education level: Not on file  Occupational History   Not on file  Tobacco Use   Smoking status: Never   Smokeless tobacco: Never  Vaping Use   Vaping status: Never Used  Substance and Sexual Activity   Alcohol use: Yes    Comment: occ   Drug use: Never   Sexual activity: Not Currently    Partners: Male    Birth control/protection: None  Other Topics Concern   Not on file  Social History Narrative   ** Merged History Encounter **       Social Drivers of Health   Tobacco Use: Low Risk (08/13/2024)   Patient History    Smoking Tobacco Use: Never    Smokeless Tobacco Use: Never    Passive Exposure: Not on file  Financial Resource Strain: Medium Risk (01/17/2024)   Received from [facility]   Overall Financial Resource Strain (CARDIA)    Difficulty of Paying Living Expenses:  Somewhat hard  Food Insecurity: Food Insecurity Present (01/17/2024)   Received from [facility]   Epic    Within the past 12 months, you worried that your food would run out before you got the money to buy more.: Sometimes true    Within the past 12 months, the food you bought just didn't last and you didn't have money to get more.: Sometimes true  Transportation Needs: No Transportation Needs (01/17/2024)   Received from [facility]   PRAPARE - Transportation    Lack of Transportation (Medical): No    Lack of Transportation (Non-Medical): No  Physical Activity: Insufficiently Active (01/17/2024)   Received from [facility]   Exercise Vital Sign    On average, how many days per week do you engage in moderate to strenuous exercise (like a brisk walk)?: 2 days    On average, how many minutes do you engage in exercise at this level?: 60 min  Stress: Stress Concern Present (01/17/2024)    Received from [facility] of Occupational Health - Occupational Stress Questionnaire    Feeling of Stress : To some extent  Social Connections: Somewhat Isolated (01/17/2024)   Received from [facility]   Social Network    How would you rate your social network (family, work, friends)?: Restricted participation with some degree of social isolation  Intimate Partner Violence: Not At Risk (01/17/2024)   Received from [facility]   HITS    Over the last 12 months how often did your partner physically hurt you?: Never    Over the last 12 months how often did your partner insult you or talk down to you?: Sometimes    Over the last 12 months how often did your partner threaten you with physical harm?: Never    Over the last 12 months how often did your partner scream or curse at you?: Rarely  Depression (PHQ2-9): Low Risk (05/10/2024)   Depression (PHQ2-9)    PHQ-2 Score: 0  Alcohol Screen: Not on file  Housing: High Risk (01/17/2024)   Received from [facility]    In the last 12 months, was there a time when you were not able to pay the mortgage or rent on time?: Yes    In the past 12 months, how many times have you moved where you were living?: 0    At any time in the past 12 months, were you homeless or living in a shelter (including now)?: No  Utilities: Not At Risk (01/17/2024)   Received from [facility] Utilities    Threatened with loss of utilities: No  Health Literacy: Not on file    Past Surgical History:  Procedure Laterality Date   CESAREAN SECTION     C/S x 2   CESAREAN SECTION N/A 12/19/2016   Procedure: REPEAT CESAREAN SECTION;  Surgeon: Vonn VEAR Inch, MD;  Location: [facility] BIRTHING SUITES;  Service: Obstetrics;  Laterality: N/A;   COSMETIC SURGERY     FOOT SURGERY     LIPOSUCTION     TONSILLECTOMY     WISDOM TOOTH EXTRACTION      Family History  Problem Relation Age of Onset   Hypertension Mother    Diabetes Mother    Kidney  disease Mother    Cancer Father        trachea    Breast cancer Maternal Aunt    Alzheimer's disease Paternal Grandmother     Allergies[1]  Medications  Ordered Prior to Encounter[2]  BP 122/72   Pulse 79   Temp 98.6 F (37 C) (Oral)   Ht 5' 1 (1.549 m)   Wt 213 lb 6 oz (96.8 kg)   SpO2 96%   BMI 40.32 kg/m  Objective:   Physical Exam Cardiovascular:     Rate and Rhythm: Normal rate and regular rhythm.  Pulmonary:     Effort: Pulmonary effort is normal.     Breath sounds: Normal breath sounds.  Musculoskeletal:     Cervical back: Neck supple.  Skin:    General: Skin is warm and dry.  Neurological:     Mental Status: She is alert and oriented to person, place, and time.  Psychiatric:        Mood and Affect: Mood normal.     Physical Exam        Assessment & Plan:  Type 2 diabetes mellitus with hyperglycemia, without long-term current use of insulin  (HCC) Assessment & Plan: Improved and well controlled with A1C of 5.5 today!  It seems like she has an allergic reaction to Mounjaro  so we will discontinue. Start semaglutide  (Ozempic ) for diabetes. Start by injecting 0.25 mg into the skin once weekly for 4 weeks, then increase to 0.5 mg once weekly thereafter.   She will update if she develops a reaction to Ozempic .  Urine microalbumin pending.  Glucometer kit sent to pharmacy.  Discussed instructions for testing  Follow up in 3 months.    Orders: -     POCT glycosylated hemoglobin (Hb A1C) -     Ozempic  (0.25 or 0.5 MG/DOSE); Inject 0.25 mg into the skin once weekly for 4 weeks, then increase to 0.5 mg once weekly thereafter for diabetes.  Dispense: 6 mL; Refill: 0 -     Microalbumin / creatinine urine ratio -     Blood Glucose Monitoring Suppl; 1 each by Does not apply route as directed. Dispense based on patient and insurance preference. Use up to four times daily as directed. (FOR ICD-10 E10.9, E11.9).  Dispense: 1 each; Refill: 0 -     Blood Glucose  Test; Used to check blood sugar once daily. dispense based on patient and insurance preference.  Dispense: 100 strip; Refill: 3 -     Lancet Device; 1 each by Does not apply route as directed. Dispense based on patient and insurance preference. Use up to four times daily as directed. (FOR ICD-10 E10.9, E11.9).  Dispense: 1 each; Refill: 0 -     Lancets; Used to check blood sugar once daily.  Dispense based on patient and insurance preference.  Dispense: 100 each; Refill: 3    Assessment and Plan Assessment & Plan         Comer MARLA Gaskins, NP       [1]  Allergies Allergen Reactions   Ace Inhibitors Shortness Of Breath    Short of breath   Ace Inhibitors    Latex Other (See Comments)    Causes irritation.   Latex   [2]  Current Outpatient Medications on File Prior to Visit  Medication Sig Dispense Refill   albuterol  (VENTOLIN  HFA) 108 (90 Base) MCG/ACT inhaler Inhale 2 puffs into the lungs every 6 (six) hours as needed for shortness of breath. 1 each 0   amLODipine  (NORVASC ) 10 MG tablet Take 1 tablet (10 mg total) by mouth daily. for blood pressure 90 tablet 2   ascorbic acid (VITAMIN C) 1000 MG tablet Take 1,000 mg by mouth.  aspirin  EC 81 MG tablet Take 81 mg by mouth.     Cholecalciferol 1.25 MG (50000 UT) TABS Take 2,000 Units by mouth.     cyclobenzaprine  (FLEXERIL ) 5 MG tablet Take 1 tablet (5 mg total) by mouth 3 (three) times daily as needed for muscle spasms. 15 tablet 0   levonorgestrel  (MIRENA ) 20 MCG/DAY IUD 1 each by Intrauterine route.     pravastatin (PRAVACHOL) 40 MG tablet Take 40 mg by mouth.     No current facility-administered medications on file prior to visit.   "

## 2024-08-13 NOTE — Patient Instructions (Signed)
 Start checking your blood sugar levels.  Appropriate times to check your blood sugar levels are:  -Before any meal (breakfast, lunch, dinner) -Two hours after any meal (breakfast, lunch, dinner) -Bedtime  Record your readings and notify me if you continue to consistently run at or above 130.   Stop taking Mounjaro  for diabetes  Start semaglutide  (Ozempic ) for diabetes. Start by injecting 0.25 mg into the skin once weekly for 4 weeks, then increase to 0.5 mg once weekly thereafter.   Please schedule a follow up visit for 3 months.  It was a pleasure to see you today!

## 2024-08-13 NOTE — Assessment & Plan Note (Addendum)
 Improved and well controlled with A1C of 5.5 today!  It seems like she has an allergic reaction to Mounjaro  so we will discontinue. Start semaglutide  (Ozempic ) for diabetes. Start by injecting 0.25 mg into the skin once weekly for 4 weeks, then increase to 0.5 mg once weekly thereafter.   She will update if she develops a reaction to Ozempic .  Urine microalbumin pending.  Glucometer kit sent to pharmacy.  Discussed instructions for testing  Follow up in 3 months.

## 2024-09-16 ENCOUNTER — Telehealth: Payer: Self-pay

## 2024-09-16 NOTE — Telephone Encounter (Signed)
 Copied from CRM #[id number]. Topic: Clinical - Prescription Issue >> Sep 16, 2024 11:58 AM Deleta RAMAN wrote: Reason for CRM: patient Semaglutide ,0.25 or 0.5MG /DOS, (OZEMPIC , 0.25 OR 0.5 MG/DOSE,) 2 MG/3ML SOPN was not filled as told by pharamcy. Last sent on 12/23 patient has not received medication sent from 08/13/24. [phone]

## 2024-09-17 NOTE — Telephone Encounter (Signed)
 Spoke with [facility] [address] asking about Ozempic  rx. States it needs PA.   Pls submit PA for Ozempic  0.25 mg and 0.5 mg.

## 2024-09-17 NOTE — Telephone Encounter (Signed)
 Can we get some clarification?  Was the [facility] prescription not filled because of prior authorization/insurance rejection?  Order the pharmacy not receive my prescription?

## 2024-09-18 ENCOUNTER — Other Ambulatory Visit: Payer: Self-pay | Admitting: Primary Care

## 2024-09-18 DIAGNOSIS — E1165 Type 2 diabetes mellitus with hyperglycemia: Secondary | ICD-10-CM

## 2024-09-19 ENCOUNTER — Other Ambulatory Visit (HOSPITAL_COMMUNITY): Payer: Self-pay

## 2024-09-19 ENCOUNTER — Telehealth: Payer: Self-pay

## 2024-09-19 NOTE — Telephone Encounter (Signed)
 Pharmacy Patient Advocate Encounter   Received notification from [facility] KEY that prior authorization for Ozempic  2 is required/requested.   Insurance verification completed.   The patient is insured through [facility].   Per test claim: PA required; PA submitted to above mentioned insurance via Latent Key/confirmation #/EOC [id number] Status is pending

## 2024-09-20 ENCOUNTER — Other Ambulatory Visit (HOSPITAL_COMMUNITY): Payer: Self-pay

## 2024-09-20 NOTE — Telephone Encounter (Signed)
 Pharmacy Patient Advocate Encounter  Received notification from [employer] that Prior Authorization for Ozempic  2 has been APPROVED from 09/19/24 to 09/19/25   PA #/Case ID/Reference #: # [id number]

## 2024-09-24 ENCOUNTER — Other Ambulatory Visit (HOSPITAL_COMMUNITY): Payer: Self-pay

## 2024-11-12 ENCOUNTER — Ambulatory Visit: Admitting: Primary Care
